# Patient Record
Sex: Male | Born: 1945
Health system: Southern US, Community
[De-identification: ages and names within clinical notes are randomized; demographics above are authoritative.]

## PROBLEM LIST (undated history)

## (undated) DIAGNOSIS — K219 Gastro-esophageal reflux disease without esophagitis: Secondary | ICD-10-CM

## (undated) DIAGNOSIS — I10 Essential (primary) hypertension: Secondary | ICD-10-CM

## (undated) DIAGNOSIS — M2540 Effusion, unspecified joint: Secondary | ICD-10-CM

## (undated) DIAGNOSIS — Z860100 Personal history of colon polyps, unspecified: Secondary | ICD-10-CM

## (undated) DIAGNOSIS — R531 Weakness: Secondary | ICD-10-CM

## (undated) DIAGNOSIS — I499 Cardiac arrhythmia, unspecified: Secondary | ICD-10-CM

## (undated) DIAGNOSIS — Z9889 Other specified postprocedural states: Secondary | ICD-10-CM

## (undated) DIAGNOSIS — E785 Hyperlipidemia, unspecified: Secondary | ICD-10-CM

## (undated) DIAGNOSIS — K5900 Constipation, unspecified: Secondary | ICD-10-CM

## (undated) DIAGNOSIS — K5790 Diverticulosis of intestine, part unspecified, without perforation or abscess without bleeding: Secondary | ICD-10-CM

## (undated) DIAGNOSIS — L853 Xerosis cutis: Secondary | ICD-10-CM

## (undated) DIAGNOSIS — Z9289 Personal history of other medical treatment: Secondary | ICD-10-CM

## (undated) DIAGNOSIS — Z8719 Personal history of other diseases of the digestive system: Secondary | ICD-10-CM

## (undated) DIAGNOSIS — F419 Anxiety disorder, unspecified: Secondary | ICD-10-CM

## (undated) DIAGNOSIS — M2550 Pain in unspecified joint: Secondary | ICD-10-CM

## (undated) DIAGNOSIS — R112 Nausea with vomiting, unspecified: Secondary | ICD-10-CM

## (undated) DIAGNOSIS — E271 Primary adrenocortical insufficiency: Secondary | ICD-10-CM

## (undated) DIAGNOSIS — M069 Rheumatoid arthritis, unspecified: Secondary | ICD-10-CM

## (undated) HISTORY — PX: COLONOSCOPY WITH ESOPHAGOGASTRODUODENOSCOPY (EGD) AND ESOPHAGEAL DILATION (ED): SHX6495

## (undated) HISTORY — PX: TOTAL HIP ARTHROPLASTY: SHX124

## (undated) HISTORY — PX: EYE SURGERY: SHX253

## (undated) HISTORY — PX: COLONOSCOPY: SHX174

---

## 1977-04-24 HISTORY — PX: VASECTOMY: SHX75

## 1982-04-24 HISTORY — PX: TOTAL KNEE ARTHROPLASTY: SHX125

## 1999-08-16 ENCOUNTER — Encounter: Admission: RE | Admit: 1999-08-16 | Discharge: 1999-08-16 | Payer: Self-pay | Admitting: Internal Medicine

## 1999-08-16 ENCOUNTER — Encounter: Payer: Self-pay | Admitting: Internal Medicine

## 1999-10-23 ENCOUNTER — Emergency Department (HOSPITAL_COMMUNITY): Admission: EM | Admit: 1999-10-23 | Discharge: 1999-10-24 | Payer: Self-pay | Admitting: Emergency Medicine

## 1999-10-23 ENCOUNTER — Encounter: Payer: Self-pay | Admitting: Emergency Medicine

## 1999-12-15 ENCOUNTER — Inpatient Hospital Stay (HOSPITAL_COMMUNITY): Admission: RE | Admit: 1999-12-15 | Discharge: 1999-12-18 | Payer: Self-pay | Admitting: Internal Medicine

## 2001-04-03 ENCOUNTER — Inpatient Hospital Stay (HOSPITAL_COMMUNITY): Admission: EM | Admit: 2001-04-03 | Discharge: 2001-04-05 | Payer: Self-pay | Admitting: Emergency Medicine

## 2001-04-03 ENCOUNTER — Encounter: Payer: Self-pay | Admitting: Emergency Medicine

## 2001-07-20 ENCOUNTER — Emergency Department (HOSPITAL_COMMUNITY): Admission: EM | Admit: 2001-07-20 | Discharge: 2001-07-20 | Payer: Self-pay | Admitting: Emergency Medicine

## 2001-07-20 ENCOUNTER — Encounter: Payer: Self-pay | Admitting: Emergency Medicine

## 2002-11-04 ENCOUNTER — Encounter: Admission: RE | Admit: 2002-11-04 | Discharge: 2002-11-04 | Payer: Self-pay | Admitting: Internal Medicine

## 2002-11-04 ENCOUNTER — Encounter: Payer: Self-pay | Admitting: Internal Medicine

## 2002-12-03 ENCOUNTER — Encounter: Admission: RE | Admit: 2002-12-03 | Discharge: 2002-12-03 | Payer: Self-pay | Admitting: Internal Medicine

## 2002-12-03 ENCOUNTER — Encounter: Payer: Self-pay | Admitting: Internal Medicine

## 2002-12-11 ENCOUNTER — Encounter: Payer: Self-pay | Admitting: Emergency Medicine

## 2002-12-12 ENCOUNTER — Inpatient Hospital Stay (HOSPITAL_COMMUNITY): Admission: EM | Admit: 2002-12-12 | Discharge: 2002-12-13 | Payer: Self-pay | Admitting: Emergency Medicine

## 2003-03-28 ENCOUNTER — Inpatient Hospital Stay (HOSPITAL_COMMUNITY): Admission: EM | Admit: 2003-03-28 | Discharge: 2003-04-01 | Payer: Self-pay | Admitting: Emergency Medicine

## 2004-05-24 ENCOUNTER — Emergency Department (HOSPITAL_COMMUNITY): Admission: EM | Admit: 2004-05-24 | Discharge: 2004-05-24 | Payer: Self-pay | Admitting: Emergency Medicine

## 2004-06-11 ENCOUNTER — Ambulatory Visit (HOSPITAL_COMMUNITY): Admission: RE | Admit: 2004-06-11 | Discharge: 2004-06-11 | Payer: Self-pay | Admitting: Internal Medicine

## 2004-06-12 ENCOUNTER — Emergency Department (HOSPITAL_COMMUNITY): Admission: EM | Admit: 2004-06-12 | Discharge: 2004-06-12 | Payer: Self-pay | Admitting: Emergency Medicine

## 2004-07-11 ENCOUNTER — Emergency Department (HOSPITAL_COMMUNITY): Admission: EM | Admit: 2004-07-11 | Discharge: 2004-07-11 | Payer: Self-pay | Admitting: Emergency Medicine

## 2004-08-03 ENCOUNTER — Ambulatory Visit (HOSPITAL_COMMUNITY): Admission: RE | Admit: 2004-08-03 | Discharge: 2004-08-03 | Payer: Self-pay | Admitting: Internal Medicine

## 2007-11-13 ENCOUNTER — Encounter: Admission: RE | Admit: 2007-11-13 | Discharge: 2007-11-13 | Payer: Self-pay | Admitting: Internal Medicine

## 2008-07-09 ENCOUNTER — Emergency Department (HOSPITAL_COMMUNITY): Admission: EM | Admit: 2008-07-09 | Discharge: 2008-07-09 | Payer: Self-pay | Admitting: Emergency Medicine

## 2010-05-15 ENCOUNTER — Encounter: Payer: Self-pay | Admitting: Internal Medicine

## 2010-09-09 NOTE — Discharge Summary (Signed)
NAME:  John, Lyons                          ACCOUNT NO.:  [id number]   MEDICAL RECORD NO.:  [id number]                   PATIENT TYPE:  INP   LOCATION:  0484                                 FACILITY:  [facility]   PHYSICIAN:  John Lyons, M.D.                DATE OF BIRTH:  06-16-1945   DATE OF ADMISSION:  12/11/2002  DATE OF DISCHARGE:  12/13/2002                                 DISCHARGE SUMMARY   DISCHARGE DIAGNOSES:  1. Nausea and vomiting, possibly due to a viral syndrome.  2. Significant hyponatremia requiring admission, corrected at time of     discharge.  3. Long-standing rheumatoid arthritis.  4. History of osteoporosis.  5. History of diverticulosis.  6. History of multiple small gallstones diagnosed by ultrasound in July     2004, with no evidence of this contributing to his difficulties at this     time.  7. Hypertension.  8. Past history of episodes of vomiting requiring Reglan treatment and     proton pump inhibitor in the past.   PROCEDURE:  None.   DISCHARGE MEDICATIONS:  John Lyons is to resume all of his previous home  medications with no new medications at the time of discharge.  This includes  Plaquenil 200 mg daily, digoxin 0.125 mg daily, Xanax 0.25 mg as needed for  anxiety or insomnia, Celebrex 200 mg daily, multivitamins daily, Caltrate  with D one tablet t.i.d., Aciphex 20 mg daily, folate 1 mg daily, Diovan 80  mg daily, Nasacort 2 sprays each nostril daily.   HISTORY OF PRESENT ILLNESS:  John Lyons is a pleasant 65 year old male who  complained of rhinitis for several days.  He developed nausea and vomiting  on December 09, 2002.  He developed persistent nausea and vomiting that did  not remit.  On December 10, 2002, he was called in Zithromax and Robitussin  for possible sinus infection; however, he continued to have nausea and  vomiting.  He denied any fever, constipation, diarrhea, or abdominal pain.  He did have a mild headache.  On presentation to  the emergency room, he was  found to be significantly hyponatremic with a sodium of 117.  He was  therefore admitted for further treatment.   HOSPITAL COURSE:  John Lyons was admitted.  He was hydrated gently with  intravenous normal saline.  He was treated with intravenous Zofran as needed  for nausea and vomiting.  He was placed on IV proton pump inhibitor therapy.  By later in the day on December 12, 2002, he had felt significantly improved.  He had no recurrent episodes of nausea and vomiting.  By December 13, 2002, he  was deemed stable for discharge home.   DISCHARGE PHYSICAL EXAMINATION:  VITAL SIGNS:  The patient's vital signs  were stable.  ABDOMEN:  Benign.  GENERAL:  He was in no acute distress.   LABORATORY DATA:  On  December 13, 2002, his white count was 2.8, hemoglobin  14.3, platelet count 258,000, with 53% segs, 32% lymphocytes, 12% monocytes.  On December 13, 2002, sodium had increased to 130, potassium 4.2, chloride  100, CO2 of 26, BUN 2, creatinine 0.5, glucose 86, calcium 8.6, total  protein 5.8, albumin 3.4.  AST 19, ALT 15, alkaline phosphatase 56, total  bilirubin 1.1.  Amylase was normal on admission at 89.  Troponin-I was  normal at 0.01 on admission.  Acute abdominal series was negative upon  admission as well.   DISCHARGE INSTRUCTIONS:  John Lyons is to call if he has any recurrent  problems, and continue his routine followup with [place] and his rheumatologist.                                                Loraine Leriche A. Waynard Edwards, M.D.    MAP/MEDQ  D:  12/16/2002  T:  12/16/2002  Job:  [id number]   cc:   Lemmie Evens, M.D.  [address]  [place]  [place] [address]  Fax: [phone]

## 2010-09-09 NOTE — Discharge Summary (Signed)
[facility]  Patient:    John Lyons, John Lyons Visit Number: [id number] MRN: [id number]          Service Type: MED Location: 3W 0345 02 Attending Physician:  Ezequiel Kayser Dictated by:   Rodrigo Ran, M.D. Admit Date:  04/03/2001 Discharge Date: 04/05/2001   CC:         Lemmie Evens, M.D.   Discharge Summary  DISCHARGE DIAGNOSES: 1. Acute sinusitis with possible associated bronchitis. 2. Persistent nausea, vomiting, and dehydration with some component of    gastroparesis. 3. Long-standing rheumatoid arthritis. 4. History of diverticulosis. 5. Osteoporosis with history of intolerance of dysphosphonates. 6. Hyponatremia, resolving at the time of discharge. 7. Headache, improving at the time of discharge.  PROCEDURES:  None.  DISCHARGE MEDICATIONS:  1. Aciphex 20 mg p.o. q.h.s.  2. Lanoxin 0.25 mg one q.d. as before.  3. Xanax 0.5 mg 1/2 to 1 tablet q.h.s. p.r.n.  4. Celebrex 200 mg q.d.  5. Plaquenil 200 mg q.d.  6. Multivitamin q.d.  7. Vitamin E q.d.  8. Calcium with vitamin D q.d.  9. Tequin 400 mg q.d. x 5 further days. 10. Reglan 10 mg before each meal t.i.d. total. 11. Tylenol 500 mg one or two q.8h. p.r.n. 12. Phenergan suppositories p.r.n.  HISTORY OF PRESENT ILLNESS:  [name] is a very pleasant 65 year old male with long-standing rheumatoid arthritis and history of an admission approximately one year ago for nausea and vomiting with gastroparesis type symptoms.  He has done well over the last year, and did not require regular use of Reglan. However, two weeks prior to admission he developed fever, cough, congestion, had some weakness with mild shortness of breath.  He was treated with 10 days of Augmentin with some significant improvement.  However, 48 hours prior to admission his symptoms returned with recurrent cough, and then 24 hours prior to admission he developed significant nausea and vomiting with multiple episodes of  vomiting.  He presented to the emergency room and was found to be clinically dehydrated and hyponatremic, and required admission.  HOSPITAL COURSE:  [name] was admitted to a regular hospital bed.  He was treated empirically with intravenous Tequin for a possible bronchitis or sinusitis as his initial chest x-ray did not show any acute disease.  He was also treated with intravenous Reglan and Phenergan p.r.n., and was placed on a clear liquid diet and was hydrated with normal saline.  [name] progressed well, and by the second day of hospitalization he was starting to take some oral intake.  His sodium improved to 129 from admission of 121, by the second day of hospitalization.  He did have some headache which was treated with Tylenol.  By 04/05/01, [name] was taken p.o., he was ambulating, his headache had decreased, and he was deemed stable for discharge home for continued outpatient management.  DISCHARGE PHYSICAL EXAMINATION:  VITAL SIGNS:  Temperature max was 100.1, pulse 86, respiratory rate 20, blood pressure 95/55, 95% saturation on room air.  GENERAL:  He was in bed semisupine in no acute distress.  HEENT:  Pupils are equal, round and reactive to light.  Extraocular movements intact.  There was no icterus.  LUNGS:  Clear to auscultation bilaterally.  HEART:  Regular with no murmurs, rubs, or gallops.  There was no edema.  ABDOMEN:  Soft and nontender.  LABORATORY DATA:  From 04/04/01, showed a sodium of 129, potassium 3.4, chloride 101, CO2 21, BUN 3, creatinine 0.5, glucose 76,  calcium 8.3, phosphorus 3.3, magnesium 1.6, and digoxin level of 0.2.  ACTIVITY:  [name] is to be up as tolerated.  DIET:  Regular.  He is to call if he has any recurrent problems.  FOLLOWUP:  He is to call our office for a post hospitalization followup in four weeks. Dictated by:   Rodrigo Ran, M.D. Attending Physician:  Ezequiel Kayser DD:  04/05/01 TD:  04/05/01 Job:  [id number]

## 2010-09-09 NOTE — H&P (Signed)
[facility]  Patient:    John Lyons, John Lyons Visit Number: [id number] MRN: [id number]          Service Type: MED Location: 1E 0105 01 Attending Physician:  Osvaldo Human Dictated by:   Rodrigo Ran, M.D. Admit Date:  04/03/2001   CC:         Lemmie Evens, M.D.   History and Physical  RHEUMATOLOGIST:  Lemmie Evens, M.D.  CHIEF COMPLAINT:  Fever, cough, nausea, vomiting, and weakness.  HISTORY OF PRESENT ILLNESS:  John Lyons is a 65 year old male with longstanding rheumatoid arthritis and history significant for an admission approximately one year ago for nausea and vomiting with some associated gastroparesis. However, he has done well over the last year and has had no problems even without the need for regular use of Reglan.  Approximately two weeks ago, he developed a respiratory illness with fever, cough, head congestion, some weakness, and mild shortness of breath.  He was treated with 10 days of Augmentin with significant improvement.  However, his symptoms returned in the last 48 hours with recurrent cough.  Over the last 24 hours, he has developed significant nausea and multiple episodes of vomiting of bilious material.  He denies any blood from above or below.  He denies any chest pains or shortness of breath currently.  He did have temperatures up to 99.9 degrees last week, but none in the last few days.  Some Phenergan was called in yesterday, however, he felt more weak and presented to the emergency room early this morning.  He is noted to have signs of dehydration with a serum sodium of 121 and will require admission for further treatment.  PAST MEDICAL HISTORY: 1. Rheumatoid arthritis since age 35 with a history of bilateral hip    replacement and left knee replacement. 2. Admission in August of 2001 for nausea, vomiting, and probable    gastroparesis. 3. History of diverticulosis. 4. Osteoporosis, but intolerance of  bisphosphonates due to GI upset.  ALLERGIES:  CODEINE, PHENOBARBITAL, ZYRTEC dries him out, and ACTONEL and FOSAMAX cause GI upset.  MEDICATIONS IN THE EMERGENCY ROOM:  IV Phenergan and 1000 cc of normal saline.  HOME MEDICATIONS: 1. Aciphex 20 mg q.h.s. 2. Lanoxin 0.25 mg q.d. 3. Xanax 0.5 mg one-half to one tablet q.h.s. 4. Celebrex 200 mg q.d. 5. Plaquenil 200 mg q.d. 6. Augmentin which was completed two days prior to admission. 7. Multivitamin daily. 8. Vitamin E and calcium with D usually one or two tablets a day.  SOCIAL HISTORY:  He has been married since 52.  He has one daughter.  He works for [employer].  Despite his medical issues, he has always worked very Writer.  He has no history of tobacco, alcohol, or drug use history.  FAMILY HISTORY:  His father died at age 53 of possible colon cancer.   His mother died at age 50 of diabetes, coronary disease, and an MI.  He has one living brother.  He has two half-sisters who are alive.  His daughter is in good health.  REVIEW OF SYSTEMS:  Nausea for one week, but worse over the last two days. Furthermore as per the history of present illness.  He did have two episodes of diarrhea on Monday, but none yesterday.  PHYSICAL EXAMINATION:  Temperature 99.5 degrees, blood pressure 122/68, pulse 78, respiratory rate 20.  GENERAL APPEARANCE:  He is lying supine in no acute distress.  He appears a little pale  and weak.  NECK:  There is no JVD.  HEENT:  The mucosa is dry.  LUNGS:  There is some wheeze and crackle in the right base on inspiration, but otherwise the lung fields aer clear.  HEART:  Regular with no murmurs, rubs, or gallops.  ABDOMEN:  Soft, nontender, and nondistended with no mass or hepatosplenomegaly.  EXTREMITIES:  There is no peripheral edema.  NEUROLOGIC:  He is alert and oriented and does move all extremities x 4.  He is grossly neurologically intact.  LABORATORY  DATA:  The urinalysis is negative.  The white count is 8.4 with 83% segs, 11% lymphocytes, 6% monocytes, hemoglobin 14.0, and platelet count 401,000.  The lipase is 21.  The sodium is 121, potassium 3.8, chloride 90, CO2 19, BUN 11, creatinine 0.5, glucose 75, and calcium 9.2. LFTs are within normal limits.  The chest x-ray is reviewed and shows no acute disease.  ASSESSMENT AND PLAN:  A 65 year old male nausea and vomiting with bronchitis versus an early community-acquired pneumonia and subsequent hyponatremia.  He may be exhibiting some signs of gastroparesis at this time.  We will admit, hydrate with normal saline, treat with IV Reglan, Protonix, and Phenergan.  He may have a clear liquid diet.  We will follow up a digoxin level. Furthermore, we will place him on Tequin empirically and follow his fever curve and vital signs. Dictated by:   Rodrigo Ran, M.D. Attending Physician:  Osvaldo Human DD:  04/03/01 TD:  04/03/01 Job: [id number]

## 2010-09-09 NOTE — H&P (Signed)
NAME:  John Lyons, John Lyons                          ACCOUNT NO.:  [id number]   MEDICAL RECORD NO.:  [id number]                   PATIENT TYPE:  INP   LOCATION:  0484                                 FACILITY:  [facility]   PHYSICIAN:  Barry Dienes. Eloise Harman, M.D.            DATE OF BIRTH:  1945-09-18   DATE OF ADMISSION:  12/11/2002  DATE OF DISCHARGE:                                HISTORY & PHYSICAL   CHIEF COMPLAINT:  Nausea and vomiting.   HISTORY OF PRESENT ILLNESS:  The patient is a 65 year old white male with a  complaint of rhinitis for several days, associated with nausea and vomiting  on December 09, 2002.  Since that time, he has had persistent nausea and  vomiting.  On December 10, 2002, Zithromax was called in with Robitussin for a  presumed sinus infection.  However, he continued to have nausea and  vomiting.  His last regular meal was on December 09, 2002.  He denies recent  fever, constipation, diarrhea, abdominal pain or dysuria.  For the past few  days, he has had a moderately severe headache described as in the right  periorbital region that is somewhat pulsating and intensity 5/10.  The  headache has lasted approximately one day and has not been associated with  vision changes.   PAST MEDICAL HISTORY:  1. Rheumatoid arthritis since age 65 years.  2. August 2001, persistent nausea and vomiting that led to Reglan treatment     with a proton pump inhibitor.  3. Diverticulosis.  4. Osteoporosis.  5. July 2004, ultrasound showing multiple small gallstones without biliary     duct dilatation.  6. Hypertension.  7. Paroxysmal atrial fibrillation.   MEDICATIONS PRIOR TO ADMISSION:  1. Plaquenil 200 mg p.o. daily.  2. Digoxin 0.125 mg p.o. daily.  3. Xanax 0.25 mg p.o. daily p.r.n. anxiety or sleep.  4. Celebrex 200 mg p.o. daily.  5. Multivitamins -- one tab p.o. daily.  6. Caltrate-D 600 -- one tab p.o. t.i.d.  7. Aciphex 20 mg p.o. daily.  8. Folate 1 mg p.o. daily.  9. Diovan  80 mg p.o. daily.  10.      Nasacort two sprays, each nostril, once daily.   Of note, he has not had prednisone in several years.   ALLERGIES:  PHENOBARBITAL has been associated with a rash, ACTONEL and  FOSAMAX have been associated with GI irritation, ALLEGRA has been associated  with prostate irritation and LISINOPRIL has been noted to cause a dry cough.   PAST SURGICAL HISTORY:  1. Bilateral total hip replacements.  2. Left total knee replacement.  3. Vasectomy.   FAMILY HISTORY:  Father died at age 50 of complications of depression.  Mother died at age 524 of complications of diabetes mellitus.  He has one  brother and two sisters who are alive and well.  There are no close family  members who have had colon  cancer.   SOCIAL HISTORY:  He is married and has one daughter.  He has no history of  tobacco or alcohol abuse.  He is currently working in [employer], site.   REVIEW OF SYSTEMS:  Most significant for a mild right frontal headache  associated with photophobia.  He also complains of a dry cough recently  without fever and arthralgias affecting his right hip and wrist.  He denies  recent vision change, shortness of breath, chest pain, abdominal pain,  constipation, or diarrhea.   INITIAL PHYSICAL EXAMINATION:  VITAL SIGNS:  Blood pressure 139/83, pulse  93, respiratory rate 20, temperature 99.1, 100% pulse oxygen saturation on  room air.  GENERAL:  In general, he is a chronically ill-appearing white man who held a  towel over his head due to his photophobia.  NECK:  Neck was supple with a full range of motion.  HEENT:  Exam showed pupils equal, round and reactive to light and  accommodation.  Extraocular movements intact with no papilledema.  CHEST:  Chest was clear to auscultation.  HEART:  Heart had a regular rate and rhythm.  S1 and S2 are present without  murmur, gallop, or rub.  ABDOMEN:  Abdomen had normal bowel sounds with no hepatosplenomegaly or   tenderness.  RECTAL:  Rectal exam had a small amount of soft stool in the rectal vault.  EXTREMITIES:  Extremities were without cyanosis, clubbing or edema.  NEUROLOGICAL:  He is alert and oriented x3.  He answers questions well and  moves his four extremities normally.   LABORATORY STUDIES:  Acute abdomen series x-rays were within normal limits  with no evidence of ileus or bowel obstruction.   Serum sodium was 117, potassium 3.4, chloride 84, CO2 25, BUN 4, creatinine  0.4, glucose 93, serum albumin 4.1, SGOT 27, SGPT 15, serum amylase 85,  alkaline phosphatase 68, total bilirubin 1.4.   IMPRESSION AND PLAN:  1. Nausea:  This is likely due to moderately severe hyponatremia.     Alternatively, the hyponatremia could be secondary to persistent nausea     and vomiting with low intravascular fluid volume.  His symptoms could be     due to early biliary tract obstruction due to known cholelithiasis.     However, his lack of right upper quadrant tenderness and lack of worsened     symptoms with food intake argue against biliary tract disease.  I plan to     continue proton pump inhibitor treatment, switching to the intravenous     route and use regular dosing of Zofran intravenously to diminish his     nausea.  We will slowly replete his sodium via the intravenous route.  2. Headache:  This is of unclear cause and without focal neurologic     deficits.  The symptoms could be due to acute     sinusitis.  I plan to initiate sinus decongestant therapy and if his     fever persists, to add in an antibiotic specific for sinusitis.  Midrin     will be tried to attempt to resolve his headache.  3. Rheumatoid arthritis:  Stable on his current medical regimen.                                               Barry Dienes Eloise Harman, M.D.    DGP/MEDQ  D:  12/12/2002  T:  12/12/2002  Job:  [id number]   cc:   Loraine Leriche A. Waynard Edwards, M.D.  [address]  [place]  [place] [address]  Fax: [phone]   Lemmie Evens, M.D.  [address]  [place]  [place] [address]  Fax: [phone]   Dr. Eber Jones

## 2010-09-09 NOTE — Discharge Summary (Signed)
[facility]  Patient:    John Lyons, John Lyons                       MRN: [id number] Adm. Date:  [id number] Disc. Date: [id number] Attending:  Ezequiel Kayser CC:         Rodrigo Ran, M.D.   Discharge Summary  PERTINENT FINDINGS:  The patient is a 65 year old white male with a long history of rheumatoid arthritis, osteoarthritis, and gastroesophageal reflux disease who was admitted with intermittent headaches associated with multiple episodes of vomiting associated with nausea.  ADMISSION PHYSICAL EXAMINATION:  VITAL SIGNS:  Blood pressure 152/72, pulse 68, respiratory rate 22, temperature 98.  GENERAL:  He is a thin white male who was in no acute distress, alert and oriented x 4.  HEENT:  Within normal limits.  There was no evidence of papilledema.  NECK:  Supple with a full range of motion.  There was no jugular venous distension or carotid bruit.  CHEST:  Clear to auscultation.  HEART: Regular rate and rhythm.  S1, S2, without murmur, gallop, or rub.  ABDOMEN:  Normal bowel sounds with no hepatosplenomegaly or tenderness.  LABORATORY DATA:  Serum sodium 134, potassium 4.9, chloride 100, CO2 27, BUN 10, creatinine 0.5, glucose 88.  Total bilirubin 1.8, alkaline phosphatase 55, AST 26.  Magnesium level 1.6.  HOSPITAL COURSE:  The patient was admitted to a medical ward and was started on IV fluids for dehydration with Reglan for presumed gastroesophageal reflux. He did well with rapid resolution of his nausea and vomiting.  However, on the evening prior to discharge, he became somewhat figity.  He notes that in the past he did not tolerate Reglan well.  His dysphoria resolved with low-dose Xanax treatment.  PROCEDURES:  None.  CONSULTATIONS: None  CONDITION ON DISCHARGE:  The patient feels at his baseline with no further episodes of nausea or vomiting.  He has been eating well in the hospital.  DISCHARGE DIAGNOSES: 1. Nausea and vomiting,  likely secondary to gastroesophageal reflux disease. 2. Rheumatoid arthritis. 3. Headaches. 4. History of tachycardia. 5. Osteoarthritis of the knees.  DISCHARGE MEDICATIONS: 1. Zantac 150 mg p.o. b.i.d. will be on hold while he receives Aciphex 20 mg    p.o. q.d. for 14 days. 2. Phenergan suppositories 25 mg p.r. q.i.d. p.r.n. nausea. 3. Digoxin 0.125 mg p.o. q.d. 4. Xanax 0.25 mg p.o. b.i.d. p.r.n. anxiety. 5. Tylenol as needed for pain.  DISPOSITION AND FOLLOWUP:  The patient will call Dr. Waynard Edwards early next week to arrange a followup evaluation at [facility] within one to two weeks following discharge. DD:  12/18/99 TD:  12/19/99 Job: [id number]

## 2010-09-09 NOTE — H&P (Signed)
NAME:  John Lyons, John Lyons                          ACCOUNT NO.:  [id number]   MEDICAL RECORD NO.:  [id number]                   PATIENT TYPE:  INP   LOCATION:  0110                                 FACILITY:  [facility]   PHYSICIAN:  Larina Earthly, M.D.                     DATE OF BIRTH:  08/09/1945   DATE OF ADMISSION:  03/28/2003  DATE OF DISCHARGE:                                HISTORY & PHYSICAL   CHIEF COMPLAINT:  Nausea and vomiting with headache.   HISTORY OF PRESENT ILLNESS:  This is a 65 year old Caucasian male who is a  patient of Dr. Rodrigo Ran who has a past medical history significant for  rheumatoid arthritis, osteoporosis, seasonal allergic rhinitis,  cholelithiasis, and hypertension, who has been admitted in August 2004 for  hyponatremia associated with nausea and vomiting.  At that time hyponatremia  and nausea along with associated headache resolved after conservative  management with intravenous fluids consisting of normal saline, intravenous  proton pump inhibitors, and antiemetics. The etiology was unclear, but  possibly secondary to viral syndrome. Please see history and physical and  discharge summary for extensive details which are similar to this same  presentation today.   The patient's wife states that he presented to see Dr. Darcus Austin on  approximately March 23, 2003, with ear pain and headache. This was  treated with external eardrops and Tylenol with some relief. On March 26, 2003, the patient began experiencing nausea without vomiting. However, he  continued to eat and then this morning the patient experienced two episodes  of vomiting of food and/or bilious material without any blood. The patient  denies any fevers, chills, change in vision, worsening seasonal allergic  rhinitis symptoms, chest pain, shortness of breath, abdominal pain, change  in bowel habits, or new musculoskeletal or neurological deficits. The  patient and wife presented to the emergency  department where his sodium was  found to be 117. Emergency room  physician gave him a 500 cc fluid bolus of  normal saline along with continuation of IV fluids at 100 cc an hour of  normal saline. The patient was also administered Dilaudid and Phenergan, and  soon thereafter the patient's nausea and headache resolved, and he felt  better. The patient is now subsequently admitted for his significant  hyponatremia for observation.   PAST MEDICAL HISTORY:  1. Rheumatoid arthritis since the age of 1.  2. History of persistent nausea and vomiting treated with Reglan and proton     pump inhibitor.  3. Diverticulosis.  4. Osteoporosis.  5. Cholelithiasis without biliary duct dilatation in July 2004 by     ultrasound.  6. Hypertension.  7. Paroxysmal atrial fibrillation.   CURRENT MEDICATIONS:  1. Lanoxin 0.125 mg p.o. daily.  2. Aciphex 20 mg p.o. daily.  3. Xanax 0.25 mg p.o. daily.  4. Celebrex 200 mg p.o. daily.  5. Plaquenil  200 mg p.o. daily.  6. Diovan 80 mg p.o. daily.  7. Folic acid 1 mg p.o. daily.  8. Calcium with vitamin D t.i.d.  9. Nasacort nasal spray, two sprays each nostril daily.   ALLERGIES:  PHENOBARBITAL, ACTONEL, AND FOSAMAX have been associated with GI  irritation. DECONGESTANTS have been associated with urinary retention. ACE  inhibitor induces a dry cough.   FAMILY HISTORY:  Significant for depression, diabetes mellitus.   SOCIAL HISTORY:  The patient is married, has one daughter. Currently works  for [employer]. Has no history of tobacco or alcohol abuse.   PHYSICAL EXAMINATION:  GENERAL: This is a Caucasian male with general  malaise, lying on a hospital gurney, alert and oriented times three,  following commands, and responsive.  VITAL SIGNS: Temperature 97.3 degrees Fahrenheit, pulse 74, respirations 16,  blood pressure 136/74.  HEENT:  Sclerae anicteric. Extraocular movements are intact. There are no  oropharyngeal lesions.  Tympanic  membranes are clear bilaterally. There is  no evidence of infection.  NECK: Supple. There is no cervical lymphadenopathy. There are no carotid  bruits. There is no thyromegaly.  LUNGS: Clear to auscultation bilaterally.  CARDIOVASCULAR: Regular rate and rhythm without murmurs, rubs, or gallops.  ABDOMEN: Soft, nontender, and nondistended abdomen. Bowel sounds are  present. There is no hepatosplenomegaly.  EXTREMITIES: No edema.  Pedal pulses are intact.  NEUROLOGIC: The patient is alert and oriented times three, and is grossly  nonfocal. There is no active synovitis on exam.   LABORATORY EVALUATION:  White blood cell count 6.3, hemoglobin 15,  hematocrit 43%, platelet count 258,000. Sodium 117, potassium 4.1, BUN 7,  creatinine 0.5, carbon dioxide 24, AST 26, ALT 20, alkaline phosphatase 59,  total bilirubin 0.8, albumin 4.1.   IMPRESSION AND PLAN:  1. Hyponatremia of unclear etiology.  Possibly related to nausea, vomiting,     and decreased oral intake secondary to the same. Will replete with     intravenous fluids and recheck. Given the amount of normal saline that     the patient has already received, will check a BMET at this time to     prevent escalation of sodium levels that may be detrimental. Will also     check a TSH to rule out hypothyroidism. Clinically, the patient is     improved, albeit that is after the administration of Dilaudid and     Phenergan. Previously, his history of cholelithiasis has not been an     issue with respect to potentially causing his nausea and vomiting. Liver     function tests are currently unremarkable.  Examination is unremarkable     for right upper quadrant tenderness or mass. We will continue proton pump     inhibitor intravenously and follow clinically.  2. Nausea and vomiting, currently asymptomatic. Will continue Phenergan as     needed and replete electrolytes as indicated. 3. Seasonal allergic rhinitis. Will continue nasal steroids and  monitor for     stability.                                               Larina Earthly, M.D.    RA/MEDQ  D:  03/28/2003  T:  03/28/2003  Job:  [id number]   cc:   Loraine Leriche A. Waynard Edwards, M.D.  [address]  [place]  [place] [address]  Fax: [phone]

## 2010-09-09 NOTE — Discharge Summary (Signed)
NAME:  John Lyons, John Lyons                          ACCOUNT NO.:  [id number]   MEDICAL RECORD NO.:  [id number]                   PATIENT TYPE:  INP   LOCATION:  0349                                 FACILITY:  [facility]   PHYSICIAN:  Mark A. Perini, M.D.                DATE OF BIRTH:  05-18-1945   DATE OF ADMISSION:  03/28/2003  DATE OF DISCHARGE:  04/01/2003                                 DISCHARGE SUMMARY   DISCHARGE DIAGNOSES:  1. Nausea and vomiting and headache with significant hyponatremia.  2. Syndrome of inappropriate antidiuretic hormone.  The cause of this is not     clear at this time, but this has probably been present for some time.  3. Rheumatoid arthritis since the age of 1.  4. Hypertension.  5. History of osteoporosis.  6. Diverticulosis.  7. History of cholelithiasis without biliary duct dilatation.  8. Remote history of paroxysmal atrial fibrillation.   PROCEDURES:  MRI of the brain on March 31, 2003, showing slight thickening  and enhancement of the pituitary infundibulum.  Although it is possible that  this is normal, given the clinical presentation abnormalities should be  considered as other possibilities.  An abnormal finding might be considered  as well, such as sarcoidosis.  Minimal nonspecific white matter-type changes  in the left frontal lobe.  This evaluation was somewhat limited by metallic  artifact of the right calvarial region.  It was felt that SIADH is a very  uncommon presentation of sarcoidosis, and this will be followed up in the  future.   DISCHARGE MEDICATIONS:  The patient is to stop Diovan.  He is to continue  Zelnorm 6 mg once daily.  He is to resume all of his other medications  including Plaquenil.  He is also to continue Lanoxin, Aciphex, Xanax, folic  acid, calcium with D, and Nasacort as previously.   HISTORY OF PRESENT ILLNESS:  John Lyons is a pleasant 65 year old male  with a longstanding history of rheumatoid arthritis,  osteoporosis, allergic  rhinitis, cholelithiasis, and hypertension.  He has had several admissions  in the last two years for hyponatremia associated with nausea and vomiting.  It was felt on previous episodes that this might have been triggered by a  viral syndrome, but it has never been clear in the past.  He was seen in our  office on March 23, 2003, with ear pain and headache.  He was given some  external ear canal eardrops and Tylenol with some relief.  However, on  March 26, 2003, he started to develop nausea without vomiting.  He  continued to eat, and then on the morning of admission he had vomiting of  bilious material without any blood.  He denied any fevers or chills or  worsening of vision.  He denied any chest pain or shortness of breath.  In  the emergency room he was given saline and Dilaudid  and Phenergan.  Subsequently he was admitted for his significant hyponatremia which was 117  on admission.   HOSPITAL COURSE:  John Lyons was admitted and placed on a normal saline drip.  Over the next day or two he did begin to feel quite a bit better.  His  sodium levels gradually improved.  He did have simultaneous serum and urine  osmolarities checked which confirmed SIADH.  MRI scan was done with fairly  nonconclusive findings.  He did also have a random cortisol level done which  was normal.  He also had a normal Cortrosyn stimulation test.  By March 02, 2003, he was feeling better, and he was deemed stable for discharge home.   DISCHARGE PHYSICAL EXAM:  VITAL SIGNS:  Temperature 97.1, maximum  temperature 99.1, pulse 69, respiratory rate 24, blood sugars ranged from 93-  103, blood pressure was 145/80, 100% saturation on room air.  GENERAL:  He was alert and oriented.  LUNGS:  Clear to auscultation bilaterally.  HEART:  Regular rate and rhythm, with no murmur.  ABDOMEN:  Soft and nontender.   NOTABLE DISCHARGE LABORATORY DATA:  On April 01, 2003, white count was   3.4, hemoglobin 13.8, hematocrit 40.7, platelet count 351,000.  There were  59% segs, 30% lymphocytes, and 9% monocytes.  On April 01, 2003, sodium  was up to 132, potassium 4, chloride 99, CO2 29, glucose 108, BUN 3,  creatinine 0.5, calcium 9.  Liver function tests on admission were normal.  On March 30, 2003, urine osmolarity was 424 mOsm/kg with a serum  osmolarity of 227 mOsm/kg.  An AM cortisol on March 30, 2003, was 6.7, and  then AM cortisol on March 31, 2003, was 4.5.  After Cortrosyn stimulation  on March 31, 2003, the cortisol level rose to 16.4.  TSH was normal at  1.39 on March 29, 2003.  Urinalysis was normal on admission as well.   DISCHARGE INSTRUCTIONS:  John Lyons is to follow up in our office in one week.  He is to follow a free-water restriction to less than 1000 mL of free water  daily.  He will have further outpatient follow-up.                                               Mark A. Waynard Edwards, M.D.    MAP/MEDQ  D:  04/10/2003  T:  04/11/2003  Job:  [id number]   cc:   Lemmie Evens, M.D.  [address]  [place]  [place] [address]  Fax: [phone]

## 2011-05-03 DIAGNOSIS — E2749 Other adrenocortical insufficiency: Secondary | ICD-10-CM | POA: Diagnosis not present

## 2011-05-03 DIAGNOSIS — M069 Rheumatoid arthritis, unspecified: Secondary | ICD-10-CM | POA: Diagnosis not present

## 2011-05-03 DIAGNOSIS — M159 Polyosteoarthritis, unspecified: Secondary | ICD-10-CM | POA: Diagnosis not present

## 2011-06-20 DIAGNOSIS — E785 Hyperlipidemia, unspecified: Secondary | ICD-10-CM | POA: Diagnosis not present

## 2011-06-20 DIAGNOSIS — Z79899 Other long term (current) drug therapy: Secondary | ICD-10-CM | POA: Diagnosis not present

## 2011-08-18 DIAGNOSIS — S90569A Insect bite (nonvenomous), unspecified ankle, initial encounter: Secondary | ICD-10-CM | POA: Diagnosis not present

## 2011-09-20 DIAGNOSIS — M069 Rheumatoid arthritis, unspecified: Secondary | ICD-10-CM | POA: Diagnosis not present

## 2011-09-20 DIAGNOSIS — I10 Essential (primary) hypertension: Secondary | ICD-10-CM | POA: Diagnosis not present

## 2011-09-20 DIAGNOSIS — M810 Age-related osteoporosis without current pathological fracture: Secondary | ICD-10-CM | POA: Diagnosis not present

## 2011-09-20 DIAGNOSIS — E785 Hyperlipidemia, unspecified: Secondary | ICD-10-CM | POA: Diagnosis not present

## 2011-10-27 DIAGNOSIS — H52209 Unspecified astigmatism, unspecified eye: Secondary | ICD-10-CM | POA: Diagnosis not present

## 2011-10-27 DIAGNOSIS — Z961 Presence of intraocular lens: Secondary | ICD-10-CM | POA: Diagnosis not present

## 2011-10-27 DIAGNOSIS — H524 Presbyopia: Secondary | ICD-10-CM | POA: Diagnosis not present

## 2011-12-08 DIAGNOSIS — E2749 Other adrenocortical insufficiency: Secondary | ICD-10-CM | POA: Diagnosis not present

## 2011-12-08 DIAGNOSIS — M159 Polyosteoarthritis, unspecified: Secondary | ICD-10-CM | POA: Diagnosis not present

## 2011-12-08 DIAGNOSIS — M79609 Pain in unspecified limb: Secondary | ICD-10-CM | POA: Diagnosis not present

## 2011-12-08 DIAGNOSIS — M069 Rheumatoid arthritis, unspecified: Secondary | ICD-10-CM | POA: Diagnosis not present

## 2011-12-18 DIAGNOSIS — R059 Cough, unspecified: Secondary | ICD-10-CM | POA: Diagnosis not present

## 2011-12-18 DIAGNOSIS — M069 Rheumatoid arthritis, unspecified: Secondary | ICD-10-CM | POA: Diagnosis not present

## 2011-12-18 DIAGNOSIS — R05 Cough: Secondary | ICD-10-CM | POA: Diagnosis not present

## 2011-12-18 DIAGNOSIS — J309 Allergic rhinitis, unspecified: Secondary | ICD-10-CM | POA: Diagnosis not present

## 2011-12-19 DIAGNOSIS — M25569 Pain in unspecified knee: Secondary | ICD-10-CM | POA: Diagnosis not present

## 2012-01-03 DIAGNOSIS — I10 Essential (primary) hypertension: Secondary | ICD-10-CM | POA: Diagnosis not present

## 2012-01-03 DIAGNOSIS — E782 Mixed hyperlipidemia: Secondary | ICD-10-CM | POA: Diagnosis not present

## 2012-01-04 DIAGNOSIS — Z8249 Family history of ischemic heart disease and other diseases of the circulatory system: Secondary | ICD-10-CM | POA: Diagnosis not present

## 2012-01-04 DIAGNOSIS — E782 Mixed hyperlipidemia: Secondary | ICD-10-CM | POA: Diagnosis not present

## 2012-01-04 DIAGNOSIS — I10 Essential (primary) hypertension: Secondary | ICD-10-CM | POA: Diagnosis not present

## 2012-01-04 DIAGNOSIS — Z01810 Encounter for preprocedural cardiovascular examination: Secondary | ICD-10-CM | POA: Diagnosis not present

## 2012-01-04 HISTORY — PX: CARDIOVASCULAR STRESS TEST: SHX262

## 2012-01-12 DIAGNOSIS — Z23 Encounter for immunization: Secondary | ICD-10-CM | POA: Diagnosis not present

## 2012-01-26 ENCOUNTER — Other Ambulatory Visit: Payer: Self-pay | Admitting: Orthopedic Surgery

## 2012-01-26 DIAGNOSIS — I10 Essential (primary) hypertension: Secondary | ICD-10-CM | POA: Diagnosis not present

## 2012-01-26 DIAGNOSIS — R21 Rash and other nonspecific skin eruption: Secondary | ICD-10-CM | POA: Diagnosis not present

## 2012-01-26 DIAGNOSIS — M069 Rheumatoid arthritis, unspecified: Secondary | ICD-10-CM | POA: Diagnosis not present

## 2012-01-30 ENCOUNTER — Encounter (HOSPITAL_COMMUNITY): Payer: Self-pay | Admitting: Pharmacy Technician

## 2012-01-31 DIAGNOSIS — M25569 Pain in unspecified knee: Secondary | ICD-10-CM | POA: Diagnosis not present

## 2012-02-02 ENCOUNTER — Encounter (HOSPITAL_COMMUNITY)
Admission: RE | Admit: 2012-02-02 | Discharge: 2012-02-02 | Disposition: A | Discharge status: Home / Self Care | Payer: Medicare Other | Source: Ambulatory Visit | Attending: Orthopedic Surgery | Admitting: Orthopedic Surgery

## 2012-02-02 ENCOUNTER — Encounter (HOSPITAL_COMMUNITY): Payer: Self-pay

## 2012-02-02 DIAGNOSIS — J449 Chronic obstructive pulmonary disease, unspecified: Secondary | ICD-10-CM | POA: Diagnosis not present

## 2012-02-02 DIAGNOSIS — K449 Diaphragmatic hernia without obstruction or gangrene: Secondary | ICD-10-CM | POA: Diagnosis not present

## 2012-02-02 DIAGNOSIS — I10 Essential (primary) hypertension: Secondary | ICD-10-CM | POA: Diagnosis not present

## 2012-02-02 HISTORY — DX: Cardiac arrhythmia, unspecified: I49.9

## 2012-02-02 HISTORY — DX: Essential (primary) hypertension: I10

## 2012-02-02 HISTORY — DX: Gastro-esophageal reflux disease without esophagitis: K21.9

## 2012-02-02 HISTORY — DX: Primary adrenocortical insufficiency: E27.1

## 2012-02-02 HISTORY — DX: Nausea with vomiting, unspecified: R11.2

## 2012-02-02 HISTORY — DX: Personal history of other diseases of the digestive system: Z87.19

## 2012-02-02 HISTORY — DX: Rheumatoid arthritis, unspecified: M06.9

## 2012-02-02 HISTORY — DX: Other specified postprocedural states: Z98.890

## 2012-02-02 LAB — CBC WITH DIFFERENTIAL/PLATELET
Basophils Relative: 0 % (ref 0–1)
Eosinophils Absolute: 0.2 10*3/uL (ref 0.0–0.7)
Hemoglobin: 15.4 g/dL (ref 13.0–17.0)
MCH: 30.2 pg (ref 26.0–34.0)
MCHC: 35.6 g/dL (ref 30.0–36.0)
Monocytes Relative: 9 % (ref 3–12)
Neutrophils Relative %: 55 % (ref 43–77)
Platelets: 287 10*3/uL (ref 150–400)

## 2012-02-02 LAB — URINALYSIS, ROUTINE W REFLEX MICROSCOPIC
Nitrite: NEGATIVE
Specific Gravity, Urine: 1.016 (ref 1.005–1.030)
Urobilinogen, UA: 0.2 mg/dL (ref 0.0–1.0)

## 2012-02-02 LAB — URINE MICROSCOPIC-ADD ON

## 2012-02-02 LAB — COMPREHENSIVE METABOLIC PANEL
Albumin: 4.2 g/dL (ref 3.5–5.2)
Alkaline Phosphatase: 67 U/L (ref 39–117)
BUN: 9 mg/dL (ref 6–23)
Calcium: 9.9 mg/dL (ref 8.4–10.5)
Creatinine, Ser: 0.54 mg/dL (ref 0.50–1.35)
Potassium: 3.7 mEq/L (ref 3.5–5.1)
Total Protein: 6.9 g/dL (ref 6.0–8.3)

## 2012-02-02 LAB — SURGICAL PCR SCREEN
MRSA, PCR: NEGATIVE
Staphylococcus aureus: NEGATIVE

## 2012-02-02 LAB — PROTIME-INR
INR: 0.98 (ref 0.00–1.49)
Prothrombin Time: 12.9 seconds (ref 11.6–15.2)

## 2012-02-02 LAB — TYPE AND SCREEN: Antibody Screen: NEGATIVE

## 2012-02-02 NOTE — Progress Notes (Signed)
Chart left for anesthesia review of CXR.

## 2012-02-02 NOTE — Pre-Procedure Instructions (Signed)
20 John Lyons  02/02/2012   Your procedure is scheduled on:  Monday October 21  Report to [facility] at 5:30 AM.  Call this number if you have problems the morning of surgery: [phone]   Remember:   Do not eat or drink:After Midnight.    Take these medicines the morning of surgery with A SIP OF WATER: Digoxin, Florinef (fludrocortisone), Cortef (hydrocortisone), Plaquenil, Protonix (pantoprazole). May use eye drops and nasal spray. May take Tylenol.    Do not wear jewelry, make-up or nail polish.  Do not wear lotions, powders, or perfumes. You may wear deodorant.  Do not shave 48 hours prior to surgery. Men may shave face and neck.  Do not bring valuables to the hospital.  Contacts, dentures or bridgework may not be worn into surgery.  Leave suitcase in the car. After surgery it may be brought to your room.  For patients admitted to the hospital, checkout time is 11:00 AM the day of discharge.   Patients discharged the day of surgery will not be allowed to drive home.  Name and phone number of your driver: NA  Special Instructions: Incentive Spirometry - Practice and bring it with you on the day of surgery. Shower using CHG 2 nights before surgery and the night before surgery.  If you shower the day of surgery use CHG.  Use special wash - you have one bottle of CHG for all showers.  You should use approximately 1/3 of the bottle for each shower.   Please read over the following fact sheets that you were given: Pain Booklet, Coughing and Deep Breathing, Blood Transfusion Information, Total Joint Packet and Surgical Site Infection Prevention

## 2012-02-03 LAB — URINE CULTURE

## 2012-02-06 NOTE — Consult Note (Addendum)
Anesthesia Chart Review:   Patient is a 66 year old male scheduled for right TKA on 02/12/12 by Dr. Sherlean Foot.  History includes non-smoker, post-operative N/V, HTN, RA, GERD, hiatal hernia, Addison's disease on replacement therapy (steroids), "rapid heart rate" in the '70's.  PCP is Dr. Rodrigo Ran.  He sent patient to Cardiologist Dr. Allyson Sabal for a pre-operative evaluation who felt he could be cleared with low risk with PRN follow-up if he had a normal stress test--which he did.  I reviewed Addison's history with anesthesiologist Dr. Noreene Larsson.  Labs noted.  EKG on 01/03/12 [facility]) showed NSR.  Nuclear stress test on 01/04/12 [facility]) showed normal myocardial perfusion, post-stress EF 67%, no significant wall motion abnormalities.  Low risk scan.  CXR findings on 02/02/12 showed: Cardiac silhouette is normal size. There is ectasia of  thoracic aorta. Mediastinal and hilar contours appear stable.  Slight increased markings are present within the lingula. These most likely are fibrotic in origin. There is flattening of  diaphragm on lateral image with generalized hyperinflation  configuration consistent with element of COPD. No pulmonary consolidation or pleural effusion is seen. There is central peribronchial thickening. Changes of degenerative disc disease and degenerative spondylosis are present.   If acute pulmonary symptoms and no significant change in his status then anticipate he can proceed as planned.  Shonna Chock, PA-C

## 2012-02-11 MED ORDER — CEFAZOLIN SODIUM-DEXTROSE 2-3 GM-% IV SOLR
2.0000 g | INTRAVENOUS | Status: AC
  Administered 2012-02-12: 2 g via INTRAVENOUS
  Filled 2012-02-11 (×2): qty 50

## 2012-02-12 ENCOUNTER — Encounter (HOSPITAL_COMMUNITY): Payer: Self-pay | Admitting: Vascular Surgery

## 2012-02-12 ENCOUNTER — Encounter (HOSPITAL_COMMUNITY): Payer: Self-pay | Admitting: Certified Registered Nurse Anesthetist

## 2012-02-12 ENCOUNTER — Inpatient Hospital Stay (HOSPITAL_COMMUNITY): Payer: Medicare Other

## 2012-02-12 ENCOUNTER — Inpatient Hospital Stay (HOSPITAL_COMMUNITY): Payer: Medicare Other | Admitting: Vascular Surgery

## 2012-02-12 ENCOUNTER — Encounter (HOSPITAL_COMMUNITY): Payer: Self-pay | Admitting: *Deleted

## 2012-02-12 ENCOUNTER — Encounter (HOSPITAL_COMMUNITY)
Admission: RE | Disposition: A | Discharge status: Home Health | Payer: Self-pay | Source: Ambulatory Visit | Attending: Orthopedic Surgery

## 2012-02-12 ENCOUNTER — Inpatient Hospital Stay (HOSPITAL_COMMUNITY)
Admission: RE | Admit: 2012-02-12 | Discharge: 2012-02-14 | DRG: 470 | Disposition: A | Discharge status: Home Health | Payer: Medicare Other | Source: Ambulatory Visit | Attending: Orthopedic Surgery | Admitting: Orthopedic Surgery

## 2012-02-12 DIAGNOSIS — K449 Diaphragmatic hernia without obstruction or gangrene: Secondary | ICD-10-CM | POA: Diagnosis present

## 2012-02-12 DIAGNOSIS — Z96649 Presence of unspecified artificial hip joint: Secondary | ICD-10-CM | POA: Diagnosis not present

## 2012-02-12 DIAGNOSIS — M1710 Unilateral primary osteoarthritis, unspecified knee: Secondary | ICD-10-CM | POA: Diagnosis not present

## 2012-02-12 DIAGNOSIS — M069 Rheumatoid arthritis, unspecified: Secondary | ICD-10-CM | POA: Diagnosis present

## 2012-02-12 DIAGNOSIS — Z01811 Encounter for preprocedural respiratory examination: Secondary | ICD-10-CM

## 2012-02-12 DIAGNOSIS — E2749 Other adrenocortical insufficiency: Secondary | ICD-10-CM | POA: Diagnosis present

## 2012-02-12 DIAGNOSIS — Z79899 Other long term (current) drug therapy: Secondary | ICD-10-CM | POA: Diagnosis not present

## 2012-02-12 DIAGNOSIS — K219 Gastro-esophageal reflux disease without esophagitis: Secondary | ICD-10-CM | POA: Diagnosis present

## 2012-02-12 DIAGNOSIS — D62 Acute posthemorrhagic anemia: Secondary | ICD-10-CM | POA: Diagnosis not present

## 2012-02-12 DIAGNOSIS — Z01818 Encounter for other preprocedural examination: Secondary | ICD-10-CM | POA: Diagnosis not present

## 2012-02-12 DIAGNOSIS — M12569 Traumatic arthropathy, unspecified knee: Secondary | ICD-10-CM | POA: Diagnosis present

## 2012-02-12 DIAGNOSIS — IMO0002 Reserved for concepts with insufficient information to code with codable children: Secondary | ICD-10-CM | POA: Diagnosis not present

## 2012-02-12 DIAGNOSIS — Z96659 Presence of unspecified artificial knee joint: Secondary | ICD-10-CM | POA: Diagnosis not present

## 2012-02-12 DIAGNOSIS — M1712 Unilateral primary osteoarthritis, left knee: Secondary | ICD-10-CM

## 2012-02-12 DIAGNOSIS — I10 Essential (primary) hypertension: Secondary | ICD-10-CM | POA: Diagnosis present

## 2012-02-12 DIAGNOSIS — Z01812 Encounter for preprocedural laboratory examination: Secondary | ICD-10-CM

## 2012-02-12 DIAGNOSIS — M25569 Pain in unspecified knee: Secondary | ICD-10-CM | POA: Diagnosis not present

## 2012-02-12 DIAGNOSIS — G8918 Other acute postprocedural pain: Secondary | ICD-10-CM | POA: Diagnosis not present

## 2012-02-12 HISTORY — PX: TOTAL KNEE ARTHROPLASTY: SHX125

## 2012-02-12 SURGERY — ARTHROPLASTY, KNEE, TOTAL
Anesthesia: Regional | Site: Knee | Laterality: Right | Wound class: Clean

## 2012-02-12 MED ORDER — OXYCODONE HCL 5 MG PO TABS
5.0000 mg | ORAL_TABLET | ORAL | Status: DC | PRN
  Administered 2012-02-12 – 2012-02-14 (×3): 10 mg via ORAL
  Filled 2012-02-12 (×2): qty 2

## 2012-02-12 MED ORDER — OXYCODONE HCL 5 MG PO TABS: 5.0000 mg | ORAL_TABLET | Freq: Once | ORAL | Status: DC | PRN

## 2012-02-12 MED ORDER — BUPIVACAINE 0.25 % ON-Q PUMP SINGLE CATH 300ML
INJECTION | Status: DC | PRN
  Administered 2012-02-12: 300 mL

## 2012-02-12 MED ORDER — FLUDROCORTISONE ACETATE 0.1 MG PO TABS
0.1000 mg | ORAL_TABLET | Freq: Every day | ORAL | Status: DC
  Administered 2012-02-13 – 2012-02-14 (×2): 0.1 mg via ORAL
  Filled 2012-02-12 (×2): qty 1

## 2012-02-12 MED ORDER — IBANDRONATE SODIUM 150 MG PO TABS: 150.0000 mg | ORAL_TABLET | ORAL | Status: DC

## 2012-02-12 MED ORDER — CHLORHEXIDINE GLUCONATE 4 % EX LIQD: 60.0000 mL | Freq: Once | CUTANEOUS | Status: DC

## 2012-02-12 MED ORDER — HYDROMORPHONE HCL PF 1 MG/ML IJ SOLN: 0.5000 mg | INTRAMUSCULAR | Status: DC | PRN

## 2012-02-12 MED ORDER — ACETAMINOPHEN 650 MG RE SUPP: 650.0000 mg | Freq: Four times a day (QID) | RECTAL | Status: DC | PRN

## 2012-02-12 MED ORDER — METHOCARBAMOL 100 MG/ML IJ SOLN
500.0000 mg | Freq: Four times a day (QID) | INTRAVENOUS | Status: DC | PRN
  Filled 2012-02-12: qty 5

## 2012-02-12 MED ORDER — NAPROXEN 250 MG PO TABS
250.0000 mg | ORAL_TABLET | Freq: Two times a day (BID) | ORAL | Status: DC
  Administered 2012-02-13 – 2012-02-14 (×3): 250 mg via ORAL
  Filled 2012-02-12 (×6): qty 1

## 2012-02-12 MED ORDER — ALUM & MAG HYDROXIDE-SIMETH 200-200-20 MG/5ML PO SUSP: 30.0000 mL | ORAL | Status: DC | PRN

## 2012-02-12 MED ORDER — PHENYLEPHRINE HCL 10 MG/ML IJ SOLN
INTRAMUSCULAR | Status: DC | PRN
  Administered 2012-02-12: 80 ug via INTRAVENOUS

## 2012-02-12 MED ORDER — FLEET ENEMA 7-19 GM/118ML RE ENEM: 1.0000 | ENEMA | Freq: Once | RECTAL | Status: AC | PRN

## 2012-02-12 MED ORDER — DOCUSATE SODIUM 100 MG PO CAPS
100.0000 mg | ORAL_CAPSULE | Freq: Two times a day (BID) | ORAL | Status: DC
  Administered 2012-02-12 – 2012-02-14 (×4): 100 mg via ORAL
  Filled 2012-02-12 (×5): qty 1

## 2012-02-12 MED ORDER — BUPIVACAINE 0.25 % ON-Q PUMP SINGLE CATH 300ML
300.0000 mL | INJECTION | Status: DC
  Filled 2012-02-12: qty 300

## 2012-02-12 MED ORDER — BUPIVACAINE-EPINEPHRINE PF 0.25-1:200000 % IJ SOLN
INTRAMUSCULAR | Status: AC
  Filled 2012-02-12: qty 30

## 2012-02-12 MED ORDER — OXYCODONE HCL ER 10 MG PO T12A
10.0000 mg | EXTENDED_RELEASE_TABLET | Freq: Two times a day (BID) | ORAL | Status: DC
  Administered 2012-02-12 – 2012-02-14 (×4): 10 mg via ORAL
  Filled 2012-02-12 (×5): qty 1

## 2012-02-12 MED ORDER — ONDANSETRON HCL 4 MG/2ML IJ SOLN: 4.0000 mg | Freq: Four times a day (QID) | INTRAMUSCULAR | Status: DC | PRN

## 2012-02-12 MED ORDER — OXYCODONE HCL 5 MG/5ML PO SOLN: 5.0000 mg | Freq: Once | ORAL | Status: DC | PRN

## 2012-02-12 MED ORDER — CEFAZOLIN SODIUM 1-5 GM-% IV SOLN
1.0000 g | Freq: Four times a day (QID) | INTRAVENOUS | Status: AC
  Administered 2012-02-12 (×2): 1 g via INTRAVENOUS
  Filled 2012-02-12 (×2): qty 50

## 2012-02-12 MED ORDER — BISACODYL 5 MG PO TBEC: 5.0000 mg | DELAYED_RELEASE_TABLET | Freq: Every day | ORAL | Status: DC | PRN

## 2012-02-12 MED ORDER — ONDANSETRON HCL 4 MG/2ML IJ SOLN
4.0000 mg | Freq: Four times a day (QID) | INTRAMUSCULAR | Status: DC | PRN
  Administered 2012-02-12 (×2): 4 mg via INTRAVENOUS
  Filled 2012-02-12 (×2): qty 2

## 2012-02-12 MED ORDER — PHENOL 1.4 % MT LIQD: 1.0000 | OROMUCOSAL | Status: DC | PRN

## 2012-02-12 MED ORDER — METOCLOPRAMIDE HCL 10 MG PO TABS: 5.0000 mg | ORAL_TABLET | Freq: Three times a day (TID) | ORAL | Status: DC | PRN

## 2012-02-12 MED ORDER — HYDROXYCHLOROQUINE SULFATE 200 MG PO TABS
200.0000 mg | ORAL_TABLET | Freq: Every day | ORAL | Status: DC
  Administered 2012-02-13 – 2012-02-14 (×2): 200 mg via ORAL
  Filled 2012-02-12 (×2): qty 1

## 2012-02-12 MED ORDER — METOCLOPRAMIDE HCL 5 MG/ML IJ SOLN: 5.0000 mg | Freq: Three times a day (TID) | INTRAMUSCULAR | Status: DC | PRN

## 2012-02-12 MED ORDER — ACETAMINOPHEN 10 MG/ML IV SOLN
1000.0000 mg | Freq: Four times a day (QID) | INTRAVENOUS | Status: DC
  Administered 2012-02-12: 1000 mg via INTRAVENOUS

## 2012-02-12 MED ORDER — HYDROCORTISONE SOD SUCCINATE 100 MG IJ SOLR
INTRAMUSCULAR | Status: DC | PRN
  Administered 2012-02-12: 100 mg via INTRAVENOUS

## 2012-02-12 MED ORDER — ZOLPIDEM TARTRATE 5 MG PO TABS: 5.0000 mg | ORAL_TABLET | Freq: Every evening | ORAL | Status: DC | PRN

## 2012-02-12 MED ORDER — SIMVASTATIN 10 MG PO TABS
10.0000 mg | ORAL_TABLET | Freq: Every day | ORAL | Status: DC
  Administered 2012-02-13: 10 mg via ORAL
  Filled 2012-02-12 (×3): qty 1

## 2012-02-12 MED ORDER — IRBESARTAN 300 MG PO TABS
300.0000 mg | ORAL_TABLET | Freq: Every day | ORAL | Status: DC
  Administered 2012-02-13 – 2012-02-14 (×2): 300 mg via ORAL
  Filled 2012-02-12 (×3): qty 1

## 2012-02-12 MED ORDER — METHOCARBAMOL 100 MG/ML IJ SOLN
500.0000 mg | INTRAVENOUS | Status: AC
  Administered 2012-02-12: 500 mg via INTRAVENOUS
  Filled 2012-02-12: qty 5

## 2012-02-12 MED ORDER — METHOCARBAMOL 500 MG PO TABS
500.0000 mg | ORAL_TABLET | Freq: Four times a day (QID) | ORAL | Status: DC | PRN
  Administered 2012-02-13 – 2012-02-14 (×2): 500 mg via ORAL
  Filled 2012-02-12 (×2): qty 1

## 2012-02-12 MED ORDER — HYDROMORPHONE HCL PF 1 MG/ML IJ SOLN: 0.2500 mg | INTRAMUSCULAR | Status: DC | PRN

## 2012-02-12 MED ORDER — MIDAZOLAM HCL 5 MG/5ML IJ SOLN
INTRAMUSCULAR | Status: DC | PRN
  Administered 2012-02-12 (×2): 1 mg via INTRAVENOUS

## 2012-02-12 MED ORDER — BUPIVACAINE-EPINEPHRINE PF 0.5-1:200000 % IJ SOLN
INTRAMUSCULAR | Status: DC | PRN
  Administered 2012-02-12: 125 mg

## 2012-02-12 MED ORDER — FENTANYL CITRATE 0.05 MG/ML IJ SOLN
INTRAMUSCULAR | Status: DC | PRN
  Administered 2012-02-12: 25 ug via INTRAVENOUS
  Administered 2012-02-12 (×2): 50 ug via INTRAVENOUS

## 2012-02-12 MED ORDER — BUPIVACAINE-EPINEPHRINE 0.25% -1:200000 IJ SOLN
INTRAMUSCULAR | Status: DC | PRN
  Administered 2012-02-12: 20 mL

## 2012-02-12 MED ORDER — DIGOXIN 125 MCG PO TABS
0.1250 mg | ORAL_TABLET | Freq: Every day | ORAL | Status: DC
  Administered 2012-02-12 – 2012-02-14 (×3): 0.125 mg via ORAL
  Filled 2012-02-12 (×3): qty 1

## 2012-02-12 MED ORDER — ACETAMINOPHEN 10 MG/ML IV SOLN
INTRAVENOUS | Status: AC
  Filled 2012-02-12: qty 100

## 2012-02-12 MED ORDER — MENTHOL 3 MG MT LOZG: 1.0000 | LOZENGE | OROMUCOSAL | Status: DC | PRN

## 2012-02-12 MED ORDER — ONDANSETRON HCL 4 MG PO TABS: 4.0000 mg | ORAL_TABLET | Freq: Four times a day (QID) | ORAL | Status: DC | PRN

## 2012-02-12 MED ORDER — ENOXAPARIN SODIUM 30 MG/0.3ML ~~LOC~~ SOLN
30.0000 mg | Freq: Two times a day (BID) | SUBCUTANEOUS | Status: DC
  Administered 2012-02-13 – 2012-02-14 (×3): 30 mg via SUBCUTANEOUS
  Filled 2012-02-12 (×5): qty 0.3

## 2012-02-12 MED ORDER — PROMETHAZINE HCL 25 MG/ML IJ SOLN: 6.2500 mg | INTRAMUSCULAR | Status: DC | PRN

## 2012-02-12 MED ORDER — DIPHENHYDRAMINE HCL 12.5 MG/5ML PO ELIX: 12.5000 mg | ORAL_SOLUTION | ORAL | Status: DC | PRN

## 2012-02-12 MED ORDER — ACETAMINOPHEN 10 MG/ML IV SOLN
1000.0000 mg | Freq: Four times a day (QID) | INTRAVENOUS | Status: AC
  Administered 2012-02-12 – 2012-02-13 (×4): 1000 mg via INTRAVENOUS
  Filled 2012-02-12 (×4): qty 100

## 2012-02-12 MED ORDER — EPHEDRINE SULFATE 50 MG/ML IJ SOLN
INTRAMUSCULAR | Status: DC | PRN
  Administered 2012-02-12: 10 mg via INTRAVENOUS

## 2012-02-12 MED ORDER — OXYCODONE HCL 5 MG PO TABS
ORAL_TABLET | ORAL | Status: AC
  Filled 2012-02-12: qty 2

## 2012-02-12 MED ORDER — HYDROCORTISONE 20 MG PO TABS
30.0000 mg | ORAL_TABLET | Freq: Every day | ORAL | Status: DC
  Administered 2012-02-13 – 2012-02-14 (×2): 30 mg via ORAL
  Filled 2012-02-12 (×2): qty 1

## 2012-02-12 MED ORDER — SODIUM CHLORIDE 0.9 % IV SOLN: INTRAVENOUS | Status: DC

## 2012-02-12 MED ORDER — ACETAMINOPHEN 325 MG PO TABS: 650.0000 mg | ORAL_TABLET | Freq: Four times a day (QID) | ORAL | Status: DC | PRN

## 2012-02-12 MED ORDER — PROPOFOL 10 MG/ML IV BOLUS
INTRAVENOUS | Status: DC | PRN
  Administered 2012-02-12: 160 mg via INTRAVENOUS

## 2012-02-12 MED ORDER — HYDROMORPHONE HCL PF 1 MG/ML IJ SOLN
0.2500 mg | INTRAMUSCULAR | Status: DC | PRN
  Administered 2012-02-12: 0.5 mg via INTRAVENOUS

## 2012-02-12 MED ORDER — ONDANSETRON HCL 4 MG/2ML IJ SOLN
INTRAMUSCULAR | Status: DC | PRN
  Administered 2012-02-12: 4 mg via INTRAVENOUS

## 2012-02-12 MED ORDER — LACTATED RINGERS IV SOLN
INTRAVENOUS | Status: DC | PRN
  Administered 2012-02-12 (×2): via INTRAVENOUS

## 2012-02-12 MED ORDER — ALPRAZOLAM 0.25 MG PO TABS
0.2500 mg | ORAL_TABLET | Freq: Every day | ORAL | Status: DC
  Administered 2012-02-12 – 2012-02-13 (×2): 0.25 mg via ORAL
  Filled 2012-02-12 (×2): qty 1

## 2012-02-12 MED ORDER — SODIUM CHLORIDE 0.9 % IR SOLN
Status: DC | PRN
  Administered 2012-02-12: 3000 mL

## 2012-02-12 MED ORDER — SENNOSIDES-DOCUSATE SODIUM 8.6-50 MG PO TABS: 1.0000 | ORAL_TABLET | Freq: Every evening | ORAL | Status: DC | PRN

## 2012-02-12 MED ORDER — PANTOPRAZOLE SODIUM 40 MG PO TBEC
40.0000 mg | DELAYED_RELEASE_TABLET | Freq: Every day | ORAL | Status: DC
  Administered 2012-02-12 – 2012-02-14 (×3): 40 mg via ORAL
  Filled 2012-02-12 (×3): qty 1

## 2012-02-12 MED ORDER — HYDROMORPHONE HCL PF 1 MG/ML IJ SOLN
INTRAMUSCULAR | Status: AC
  Filled 2012-02-12: qty 1

## 2012-02-12 MED ORDER — NAPROXEN SODIUM 220 MG PO TABS: 220.0000 mg | ORAL_TABLET | Freq: Two times a day (BID) | ORAL | Status: DC

## 2012-02-12 MED ORDER — FLUTICASONE PROPIONATE 50 MCG/ACT NA SUSP
1.0000 | Freq: Every day | NASAL | Status: DC
  Administered 2012-02-13 – 2012-02-14 (×2): 1 via NASAL
  Filled 2012-02-12 (×2): qty 16

## 2012-02-12 MED ORDER — BUPIVACAINE ON-Q PAIN PUMP (FOR ORDER SET NO CHG)
INJECTION | Status: DC
  Filled 2012-02-12: qty 1

## 2012-02-12 MED ORDER — LIDOCAINE HCL (CARDIAC) 20 MG/ML IV SOLN
INTRAVENOUS | Status: DC | PRN
  Administered 2012-02-12: 80 mg via INTRAVENOUS

## 2012-02-12 SURGICAL SUPPLY — 62 items
BANDAGE ESMARK 6X9 LF (GAUZE/BANDAGES/DRESSINGS) ×1 IMPLANT
BLADE SAGITTAL 13X1.27X60 (BLADE) ×2 IMPLANT
BLADE SAW SGTL 83.5X18.5 (BLADE) ×2 IMPLANT
BLADE SURG 10 STRL SS (BLADE) ×1 IMPLANT
BNDG CMPR 9X6 STRL LF SNTH (GAUZE/BANDAGES/DRESSINGS) ×1
BNDG ESMARK 6X9 LF (GAUZE/BANDAGES/DRESSINGS) ×2
BOWL SMART MIX CTS (DISPOSABLE) ×2 IMPLANT
CATH KIT ON Q 5IN SLV (PAIN MANAGEMENT) ×2 IMPLANT
CEMENT BONE SIMPLEX SPEEDSET (Cement) ×4 IMPLANT
CLOTH BEACON ORANGE TIMEOUT ST (SAFETY) ×2 IMPLANT
COVER BACK TABLE 24X17X13 BIG (DRAPES) IMPLANT
COVER SURGICAL LIGHT HANDLE (MISCELLANEOUS) ×2 IMPLANT
CUFF TOURNIQUET SINGLE 24IN (TOURNIQUET CUFF) ×1 IMPLANT
CUFF TOURNIQUET SINGLE 34IN LL (TOURNIQUET CUFF) ×2 IMPLANT
DRAPE C-ARM 42X72 X-RAY (DRAPES) ×1 IMPLANT
DRAPE EXTREMITY T 121X128X90 (DRAPE) ×2 IMPLANT
DRAPE INCISE IOBAN 66X45 STRL (DRAPES) ×4 IMPLANT
DRAPE PROXIMA HALF (DRAPES) ×2 IMPLANT
DRAPE U-SHAPE 47X51 STRL (DRAPES) ×2 IMPLANT
DRSG ADAPTIC 3X8 NADH LF (GAUZE/BANDAGES/DRESSINGS) ×2 IMPLANT
DRSG PAD ABDOMINAL 8X10 ST (GAUZE/BANDAGES/DRESSINGS) ×3 IMPLANT
DURAPREP 26ML APPLICATOR (WOUND CARE) ×4 IMPLANT
ELECT REM PT RETURN 9FT ADLT (ELECTROSURGICAL) ×2
ELECTRODE REM PT RTRN 9FT ADLT (ELECTROSURGICAL) ×1 IMPLANT
EVACUATOR 1/8 PVC DRAIN (DRAIN) ×2 IMPLANT
GLOVE BIO SURGEON STRL SZ 6 (GLOVE) ×2 IMPLANT
GLOVE BIOGEL M 7.0 STRL (GLOVE) IMPLANT
GLOVE BIOGEL PI IND STRL 7.5 (GLOVE) IMPLANT
GLOVE BIOGEL PI IND STRL 8.5 (GLOVE) ×1 IMPLANT
GLOVE BIOGEL PI INDICATOR 7.5 (GLOVE)
GLOVE BIOGEL PI INDICATOR 8.5 (GLOVE) ×1
GLOVE BIOGEL PI ORTHO PRO SZ8 (GLOVE) ×1
GLOVE PI ORTHO PRO STRL SZ8 (GLOVE) ×1 IMPLANT
GLOVE SURG ORTHO 8.0 STRL STRW (GLOVE) ×4 IMPLANT
GOWN PREVENTION PLUS XLARGE (GOWN DISPOSABLE) ×4 IMPLANT
GOWN STRL NON-REIN LRG LVL3 (GOWN DISPOSABLE) ×4 IMPLANT
HANDPIECE INTERPULSE COAX TIP (DISPOSABLE) ×2
HOOD PEEL AWAY FACE SHEILD DIS (HOOD) ×8 IMPLANT
KIT BASIN OR (CUSTOM PROCEDURE TRAY) ×2 IMPLANT
KIT ROOM TURNOVER OR (KITS) ×2 IMPLANT
MANIFOLD NEPTUNE II (INSTRUMENTS) ×2 IMPLANT
NEEDLE 22X1 1/2 (OR ONLY) (NEEDLE) IMPLANT
NS IRRIG 1000ML POUR BTL (IV SOLUTION) ×2 IMPLANT
PACK TOTAL JOINT (CUSTOM PROCEDURE TRAY) ×2 IMPLANT
PAD ARMBOARD 7.5X6 YLW CONV (MISCELLANEOUS) ×4 IMPLANT
PADDING CAST COTTON 6X4 STRL (CAST SUPPLIES) ×2 IMPLANT
POSITIONER HEAD PRONE TRACH (MISCELLANEOUS) ×2 IMPLANT
SET HNDPC FAN SPRY TIP SCT (DISPOSABLE) ×1 IMPLANT
SPONGE GAUZE 4X4 12PLY (GAUZE/BANDAGES/DRESSINGS) ×2 IMPLANT
STAPLER VISISTAT 35W (STAPLE) ×2 IMPLANT
SUCTION FRAZIER TIP 10 FR DISP (SUCTIONS) ×2 IMPLANT
SUT BONE WAX W31G (SUTURE) ×2 IMPLANT
SUT VIC AB 0 CTB1 27 (SUTURE) ×4 IMPLANT
SUT VIC AB 1 CT1 27 (SUTURE) ×6
SUT VIC AB 1 CT1 27XBRD ANBCTR (SUTURE) ×2 IMPLANT
SUT VIC AB 2-0 CT1 27 (SUTURE) ×8
SUT VIC AB 2-0 CT1 TAPERPNT 27 (SUTURE) ×2 IMPLANT
SYR CONTROL 10ML LL (SYRINGE) IMPLANT
TOWEL OR 17X24 6PK STRL BLUE (TOWEL DISPOSABLE) ×2 IMPLANT
TOWEL OR 17X26 10 PK STRL BLUE (TOWEL DISPOSABLE) ×2 IMPLANT
TRAY FOLEY CATH 14FR (SET/KITS/TRAYS/PACK) ×2 IMPLANT
WATER STERILE IRR 1000ML POUR (IV SOLUTION) ×6 IMPLANT

## 2012-02-12 NOTE — Plan of Care (Signed)
Problem: Consults Goal: Diagnosis- Total Joint Replacement Primary Total Knee Left     

## 2012-02-12 NOTE — Preoperative (Signed)
Beta Blockers   Reason not to administer Beta Blockers:Not Applicable 

## 2012-02-12 NOTE — Evaluation (Signed)
Physical Therapy Evaluation Patient Details Name: John Lyons MRN: [id number] DOB: Jun 02, 1945 Today's Date: 02/12/2012 Time: 5621-3086 PT Time Calculation (min): 29 min  PT Assessment / Plan / Recommendation Clinical Impression  Pt is a pleasent 66 y.o. male s/p R TKA.  Pt post op day zero.  Pt was able to sit at edge of bed with minimal dizziness reported which resolved withing a few minutes.  Attempted to stand with patient for transfer to chair, however, pt unable to perform transfer at this time secondary to ROM restrictions in both LEs.  During activity patient began to feel nauseated and diaphoretic.  Patient return to seated position at EOB for several minutes and monitored for changes.  Symptoms resolved and patient was returned to bed supine with HOB elevated.  Nsg notified and request for new CPM machine placed with nsg.  Feel pt will benefit from continued skilled PT to address deficits in functional mobility.    PT Assessment  Patient needs continued PT services    Follow Up Recommendations  Home health PT    Does the patient have the potential to tolerate intense rehabilitation      Barriers to Discharge        Equipment Recommendations  None recommended by PT    Recommendations for Other Services OT consult   Frequency 7X/week    Precautions / Restrictions Precautions Precautions: Knee Restrictions Weight Bearing Restrictions: Yes RLE Weight Bearing: Weight bearing as tolerated   Pertinent Vitals/Pain 4/10      Mobility  Bed Mobility Bed Mobility: Supine to Sit;Sitting - Scoot to Edge of Bed Supine to Sit: 4: Min assist;With rails Sitting - Scoot to [place] of Bed: 4: Min assist;With rail Details for Bed Mobility Assistance: Assist for both LE Transfers Transfers: Sit to Stand;Stand to Sit Sit to Stand: 2: Max assist;Without upper extremity assist;Other (comment) (Could not perform due to restricted LLE and current RLE) Stand to Sit: 2: Max assist;Without  upper extremity assist;Other (comment) Details for Transfer Assistance: Pt began to feel dizzy while attempting to stand Ambulation/Gait Ambulation/Gait Assistance: Not tested (comment)    Shoulder Instructions     Exercises Total Joint Exercises Ankle Circles/Pumps: AROM;20 reps;Both   PT Diagnosis: Difficulty walking;Acute pain;Generalized weakness;Abnormality of gait  PT Problem List: Decreased strength;Decreased range of motion;Decreased activity tolerance;Decreased balance;Decreased mobility;Decreased knowledge of use of DME;Pain PT Treatment Interventions: DME instruction;Gait training;Stair training;Functional mobility training;Therapeutic exercise;Therapeutic activities;Patient/family education   PT Goals Acute Rehab PT Goals PT Goal Formulation: With patient Time For Goal Achievement: 02/17/12 Potential to Achieve Goals: Good Pt will go Supine/Side to Sit: with modified independence PT Goal: Supine/Side to Sit - Progress: Goal set today Pt will go Sit to Supine/Side: with modified independence PT Goal: Sit to Supine/Side - Progress: Goal set today Pt will Stand: with modified independence PT Goal: Stand - Progress: Goal set today Pt will Ambulate: >150 feet;with modified independence;with rolling walker PT Goal: Ambulate - Progress: Goal set today Pt will Go Up / Down Stairs: 1-2 stairs;with min assist PT Goal: Up/Down Stairs - Progress: Goal set today Pt will Perform Home Exercise Program: with supervision, verbal cues required/provided PT Goal: Perform Home Exercise Program - Progress: Goal set today  Visit Information  Last PT Received On: 02/12/12    Subjective Data  Subjective: pt is agreeable to PT Patient Stated Goal: to go home   Prior Functioning  Home Living Lives With: Spouse Available Help at Discharge: Family;Friend(s);Neighbor Type of Home: House Home Access: Stairs to  enter Entrance Stairs-Number of Steps: 1 Entrance Stairs-Rails: None Home  Layout: One level Bathroom Shower/Tub: Health visitor: Handicapped height Bathroom Accessibility: No Home Adaptive Equipment: Walker - rolling;Bedside commode/3-in-1;Straight cane Prior Function Level of Independence: Independent Able to Take Stairs?: Yes Driving: Yes 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 Communication Communication: No difficulties Dominant Hand: Right    Cognition  Overall Cognitive Status: Appears within functional limits for tasks assessed/performed Arousal/Alertness: Awake/alert Orientation Level: Appears intact for tasks assessed;Oriented X4 / Intact Behavior During Session: [facility] for tasks performed    Extremity/Trunk Assessment Right Upper Extremity Assessment RUE ROM/Strength/Tone: Deficits (arthritis) Left Upper Extremity Assessment LUE ROM/Strength/Tone: Deficits Right Lower Extremity Assessment RLE ROM/Strength/Tone: Deficits;Unable to fully assess;Due to pain;Due to precautions Left Lower Extremity Assessment LLE ROM/Strength/Tone: Deficits (Pt has no flexion ROM in LLE from complications of prior TKA) Trunk Assessment Trunk Assessment: Normal   Balance    End of Session PT - End of Session Equipment Utilized During Treatment: Gait belt Activity Tolerance: Patient limited by fatigue;Patient limited by pain;Other (comment) (Pt became dizzy and nauseated upon standing) Patient left: in bed;with call bell/phone within reach;with family/visitor present Nurse Communication: Mobility status (Need CPM fixed, need Pulse Ox fixed, need O2) CPM Right Knee CPM Right Knee: Other (Comment) Additional Comments: CPM not working properly, NSG aware  GP     Fabio Asa 02/12/2012, 3:47 PM  Charlotte Crumb, PT DPT  [phone]

## 2012-02-12 NOTE — Progress Notes (Signed)
PHARMACIST - PHYSICIAN COMMUNICATION  CONCERNING: P&T Medication Policy Regarding Oral Bisphosphonates  RECOMMENDATION: Your order for alendronate (Fosamax), ibandronate (Boniva), or risedronate (Actonel) has been discontinued at this time.  If the patient's post-hospital medical condition warrants safe use of this class of drugs, please resume the pre-hospital regimen upon discharge.  DESCRIPTION:  Alendronate (Fosamax), ibandronate (Boniva), and risedronate (Actonel) can cause severe esophageal erosions in patients who are unable to remain upright at least 30 minutes after taking this medication.   Since brief interruptions in therapy are thought to have minimal impact on bone mineral density, the Pharmacy & Therapeutics Committee has established that bisphosphonate orders should be routinely discontinued during hospitalization.   To override this safety policy and permit administration of Boniva, Fosamax, or Actonel in the hospital, prescribers must write "DO NOT HOLD" in the comments section when placing the order for this class of medications.    Shaguana Love, PharmD Clinical Pharmacist [facility]    

## 2012-02-12 NOTE — Progress Notes (Signed)
Orthopedic Tech Progress Note Patient Details:  John Lyons 02/18/1946 [id number]  CPM Right Knee CPM Right Knee: On Right Knee Flexion (Degrees): 90  Right Knee Extension (Degrees): 0  Additional Comments: trapeze bar   [name], [name] 02/12/2012, 10:56 AM

## 2012-02-12 NOTE — Anesthesia Preprocedure Evaluation (Addendum)
Anesthesia Evaluation  Patient identified by MRN, date of birth, ID band Patient awake    Reviewed: Allergy & Precautions, H&P , NPO status , Patient's Chart, lab work & pertinent test results  History of Anesthesia Complications (+) PONV  Airway Mallampati: III TM Distance: >3 FB Neck ROM: Full  Mouth opening: Limited Mouth Opening  Dental  (+) Teeth Intact and Dental Advisory Given   Pulmonary neg pulmonary ROS,    Pulmonary exam normal       Cardiovascular hypertension, Pt. on medications + dysrhythmias     Neuro/Psych negative neurological ROS  negative psych ROS   GI/Hepatic Neg liver ROS, hiatal hernia, GERD-  Medicated and Controlled,Patient received Oral Contrast Agents,  Endo/Other  Addison's Disease  Renal/GU      Musculoskeletal  (+) Arthritis -, Rheumatoid disorders,    Abdominal   Peds  Hematology   Anesthesia Other Findings Addisons disease  Reproductive/Obstetrics                          Anesthesia Physical Anesthesia Plan  ASA: III  Anesthesia Plan: General   Post-op Pain Management:    Induction: Intravenous  Airway Management Planned: LMA  Additional Equipment:   Intra-op Plan:   Post-operative Plan: Extubation in OR  Informed Consent: I have reviewed the patients History and Physical, chart, labs and discussed the procedure including the risks, benefits and alternatives for the proposed anesthesia with the patient or authorized representative who has indicated his/her understanding and acceptance.   Dental advisory given  Plan Discussed with: CRNA, Anesthesiologist and Surgeon  Anesthesia Plan Comments:         Anesthesia Quick Evaluation

## 2012-02-12 NOTE — Transfer of Care (Signed)
Immediate Anesthesia Transfer of Care Note  Patient: John Lyons  Procedure(s) Performed: Procedure(s) (LRB) with comments: TOTAL KNEE ARTHROPLASTY (Right)  Patient Location: PACU  Anesthesia Type: General  Level of Consciousness: awake, alert , oriented and patient cooperative  Airway & Oxygen Therapy: Patient Spontanous Breathing and Patient connected to nasal cannula oxygen  Post-op Assessment: Report given to PACU RN, Post -op Vital signs reviewed and stable and Patient moving all extremities X 4  Post vital signs: Reviewed and stable  Complications: No apparent anesthesia complications

## 2012-02-12 NOTE — Anesthesia Procedure Notes (Addendum)
Anesthesia Regional Block:  Femoral nerve block  Pre-Anesthetic Checklist: ,, timeout performed, Correct Patient, Correct Site, Correct Laterality, Correct Procedure,, site marked, risks and benefits discussed, Surgical consent,  Pre-op evaluation,  At surgeon's request and post-op pain management   Prep: chloraprep       Needles:  Injection technique: Single-shot  Needle Type: Echogenic Stimulator Needle     Needle Length: 5cm 5 cm Needle Gauge: 22 and 22 G    Additional Needles:  Procedures: Doppler guided, ultrasound guided and nerve stimulator Femoral nerve block  Nerve Stimulator or Paresthesia:  Response: quadraceps contraction, 0.45 mA,   Additional Responses:   Narrative:  Start time: 02/12/2012 7:00 AM End time: 02/12/2012 7:10 AM Injection made incrementally with aspirations every 5 mL.  Performed by: Personally  Anesthesiologist: Halford Decamp, MD  Additional Notes: Functioning IV was confirmed and monitors were applied.  A 50mm 22ga Arrow echogenic stimulator needle was used. Sterile prep and drape,hand hygiene and sterile gloves were used. Ultrasound guidance: relevant anatomy identified, needle position confirmed, local anesthetic spread visualized around nerve(s)., vascular puncture avoided.  Image printed for medical record. Negative aspiration and negative test dose prior to incremental administration of local anesthetic. The patient tolerated the procedure well.    Femoral nerve block Procedure Name: LMA Insertion Date/Time: 02/12/2012 7:39 AM Performed by: Rogelia Boga Pre-anesthesia Checklist: Patient identified, Emergency Drugs available, Suction available, Patient being monitored and Timeout performed Patient Re-evaluated:Patient Re-evaluated prior to inductionOxygen Delivery Method: Circle system utilized Preoxygenation: Pre-oxygenation with 100% oxygen Intubation Type: IV induction LMA: LMA inserted LMA Size: 4.0 Tube type:  Oral Number of attempts: 2 Placement Confirmation: ETT inserted through vocal cords under direct vision,  positive ETCO2 and breath sounds checked- equal and bilateral Tube secured with: Tape Dental Injury: Teeth and Oropharynx as per pre-operative assessment

## 2012-02-12 NOTE — Progress Notes (Signed)
UR COMPLETED  

## 2012-02-12 NOTE — Progress Notes (Signed)
Orthopedic Tech Progress Note Patient Details:  John Lyons 07-Oct-1945 [id number]  Patient ID: [name], male   DOB: Jan 01, 1946, 66 y.o.   MRN: [id number] Adjusted cpm for pt;pt on cpm @70  degrees;rn notified@ 1725  Nikki Dom 02/12/2012, 6:04 PM

## 2012-02-12 NOTE — H&P (Signed)
John Lyons MRN:  [id number] DOB/SEX:  02/09/1946/male  CHIEF COMPLAINT:  Painful right Knee  HISTORY: Patient is a 66 y.o. male presented with a history of pain in the right knee. Onset of symptoms was gradual starting several years ago with gradually worsening course since that time. The patient noted no past surgery on the right knee. Prior procedures on the knee include meniscectomy. Patient has been treated conservatively with over-the-counter NSAIDs and activity modification. Patient currently rates pain in the knee at 8 out of 10 with activity. There is pain at night.  PAST MEDICAL HISTORY: There are no active problems to display for this patient.  Past Medical History  Diagnosis Date  . Addison's disease   . PONV (postoperative nausea and vomiting)   . Rheumatoid arthritis   . Hypertension   . Dysrhythmia     hx rapid heart rate in 70s, on digoxin  . H/O hiatal hernia   . GERD (gastroesophageal reflux disease)    Past Surgical History  Procedure Date  . Total knee arthroplasty 1984    Left   . Total hip arthroplasty 1979    bilat  . Eye surgery     cataracts  . Cardiovascular stress test 01/04/12    Dr. Allyson Sabal - [facility]     MEDICATIONS:   Prescriptions prior to admission  Medication Sig Dispense Refill  . acetaminophen (TYLENOL) 500 MG tablet Take 500 mg by mouth every 6 (six) hours as needed. For pain      . ALPRAZolam (XANAX) 0.25 MG tablet Take 0.25 mg by mouth at bedtime.      . Calcium Carbonate-Vitamin D (CALCIUM-VITAMIN D) 500-200 MG-UNIT per tablet Take 2 tablets by mouth daily.      . Cholecalciferol (VITAMIN D3) 1000 UNITS CAPS Take 2,000 Units by mouth daily.      . digoxin (LANOXIN) 0.125 MG tablet Take 0.125 mg by mouth daily.      . Flaxseed, Linseed, (EQL FLAX SEED OIL) 1000 MG CAPS Take 1,000 mg by mouth daily.      . fludrocortisone (FLORINEF) 0.1 MG tablet Take 0.1 mg by mouth daily.      . folic acid (FOLVITE) 400 MCG tablet Take  400 mcg by mouth daily.      . hydrocortisone (CORTEF) 20 MG tablet Take 30 mg by mouth daily.      . hydroxychloroquine (PLAQUENIL) 200 MG tablet Take 200 mg by mouth daily.      Marland Kitchen ibandronate (BONIVA) 150 MG tablet Take 150 mg by mouth every 30 (thirty) days. 1st weekend of the month;  Take in the morning with a full glass of water, on an empty stomach, and do not take anything else by mouth or lie down for the next 30 min.      . metroNIDAZOLE (METROGEL) 1 % gel Apply topically daily as needed.      . Multiple Vitamin (MULTIVITAMIN WITH MINERALS) TABS Take 1 tablet by mouth daily.      . mupirocin cream (BACTROBAN) 2 % Apply topically daily as needed.      . naproxen sodium (ANAPROX) 220 MG tablet Take 220 mg by mouth 2 (two) times daily with a meal.      . olopatadine (PATANOL) 0.1 % ophthalmic solution Place 1 drop into both eyes 2 (two) times daily as needed. For allergies      . Omega-3 Fatty Acids ([employer] FISH OIL) 1200 MG CPDR Take 2,400 mg by mouth daily.      Marland Kitchen  pantoprazole (PROTONIX) 40 MG tablet Take 40 mg by mouth daily.      . pravastatin (PRAVACHOL) 20 MG tablet Take 20 mg by mouth daily.      Marland Kitchen telmisartan (MICARDIS) 80 MG tablet Take 40 mg by mouth daily.      Marland Kitchen triamcinolone (NASACORT) 55 MCG/ACT nasal inhaler Place 2 sprays into the nose daily as needed. For allergies      . fluocinonide cream (LIDEX) 0.05 % Apply topically daily as needed.        ALLERGIES:   Allergies  Allergen Reactions  . Adhesive (Tape)     Rash    . Phenobarbital Rash    REVIEW OF SYSTEMS:  Pertinent items are noted in HPI.   FAMILY HISTORY:  History reviewed. No pertinent family history.  SOCIAL HISTORY:   History  Substance Use Topics  . Smoking status: Never Smoker   . Smokeless tobacco: Not on file  . Alcohol Use: No     EXAMINATION:  Vital signs in last 24 hours: Temp:  [98.1 F (36.7 C)] 98.1 F (36.7 C) (10/21 0602) Pulse Rate:  [70] 70  (10/21 0602) Resp:  [20] 20  (10/21  0602) BP: (120)/(74) 120/74 mmHg (10/21 0602) SpO2:  [96 %] 96 % (10/21 0602)  General appearance: alert, cooperative and no distress Lungs: clear to auscultation bilaterally Heart: regular rate and rhythm, S1, S2 normal, no murmur, click, rub or gallop Abdomen: soft, non-tender; bowel sounds normal; no masses,  no organomegaly Extremities: extremities normal, atraumatic, no cyanosis or edema and Homans sign is negative, no sign of DVT Pulses: 2+ and symmetric Skin: Skin color, texture, turgor normal. No rashes or lesions Neurologic: Alert and oriented X 3, normal strength and tone. Normal symmetric reflexes. Normal coordination and gait  Musculoskeletal:  ROM 0-50, Ligaments intact,  Imaging Review Plain radiographs demonstrate severe degenerative joint disease of the right knee. The overall alignment is significant varus. The bone quality appears to be fair for age and reported activity level.  Assessment/Plan: End stage arthritis, right knee   The patient history, physical examination and imaging studies are consistent with advanced degenerative joint disease of the right knee. The patient has failed conservative treatment.  The clearance notes were reviewed.  After discussion with the patient it was felt that Total Knee Replacement was indicated. The procedure,  risks, and benefits of total knee arthroplasty were presented and reviewed. The risks including but not limited to aseptic loosening, infection, blood clots, vascular injury, stiffness, patella tracking problems complications among others were discussed. The patient acknowledged the explanation, agreed to proceed with the plan.  John Lyons 02/12/2012, 7:16 AM

## 2012-02-12 NOTE — Anesthesia Postprocedure Evaluation (Signed)
Anesthesia Post Note  Patient: John Lyons  Procedure(s) Performed: Procedure(s) (LRB): TOTAL KNEE ARTHROPLASTY (Right)  Anesthesia type: General  Patient location: PACU  Post pain: Pain level controlled and Adequate analgesia  Post assessment: Post-op Vital signs reviewed, Patient's Cardiovascular Status Stable, Respiratory Function Stable, Patent Airway and Pain level controlled  Last Vitals:  Filed Vitals:   02/12/12 1023  BP:   Pulse: 80  Temp:   Resp: 14    Post vital signs: Reviewed and stable  Level of consciousness: awake, alert  and oriented  Complications: No apparent anesthesia complications

## 2012-02-13 ENCOUNTER — Encounter (HOSPITAL_COMMUNITY): Payer: Self-pay | Admitting: General Practice

## 2012-02-13 LAB — BASIC METABOLIC PANEL
Calcium: 7.8 mg/dL — ABNORMAL LOW (ref 8.4–10.5)
Creatinine, Ser: 0.55 mg/dL (ref 0.50–1.35)
GFR calc non Af Amer: >90 mL/min (ref >90)
Glucose, Bld: 100 mg/dL — ABNORMAL HIGH (ref 70–99)
Sodium: 138 mEq/L (ref 135–145)

## 2012-02-13 LAB — CBC
MCH: 30.2 pg (ref 26.0–34.0)
MCHC: 35.7 g/dL (ref 30.0–36.0)
MCV: 84.6 fL (ref 78.0–100.0)
Platelets: ADEQUATE 10*3/uL (ref 150–400)

## 2012-02-13 MED ORDER — OXYCODONE HCL 5 MG PO TABS: 5.0000 mg | ORAL_TABLET | ORAL | Status: DC | PRN

## 2012-02-13 MED ORDER — ENOXAPARIN SODIUM 40 MG/0.4ML ~~LOC~~ SOLN: 40.0000 mg | Freq: Every day | SUBCUTANEOUS | Status: DC

## 2012-02-13 MED ORDER — METHOCARBAMOL 500 MG PO TABS: 500.0000 mg | ORAL_TABLET | Freq: Four times a day (QID) | ORAL | Status: DC | PRN

## 2012-02-13 MED ORDER — OXYCODONE HCL ER 10 MG PO T12A: 10.0000 mg | EXTENDED_RELEASE_TABLET | Freq: Two times a day (BID) | ORAL | Status: DC

## 2012-02-13 NOTE — Progress Notes (Signed)
SPORTS MEDICINE AND JOINT REPLACEMENT  Georgena Spurling, MD   Altamese Cabal, PA-C [address] [place], [place], [place]  [address]                             [phone]   PROGRESS NOTE  Subjective:  negative for Chest Pain  negative for Shortness of Breath  positive for Nausea/Vomiting   negative for Calf Pain  negative for Bowel Movement   Tolerating Diet: yes         Patient reports pain as 7 on 0-10 scale.    Objective: Vital signs in last 24 hours:   Patient Vitals for the past 24 hrs:  BP Temp Temp src Pulse Resp SpO2  02/13/12 0549 110/52 mmHg 99.4 F (37.4 C) - 86  18  94 %  02/13/12 0243 136/68 mmHg 100.1 F (37.8 C) - 89  18  98 %  02/13/12 0000 - - - - 18  93 %  02/12/12 2344 132/55 mmHg 99.7 F (37.6 C) - 82  18  99 %  02/12/12 2030 - - - - 18  95 %  02/12/12 1600 - - - - 16  95 %  02/12/12 1400 125/57 mmHg 97.7 F (36.5 C) - 77  17  100 %  02/12/12 1200 139/65 mmHg 97.5 F (36.4 C) Oral 81  16  96 %  02/12/12 1053 - 98 F (36.7 C) - - - -  02/12/12 1052 - - - 77  12  100 %  02/12/12 1051 - - - 77  15  100 %  02/12/12 1050 - - - 78  12  100 %  02/12/12 1049 - - - 77  12  98 %  02/12/12 1048 - - - 78  17  98 %  02/12/12 1047 - - - 74  11  98 %  02/12/12 1046 - - - 76  14  100 %  02/12/12 1045 - - - 80  14  100 %  02/12/12 1044 - - - 79  14  97 %  02/12/12 1043 - - - 73  16  98 %  02/12/12 1042 121/56 mmHg - - 74  13  96 %  02/12/12 1041 - - - 73  12  96 %  02/12/12 1040 - - - 74  12  98 %  02/12/12 1039 - - - 75  14  100 %  02/12/12 1038 - - - 76  18  100 %  02/12/12 1037 - - - 76  18  100 %  02/12/12 1036 - - - 75  15  100 %  02/12/12 1035 - - - 79  17  100 %  02/12/12 1034 - - - 77  15  100 %  02/12/12 1033 - - - 79  12  100 %  02/12/12 1032 - - - 83  18  100 %  02/12/12 1031 - - - 78  14  100 %  02/12/12 1030 - - - 76  15  100 %  02/12/12 1029 - - - 79  16  100 %  02/12/12 1028 - - - 78  14  100 %  02/12/12 1027 142/61 mmHg - - 76  15  100  %  02/12/12 1026 - - - 81  14  100 %  02/12/12 1025 - - - 78  13  100 %  02/12/12 1024 - - -  79  14  100 %  02/12/12 1023 - - - 80  14  100 %  02/12/12 1022 - - - 80  17  100 %  02/12/12 1021 - - - 80  16  100 %  02/12/12 1020 - - - 80  14  100 %  02/12/12 1019 - - - 83  16  100 %  02/12/12 1018 - - - 83  16  100 %  02/12/12 1017 - - - 82  16  100 %  02/12/12 1016 - - - 86  20  100 %  02/12/12 1015 - - - 88  20  98 %  02/12/12 1014 - - - 90  17  99 %  02/12/12 1013 130/62 mmHg - - 94  29  96 %  02/12/12 1008 130/62 mmHg 98 F (36.7 C) - - - 100 %    @flow {1959:LAST@   Intake/Output from previous day:   10/21 0701 - 10/22 0700 In: 1800 [P.O.:100; I.V.:1700] Out: 2025 [Urine:1400; Drains:550]   Intake/Output this shift:       Intake/Output      10/21 0701 - 10/22 0700 10/22 0701 - 10/23 0700   P.O. 100    I.V. 1700    Total Intake 1800    Urine 1400    Drains 550    Blood 75    Total Output 2025    Net -225            LABORATORY DATA:  Basename 02/13/12 0500  WBC 8.0  HGB 10.4*  HCT 29.1*  PLT PLATELET CLUMPS NOTED ON SMEAR, COUNT APPEARS ADEQUATE    Basename 02/13/12 0500  NA 138  K 3.2*  CL 104  CO2 24  BUN 7  CREATININE 0.55  GLUCOSE 100*  CALCIUM 7.8*   Lab Results  Component Value Date   INR 0.98 02/02/2012    Examination:  General appearance: alert, cooperative and no distress Extremities: Homans sign is negative, no sign of DVT  Wound Exam: clean, dry, intact   Drainage:  Scant/small amount Serosanguinous exudate  Motor Exam: EHL and FHL Intact  Sensory Exam: Deep Peroneal normal  Vascular Exam:    Assessment:    1 Day Post-Op  Procedure(s) (LRB): TOTAL KNEE ARTHROPLASTY (Right)  ADDITIONAL DIAGNOSIS:  Active Problems:  * No active hospital problems. *   Acute Blood Loss Anemia   Plan: Physical Therapy as ordered Weight Bearing as Tolerated (WBAT)  DVT Prophylaxis:  Lovenox  DISCHARGE PLAN: Home  DISCHARGE NEEDS:  HHPT, CPM, Walker and 3-in-1 comode seat         Shequita Peplinski 02/13/2012, 9:21 AM

## 2012-02-13 NOTE — Progress Notes (Signed)
[facility]  Patient Status: New  [facility] is providing the following services: PT  Referral from MD office - thank you!  If patient discharges after hours, please call [phone].   John Lyons 02/13/2012, 12:29 PM

## 2012-02-13 NOTE — Progress Notes (Signed)
Physical Therapy Treatment Patient Details Name: John Lyons MRN: [id number] DOB: 1945/10/13 Today's Date: 02/13/2012 Time: 7829-5621 PT Time Calculation (min): 25 min  PT Assessment / Plan / Recommendation Comments on Treatment Session  Pt tolerated treatment well but was limited by pain and fatigue.  Pt did have mild diaphoresis with initial ambulation but symptoms resolved.  Pt has difficulty with sit to stand transfer and may need assist upon discharge.  Will contine to progress activity as tolerated.     Follow Up Recommendations  Home health PT     Does the patient have the potential to tolerate intense rehabilitation     Barriers to Discharge        Equipment Recommendations  None recommended by PT    Recommendations for Other Services    Frequency 7X/week   Plan Discharge plan remains appropriate    Precautions / Restrictions Precautions Precautions: Knee Restrictions Weight Bearing Restrictions: Yes RLE Weight Bearing: Weight bearing as tolerated   Pertinent Vitals/Pain 5/10    Mobility  Bed Mobility Bed Mobility: Supine to Sit;Sitting - Scoot to Edge of Bed Supine to Sit: 4: Min assist;With rails Sitting - Scoot to [place] of Bed: 4: Min assist;With rail Details for Bed Mobility Assistance: Assist for both LE Transfers Transfers: Sit to Stand;Stand to [employer] Ambulation/Gait Ambulation/Gait Assistance: 4: Min guard Ambulation Distance (Feet): 24 Feet Assistive device: Rolling walker Ambulation/Gait Assistance Details: VC's for sequencing and upright posture Gait Pattern: Step-to pattern;Decreased stride length;Antalgic;Left circumduction (left circumduction due to LLE unable to flex) Gait velocity: decrease General Gait Details: mild unsteadiness with gait Stairs: No    Exercises     PT Diagnosis:    PT Problem List:   PT Treatment Interventions:     PT Goals Acute Rehab PT Goals PT Goal: Supine/Side to Sit - Progress: Progressing toward goal PT  Goal: Stand - Progress: Progressing toward goal PT Goal: Ambulate - Progress: Progressing toward goal  Visit Information  Last PT Received On: 02/13/12 Assistance Needed: +2 PT/OT Co-Evaluation/Treatment: Yes    Subjective Data  Subjective: pt is feeling better today then yesterday Patient Stated Goal: to go home   Cognition  Overall Cognitive Status: Appears within functional limits for tasks assessed/performed Arousal/Alertness: Awake/alert Orientation Level: Appears intact for tasks assessed;Oriented X4 / Intact Behavior During Session: [facility] for tasks performed    Balance  Balance Balance Assessed: Yes High Level Balance High Level Balance Activites: Side stepping;Backward walking;Direction changes;Turns High Level Balance Comments: VC's for sequencing and rw use; guarding for stability  End of Session PT - End of Session Equipment Utilized During Treatment: Gait belt Activity Tolerance: Patient limited by fatigue;Other (comment);Patient limited by pain Patient left: in chair;with call bell/phone within reach;with family/visitor present Nurse Communication: Mobility status   GP     Fabio Asa 02/13/2012, 11:21 AM Charlotte Crumb, PT DPT  [phone]

## 2012-02-13 NOTE — Progress Notes (Signed)
Physical Therapy Treatment Patient Details Name: John Lyons MRN: [id number] DOB: 01-May-1945 Today's Date: 02/13/2012 Time: 4098-1191 PT Time Calculation (min): 32 min  PT Assessment / Plan / Recommendation Comments on Treatment Session  Pt continues to make progress towards PT goals.  Pt is tolerating activity better compared to prior session.  Pt was able to increase ambulation distance.  Main concern is pts inability to perform sit to stand transfer safely due to current deficits in RLE and prior fixed deficits in LLE. Will perform family training tomorrow to see if pt is safe with family assist for transfers.  Pt states that he does have a moterized recliner that stands him up to assist at home as well.  Will also perform stair negotiation tomorrow to assess pts need for assistance to navigate stairs.     Follow Up Recommendations  Home health PT     Does the patient have the potential to tolerate intense rehabilitation     Barriers to Discharge       Equipment Recommendations  None recommended by PT    Recommendations for Other Services    Frequency 7X/week   Plan Discharge plan remains appropriate    Precautions / Restrictions Precautions Precautions: Knee Restrictions Weight Bearing Restrictions: Yes RLE Weight Bearing: Weight bearing as tolerated   Pertinent Vitals/Pain 6/10    Mobility  Bed Mobility Bed Mobility: Sit to Supine;Scooting to HOB Sit to Supine: 4: Min assist;HOB flat Scooting to HOB: 4: Min assist;With trapeze Details for Bed Mobility Assistance: Assist for both LE Transfers Transfers: Sit to Stand;Stand to Sit Sit to Stand: 4: Min assist;With armrests;From chair/3-in-1;Other (comment) (Manual blocking of Right foot to initiate stand) Stand to Sit: 4: Min guard;To bed;To chair/3-in-1;With armrests Details for Transfer Assistance: Pt requires assist to stand secondary to LLE fixed deformity Ambulation/Gait Ambulation/Gait Assistance: 4: Min  guard;5: Supervision Ambulation Distance (Feet): 60 Feet Assistive device: Rolling walker Ambulation/Gait Assistance Details: Initally guard but able to progress to supervision with minimal VC's Gait Pattern: Step-to pattern;Decreased stride length;Antalgic;Left circumduction Gait velocity: decreased General Gait Details: pt appears more steady with ambulation this pm despite pain Stairs: No     PT Goals Acute Rehab PT Goals PT Goal: Sit to Supine/Side - Progress: Progressing toward goal PT Goal: Stand - Progress: Progressing toward goal PT Goal: Ambulate - Progress: Progressing toward goal  Visit Information  Last PT Received On: 02/13/12 Assistance Needed: +2    Subjective Data  Subjective: I just finished eating real food for the first time Patient Stated Goal: to go home   Cognition  Overall Cognitive Status: Appears within functional limits for tasks assessed/performed Arousal/Alertness: Awake/alert Orientation Level: Appears intact for tasks assessed;Oriented X4 / Intact Behavior During Session: [name] for tasks performed       End of Session PT - End of Session Equipment Utilized During Treatment: Gait belt Activity Tolerance: Patient limited by pain;Patient limited by fatigue Patient left: in bed;in CPM;with family/visitor present Nurse Communication: Mobility status   GP     John Lyons 02/13/2012, 4:47 PM John Lyons, PT DPT  [phone]

## 2012-02-13 NOTE — Evaluation (Signed)
I agree with the following treatment note after reviewing documentation.   Johnston, Garek Schuneman Brynn   OTR/L Pager: [phone] Office: [phone] .   

## 2012-02-13 NOTE — Evaluation (Signed)
Occupational Therapy Evaluation Patient Details Name: John Lyons MRN: [id number] DOB: May 03, 1945 Today's Date: 02/13/2012 Time: 6213-0865 OT Time Calculation (min): 24 min  OT Assessment / Plan / Recommendation Clinical Impression  Pt. 66 yo male s/p right TKA. Pt has had prior TKA surgery in LLE with no ROM due to complications. Pt would benefit from OT acutely to address mobility and indepndence with ADLs.     OT Assessment  Patient needs continued OT Services    Follow Up Recommendations  Home health OT    Barriers to Discharge      Equipment Recommendations  None recommended by OT    Recommendations for Other Services    Frequency  Min 2X/week    Precautions / Restrictions Precautions Precautions: Knee Restrictions Weight Bearing Restrictions: Yes RLE Weight Bearing: Weight bearing as tolerated   Pertinent Vitals/Pain 5/10    ADL  Grooming: Performed;Teeth care;Wash/dry face;Min guard Where Assessed - Grooming: Supported standing Upper Body Bathing: Simulated;Supervision/safety Where Assessed - Upper Body Bathing: Unsupported sitting Upper Body Dressing: Performed;Supervision/safety Where Assessed - Upper Body Dressing: Unsupported sitting Toilet Transfer: Simulated;Maximal assistance Toilet Transfer Method: Sit to stand Toilet Transfer Equipment: Raised toilet seat without arms Equipment Used: Gait belt;Rolling walker Transfers/Ambulation Related to ADLs: increased time due to LLE in extension, once standing pt. is min (A) during ambulation ADL Comments: Pt. completed grooming tasks at sink level with min guard for safety, pt. stating his legs felt "wobbly". Able to dress UB with supervision sitting at EOB. Will address LB bathing/dressing and tub transfer in next session.      OT Diagnosis: Generalized weakness;Acute pain  OT Problem List: Decreased activity tolerance;Decreased strength;Decreased safety awareness;Decreased knowledge of precautions;Decreased  knowledge of use of DME or AE;Pain OT Treatment Interventions: Self-care/ADL training;Therapeutic exercise;DME and/or AE instruction;Therapeutic activities;Patient/family education   OT Goals Acute Rehab OT Goals OT Goal Formulation: With patient Time For Goal Achievement: 02/27/12 Potential to Achieve Goals: Good ADL Goals Pt Will Perform Lower Body Bathing: Sit to stand from chair;with 2+ total assist;with adaptive equipment;with supervision ADL Goal: Lower Body Bathing - Progress: Goal set today Pt Will Perform Lower Body Dressing: with supervision;Sit to stand from chair;with adaptive equipment ADL Goal: Lower Body Dressing - Progress: Goal set today Pt Will Transfer to Toilet: with supervision;Regular height toilet;Ambulation ADL Goal: Toilet Transfer - Progress: Goal set today Pt Will Perform Toileting - Clothing Manipulation: with supervision;Standing ADL Goal: Toileting - Clothing Manipulation - Progress: Goal set today Pt Will Perform Toileting - Hygiene: with supervision;Sit to stand from 3-in-1/toilet ADL Goal: Toileting - Hygiene - Progress: Goal set today Pt Will Perform Tub/Shower Transfer: Shower transfer;with supervision;Ambulation ADL Goal: Tub/Shower Transfer - Progress: Goal set today  Visit Information  Last OT Received On: 02/13/12 Assistance Needed: +2    Subjective Data  Subjective: I am feeling much better than yesterday.  Patient Stated Goal: To get moving again and go home.    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 Communication Communication: No difficulties Dominant Hand: Right         Vision/Perception  Cognition  Overall Cognitive Status: Appears within functional limits for tasks assessed/performed Arousal/Alertness: Awake/alert Orientation Level: Appears intact for tasks assessed;Oriented X4 / Intact Behavior During Session: [facility] for tasks performed    Extremity/Trunk Assessment Right Upper Extremity Assessment RUE ROM/Strength/Tone: Deficits RUE ROM/Strength/Tone Deficits: Arthritis Left Lower Extremity Assessment LLE ROM/Strength/Tone: Deficits (no ROM in LLE from prior TKA complications) Trunk Assessment Trunk Assessment: Normal     Mobility Bed Mobility Bed Mobility: Supine to Sit;Sitting - Scoot to Edge of Bed Supine to Sit: 4: Min assist;With rails Sitting - Scoot to [employer] of Bed: 4: Min assist;With rail Details for Bed Mobility Assistance: Assist for both LE Transfers Transfers: Sit to Stand;Stand to Sit Sit to Stand: 2: Max assist;With upper extremity assist;From bed Stand to Sit: 2: Max assist;With upper extremity assist;To bed               Balance Balance Balance Assessed: Yes High Level Balance High Level Balance Activites: Side stepping;Backward walking;Direction changes;Turns High Level Balance Comments: VC's for sequencing and rw use; guarding for stability   End of Session OT - End of Session Equipment Utilized During Treatment: Gait belt Activity Tolerance: Patient tolerated treatment well Patient left: in chair;with call bell/phone within reach;with family/visitor present Nurse Communication: Mobility status  GO     Cleora Fleet 02/13/2012, 12:05 PM

## 2012-02-13 NOTE — Care Management Note (Signed)
    Page 1 of 1   02/13/2012     1:02:37 PM   CARE MANAGEMENT NOTE 02/13/2012  Patient:  John Lyons, John Lyons   Account Number:  [id number]  Date Initiated:  02/12/2012  Documentation initiated by:  Anette Guarneri  Subjective/Objective Assessment:   Right TKA  will need HH serivces and DME     Action/Plan:   [facility] Services with [facility]  DME-[employer]   Anticipated DC Date:  02/15/2012   Anticipated DC Plan:  HOME W HOME HEALTH SERVICES      DC Planning Services  CM consult      Choice offered to / List presented to:             Status of service:  Completed, signed off Medicare Important Message given?   (If response is "NO", the following Medicare IM given date fields will be blank) Date Medicare IM given:   Date Additional Medicare IM given:    Discharge Disposition:    Per UR Regulation:  Reviewed for med. necessity/level of care/duration of stay  If discussed at Long Length of Stay Meetings, dates discussed:    Comments:  02/13/12  13:01  Anette Guarneri RN/CM [facility] services pre-arranged by MD office with [facility] Per Patient he has all DME at home (CPM, RW, 3n1)

## 2012-02-14 LAB — CBC
MCH: 30.2 pg (ref 26.0–34.0)
Platelets: 234 10*3/uL (ref 150–400)
RBC: 3.24 MIL/uL — ABNORMAL LOW (ref 4.22–5.81)
RDW: 13.1 % (ref 11.5–15.5)
WBC: 8.4 10*3/uL (ref 4.0–10.5)

## 2012-02-14 LAB — BASIC METABOLIC PANEL
CO2: 25 mEq/L (ref 19–32)
Calcium: 7.9 mg/dL — ABNORMAL LOW (ref 8.4–10.5)
GFR calc Af Amer: >90 mL/min (ref >90)
Sodium: 141 mEq/L (ref 135–145)

## 2012-02-14 NOTE — Discharge Summary (Signed)
Georgena Spurling, MD   Altamese Cabal, PA-C [address] [place], [place], [place]  [address]                             [phone]  PATIENT ID: John Lyons        MRN:  [id number]          DOB/AGE: 1946-03-09 / 66 y.o.    DISCHARGE SUMMARY  ADMISSION DATE:    02/12/2012 DISCHARGE DATE:   02/14/2012   ADMISSION DIAGNOSIS: osteoarthritis right knee    DISCHARGE DIAGNOSIS:  osteoarthritis right knee    ADDITIONAL DIAGNOSIS: Active Problems:  * No active hospital problems. *   Past Medical History  Diagnosis Date  . Addison's disease   . PONV (postoperative nausea and vomiting)   . Rheumatoid arthritis   . Hypertension   . H/O hiatal hernia   . GERD (gastroesophageal reflux disease)   . Dysrhythmia     hx rapid heart rate in 70s, on digoxin    PROCEDURE: Procedure(s): TOTAL KNEE ARTHROPLASTY on 02/12/2012  CONSULTS:     HISTORY:  See H&P in chart  HOSPITAL COURSE:  Estiven Phillipe is a 66 y.o. admitted on 02/12/2012 and found to have a diagnosis of osteoarthritis right knee.  After appropriate laboratory studies were obtained  they were taken to the operating room on 02/12/2012 and underwent Procedure(s): TOTAL KNEE ARTHROPLASTY.   They were given perioperative antibiotics:  Anti-infectives     Start     Dose/Rate Route Frequency Ordered Stop   02-28-12 1000   hydroxychloroquine (PLAQUENIL) tablet 200 mg        200 mg Oral Daily 02/12/12 1144     02/12/12 1330   ceFAZolin (ANCEF) IVPB 1 g/50 mL premix        1 g 100 mL/hr over 30 Minutes Intravenous Every 6 hours 02/12/12 1144 02/12/12 2100   02/11/12 1359   ceFAZolin (ANCEF) IVPB 2 g/50 mL premix        2 g 100 mL/hr over 30 Minutes Intravenous 60 min pre-op 02/11/12 1359 02/12/12 0742        .  Tolerated the procedure well.  Placed with a foley intraoperatively.  Given Ofirmev at induction and for 48 hours.    POD #1, allowed out of bed to a chair.  PT for ambulation and exercise program.  Foley D/C'd  in morning.  IV saline locked.  O2 discontionued.  POD #2, continued PT and ambulation.   Hemovac pulled. .  The remainder of the hospital course was dedicated to ambulation and strengthening.   The patient was discharged on 2 Days Post-Op in  Good condition.  Blood products given:none  DIAGNOSTIC STUDIES: Recent vital signs: Patient Vitals for the past 24 hrs:  BP Temp Temp src Pulse Resp SpO2  02/14/12 0800 - - - - 16  95 %  02/14/12 0506 140/46 mmHg 100.3 F (37.9 C) Oral 96  16  95 %  2012/02/28 2143 124/48 mmHg 100.1 F (37.8 C) Oral 94  18  94 %  02/28/2012 1951 - - - - 18  94 %  02-28-12 1600 - - - - 18  96 %  02/28/2012 1400 126/49 mmHg 99.6 F (37.6 C) - 88  18  96 %       Recent laboratory studies:  Basename 02/14/12 0610 February 28, 2012 0500  WBC 8.4 8.0  HGB 9.8* 10.4*  HCT 27.5* 29.1*  PLT 234 PLATELET CLUMPS NOTED ON SMEAR, COUNT APPEARS ADEQUATE    Basename 02/14/12 0610 02/13/12 0500  NA 141 138  K 2.9* 3.2*  CL 105 104  CO2 25 24  BUN 7 7  CREATININE 0.47* 0.55  GLUCOSE 105* 100*  CALCIUM 7.9* 7.8*   Lab Results  Component Value Date   INR 0.98 02/02/2012     Recent Radiographic Studies :  Dg Chest 2 View  02/02/2012  *RADIOLOGY REPORT*  Clinical Data: History of hypertension, hiatal hernia, and reflux.  CHEST - 2 VIEW  Comparison: 05/24/2004.  Findings: Cardiac silhouette is normal size.  There is ectasia of thoracic aorta.  Mediastinal and hilar contours appear stable. Slight increased markings are present within the lingula. These most likely are fibrotic in origin.  There is flattening of diaphragm on lateral image with generalized hyperinflation configuration consistent with element of COPD.  No pulmonary consolidation or pleural effusion is seen.  There is central peribronchial thickening.  Changes of degenerative disc disease and degenerative spondylosis are present.  IMPRESSION: Slight increased markings and lingula most likely fibrotic. Hyperinflation  configuration cyst with COPD.  No pulmonary consolidation or pleural effusion.  Central peribronchial thickening.  This may be associated with bronchitis, asthma, and reactive airway disease.   Original Report Authenticated By: Crawford Givens, M.D.    Dg C-arm 1-60 Min-no Report  02/12/2012  CLINICAL DATA: right toal knee   C-ARM 1-60 MINUTES  Fluoroscopy was utilized by the requesting physician.  No radiographic  interpretation.      DISCHARGE INSTRUCTIONS: Discharge Orders    Future Orders Please Complete By Expires   Diet - low sodium heart healthy      Call MD / Call 911      Comments:   If you experience chest pain or shortness of breath, CALL 911 and be transported to the hospital emergency room.  If you develope a fever above 101 F, pus (white drainage) or increased drainage or redness at the wound, or calf pain, call your surgeon's office.   Constipation Prevention      Comments:   Drink plenty of fluids.  Prune juice may be helpful.  You may use a stool softener, such as Colace (over the counter) 100 mg twice a day.  Use MiraLax (over the counter) for constipation as needed.   Increase activity slowly as tolerated      Driving restrictions      Comments:   No driving for 6 weeks   Lifting restrictions      Comments:   No lifting for 6 weeks   CPM      Comments:   Continuous passive motion machine (CPM):      Use the CPM from 0 to 90 for 6-8 hours per day.      You may increase by 10 per day.  You may break it up into 2 or 3 sessions per day.      Use CPM for 2 weeks or until you are told to stop.   TED hose      Comments:   Use stockings (TED hose) for 3 weeks on both leg(s).  You may remove them at night for sleeping.   Change dressing      Comments:   Change dressing on thursday, then change the dressing daily with sterile 4 x 4 inch gauze dressing and apply TED hose.  You may clean the incision with alcohol prior to redressing.   Do not put  a pillow under the knee. Place  it under the heel.         DISCHARGE MEDICATIONS:     Medication List     As of 02/14/2012  1:05 PM    STOP taking these medications         [employer] FISH OIL 1200 MG Cpdr      TAKE these medications         acetaminophen 500 MG tablet   Commonly known as: TYLENOL   Take 500 mg by mouth every 6 (six) hours as needed. For pain      ALPRAZolam 0.25 MG tablet   Commonly known as: XANAX   Take 0.25 mg by mouth at bedtime.      calcium-vitamin D 500-200 MG-UNIT per tablet   Take 2 tablets by mouth daily.      digoxin 0.125 MG tablet   Commonly known as: LANOXIN   Take 0.125 mg by mouth daily.      enoxaparin 40 MG/0.4ML injection   Commonly known as: LOVENOX   Inject 0.4 mLs (40 mg total) into the skin daily.      EQL FLAX SEED OIL 1000 MG Caps   Take 1,000 mg by mouth daily.      fludrocortisone 0.1 MG tablet   Commonly known as: FLORINEF   Take 0.1 mg by mouth daily.      fluocinonide cream 0.05 %   Commonly known as: LIDEX   Apply topically daily as needed.      folic acid 400 MCG tablet   Commonly known as: FOLVITE   Take 400 mcg by mouth daily.      hydrocortisone 20 MG tablet   Commonly known as: CORTEF   Take 30 mg by mouth daily.      hydroxychloroquine 200 MG tablet   Commonly known as: PLAQUENIL   Take 200 mg by mouth daily.      ibandronate 150 MG tablet   Commonly known as: BONIVA   Take 150 mg by mouth every 30 (thirty) days. 1st weekend of the month;   Take in the morning with a full glass of water, on an empty stomach, and do not take anything else by mouth or lie down for the next 30 min.      methocarbamol 500 MG tablet   Commonly known as: ROBAXIN   Take 1 tablet (500 mg total) by mouth every 6 (six) hours as needed.      metroNIDAZOLE 1 % gel   Commonly known as: METROGEL   Apply topically daily as needed.      multivitamin with minerals Tabs   Take 1 tablet by mouth daily.      mupirocin cream 2 %   Commonly known as: BACTROBAN    Apply topically daily as needed.      naproxen sodium 220 MG tablet   Commonly known as: ANAPROX   Take 220 mg by mouth 2 (two) times daily with a meal.      olopatadine 0.1 % ophthalmic solution   Commonly known as: PATANOL   Place 1 drop into both eyes 2 (two) times daily as needed. For allergies      oxyCODONE 5 MG immediate release tablet   Commonly known as: Oxy IR/ROXICODONE   Take 1-2 tablets (5-10 mg total) by mouth every 4 (four) hours as needed.      OxyCODONE 10 mg Tb12   Commonly known as: OXYCONTIN   Take 1 tablet (10  mg total) by mouth every 12 (twelve) hours.      pantoprazole 40 MG tablet   Commonly known as: PROTONIX   Take 40 mg by mouth daily.      pravastatin 20 MG tablet   Commonly known as: PRAVACHOL   Take 20 mg by mouth daily.      telmisartan 80 MG tablet   Commonly known as: MICARDIS   Take 40 mg by mouth daily.      triamcinolone 55 MCG/ACT nasal inhaler   Commonly known as: NASACORT   Place 2 sprays into the nose daily as needed. For allergies      Vitamin D3 1000 UNITS Caps   Take 2,000 Units by mouth daily.        FOLLOW UP VISIT:       Follow-up Information    Follow up with Raymon Mutton, MD. Call on 02/27/2012.   Contact information:   [address] [place] [address] [phone]          DISPOSITION:  home    CONDITION:  {Good  Ryson Bacha 02/14/2012, 1:05 PM

## 2012-02-14 NOTE — Progress Notes (Signed)
SPORTS MEDICINE AND JOINT REPLACEMENT  Georgena Spurling, MD   Altamese Cabal, PA-C [address] [place], [place], [place]  [address]                             [phone]   PROGRESS NOTE  Subjective:  negative for Chest Pain  negative for Shortness of Breath  negative for Nausea/Vomiting   negative for Calf Pain  negative for Bowel Movement   Tolerating Diet: yes         Patient reports pain as 5 on 0-10 scale.    Objective: Vital signs in last 24 hours:   Patient Vitals for the past 24 hrs:  BP Temp Temp src Pulse Resp SpO2  02/14/12 0800 - - - - 16  95 %  02/14/12 0506 140/46 mmHg 100.3 F (37.9 C) Oral 96  16  95 %  02/13/12 2143 124/48 mmHg 100.1 F (37.8 C) Oral 94  18  94 %  02/13/12 1951 - - - - 18  94 %  02/13/12 1600 - - - - 18  96 %  02/13/12 1400 126/49 mmHg 99.6 F (37.6 C) - 88  18  96 %    @flow {1959:LAST@   Intake/Output from previous day:   10/22 0701 - 10/23 0700 In: 720 [P.O.:720] Out: 400 [Urine:400]   Intake/Output this shift:       Intake/Output      10/22 0701 - 10/23 0700 10/23 0701 - 10/24 0700   P.O. 720    I.V.     Total Intake 720    Urine 400    Drains     Blood     Total Output 400    Net +320            LABORATORY DATA:  Basename 02/14/12 0610 02/13/12 0500  WBC 8.4 8.0  HGB 9.8* 10.4*  HCT 27.5* 29.1*  PLT 234 PLATELET CLUMPS NOTED ON SMEAR, COUNT APPEARS ADEQUATE    Basename 02/14/12 0610 02/13/12 0500  NA 141 138  K 2.9* 3.2*  CL 105 104  CO2 25 24  BUN 7 7  CREATININE 0.47* 0.55  GLUCOSE 105* 100*  CALCIUM 7.9* 7.8*   Lab Results  Component Value Date   INR 0.98 02/02/2012    Examination:  General appearance: alert, cooperative and no distress Extremities: Homans sign is negative, no sign of DVT  Wound Exam: clean, dry, intact   Drainage:  Scant/small amount Serosanguinous exudate  Motor Exam: EHL and FHL Intact  Sensory Exam: Deep Peroneal normal  Vascular Exam:    Assessment:    2 Days  Post-Op  Procedure(s) (LRB): TOTAL KNEE ARTHROPLASTY (Right)  ADDITIONAL DIAGNOSIS:  Active Problems:  * No active hospital problems. *   Acute Blood Loss Anemia   Plan: Physical Therapy as ordered Weight Bearing as Tolerated (WBAT)  DVT Prophylaxis:  Lovenox  DISCHARGE PLAN: Home  DISCHARGE NEEDS: HHPT, CPM, Walker and 3-in-1 comode seat         Harmonee Tozer 02/14/2012, 12:30 PM

## 2012-02-14 NOTE — Progress Notes (Signed)
Physical Therapy Treatment Patient Details Name: John Lyons MRN: [id number] DOB: March 31, 1946 Today's Date: 02/14/2012 Time: 5784-6962 PT Time Calculation (min): 25 min  PT Assessment / Plan / Recommendation Comments on Treatment Session  Pt still requires assist with transfers but was able to perform with more stability today.  Pt was also able to improve ambulation distance and velocity. Pt had difficulty with ther ex as range is still significantly limited. Discussed with pt and spouse importance on performing activities without relying solely on assist. Will continue to progress activity as tolerated.  Perform step negotiation this afternoon and family training for transfers.    Follow Up Recommendations  Home health PT     Does the patient have the potential to tolerate intense rehabilitation     Barriers to Discharge        Equipment Recommendations  None recommended by PT    Recommendations for Other Services    Frequency 7X/week   Plan Discharge plan remains appropriate    Precautions / Restrictions Precautions Precautions: Knee Restrictions Weight Bearing Restrictions: Yes RLE Weight Bearing: Weight bearing as tolerated   Pertinent Vitals/Pain 6/10    Mobility  Bed Mobility Bed Mobility: Not assessed Transfers Transfers: Sit to Stand;Stand to Sit Sit to Stand: 4: Min assist;With armrests;From chair/3-in-1;Other (comment) (Manual blocking of Right foot to initiate stand) Stand to Sit: 4: Min guard;To chair/3-in-1;With armrests Details for Transfer Assistance: Pt requires assist to stand secondary to LLE fixed deformity Ambulation/Gait Ambulation/Gait Assistance: 5: Supervision Ambulation Distance (Feet): 110 Feet Assistive device: Rolling walker Ambulation/Gait Assistance Details: Pt is very rigid with amb. and requires VC's for upright posture Gait Pattern: Step-to pattern;Decreased stride length;Antalgic;Left circumduction Gait velocity: decreased General  Gait Details: Pt req VC's for knee flexion RLE during ambulation; pt usees split finger hand placement for comfort on rw Stairs: No    Exercises Total Joint Exercises Ankle Circles/Pumps: AROM;20 reps;Both Quad Sets: AROM;Right;10 reps Short Arc Quad: AROM;20 reps;Right Straight Leg Raises: AAROM;Right;10 reps Knee Flexion: PROM;Right;10 reps    PT Goals Acute Rehab PT Goals Pt will Stand: with modified independence PT Goal: Stand - Progress: Progressing toward goal Pt will Ambulate: >150 feet;with modified independence;with rolling walker PT Goal: Ambulate - Progress: Progressing toward goal Pt will Perform Home Exercise Program: with supervision, verbal cues required/provided PT Goal: Perform Home Exercise Program - Progress: Progressing toward goal  Visit Information  Last PT Received On: 02/14/12 Assistance Needed: +1    Subjective Data  Subjective: I don't feel as good as I did yesterday Patient Stated Goal: to go home   Cognition  Overall Cognitive Status: Appears within functional limits for tasks assessed/performed Arousal/Alertness: Awake/alert Orientation Level: Appears intact for tasks assessed;Oriented X4 / Intact Behavior During Session: [facility] for tasks performed    Balance  High Level Balance High Level Balance Activites: Side stepping;Backward walking;Direction changes;Turns High Level Balance Comments: steady with activities today  End of Session PT - End of Session Equipment Utilized During Treatment: Gait belt Activity Tolerance: Patient limited by pain;Patient limited by fatigue Patient left: with family/visitor present;in chair Nurse Communication: Mobility status CPM Right Knee CPM Right Knee: Off   GP     Fabio Asa 02/14/2012, 11:46 AM Charlotte Crumb, PT DPT  [phone]

## 2012-02-14 NOTE — Op Note (Signed)
NAMESHAD, LEDVINA              ACCOUNT NO.:  [id number]  MEDICAL RECORD NO.:  [id number]  LOCATION:  5N12C                        FACILITY:  [facility]  PHYSICIAN:  Mila Homer. Sherlean Foot, M.D. DATE OF BIRTH:  07/05/1945  DATE OF PROCEDURE:  02/12/2012 DATE OF DISCHARGE:                              OPERATIVE REPORT   SURGEON:  Mila Homer. Sherlean Foot, M.D.  FIRST ASSISTANT:  Altamese Cabal, PA-C  ANESTHESIA:  General.  PREOPERATIVE DIAGNOSIS:  Right knee osteoarthritis and posttraumatic arthritis.  POSTOPERATIVE DIAGNOSIS:  Right knee osteoarthritis and posttraumatic arthritis.  PROCEDURE:  Right total knee arthroplasty.  INDICATION FOR PROCEDURE:  The patient is 66 years old with distal 3rd shaft femur fracture, which is old with internal rotation, varus alignment, significant contractures with preoperative range of motion 0- 40 degrees.  PROCEDURE:  The patient was laid supine and administered general anesthesia.  Right leg prepped and draped usual in sterile fashion. Preoperative range of motion 10 to 40 degrees.  After sterile prep and drape, the extremity was exsanguinated.  Tourniquet inflated to 300 mmHg and set for an hour.  Midline incision was made with a #10 blade.  New blade was used to make a median parapatellar arthrotomy.  The quadriceps was also tight.  There was adhesions between the retropatellar fat pad and the intercondylar notch.  Once it was released, I was able to get to 75 degrees.  Then I removed small osteophytes and continued quad extension until I got to the muscle, but not any further.  I then completed my dissection of the deep MCL off the tibia round to and through the semimembranosus tendon.  At this point, again to 90 degrees. I then once again everted the patella, recreated the thickness of the patella and placed in a 29-mm patella.  I then went back into flexion using the extramedullary alignment system to make a perpendicular cut to the anatomic axis  of the tibia.  The bone was very soft.  I then turned my attention to the femur, again the distal femur was internally rotated.  I did mark out the epicondylar axis and measured 5 degrees.  I sized to a C, pinned to the 5 degree external rotation holes and cut the anterior, posterior, and chamfer cuts through the 4:1 cutting block. Laterally I removed the popliteus pie crusted IT band and posterolateral corner.  I did have to re-cut my tibia plica to get flexion-extension gap balance.  Once I obtained this with a 10-mm spacer block, I finished the femur for C, the tibia for a 3 tray and trial with a C femur, 10 poly insert with a 3 tibial tray, and 29 patella.  I had good flexion/extension gap balance and good patellar tracking.  Of note, is that I did have to use the extramedullary alignment system, and a C-arm to check my distal femoral cut prior to accepting that to create anatomic alignment of the mechanical axis.  I then lavaged and allowed the cement to harden.  I closed the arthrotomy with figure-of-eight #1 sutures.  I left a Hemovac coming out deep arthrotomy and superficially, pain catheter coming out superficial on the superior medial aspect of the wound.  Closed the deep soft tissue with buried 0-Vicryl sutures, subcuticular 2-0 Vicryl sutures, and skin staples.  Dressed with Xeroform, dressing sponges, sterile Webril, and stocking.  COMPLICATIONS:  None.  DRAINS:  One Hemovac, 1 pain catheter.  EBL:  300 mL.  TOURNIQUET TIME:  Around 20 minutes.          ______________________________ Mila Homer Sherlean Foot, M.D.     SDL/MEDQ  D:  02/13/2012  T:  02/14/2012  Job:  [id number]

## 2012-02-14 NOTE — Plan of Care (Signed)
Problem: Phase II Progression Outcomes Goal: Ambulates Outcome: Completed/Met Date Met:  02/14/12 Pt able to ambulate 120 ft at this time with close supervision

## 2012-02-14 NOTE — Progress Notes (Signed)
Pt discharged to home accompanied by wife and family. Discharge instructions and rx given and explained.  Pt stated understanding. Pt instructed to follow up with MD Lucey on 02/27/12. Pt left unit in stable condition via wheelchair.

## 2012-02-14 NOTE — Progress Notes (Signed)
Physical Therapy Treatment Patient Details Name: John Lyons MRN: [id number] DOB: 05-31-1945 Today's Date: 02/14/2012 Time: 6295-2841 PT Time Calculation (min): 25 min  PT Assessment / Plan / Recommendation Comments on Treatment Session  Pt was able to perform family education and transfers with brother. Pt able to perform step negotaition safely.  Pt educated on car transfer and all concerns for discharge addressed.    Follow Up Recommendations  Home health PT     Does the patient have the potential to tolerate intense rehabilitation     Barriers to Discharge        Equipment Recommendations  None recommended by PT    Recommendations for Other Services    Frequency 7X/week   Plan Discharge plan remains appropriate    Precautions / Restrictions Precautions Precautions: Knee Restrictions Weight Bearing Restrictions: Yes RLE Weight Bearing: Weight bearing as tolerated   Pertinent Vitals/Pain 4/10    Mobility  Bed Mobility Bed Mobility: Sit to Supine;Scooting to HOB Sit to Supine: 5: Supervision (self assist from LLE) Scooting to HOB: 4: Min assist;With trapeze Transfers Transfers: Sit to Stand;Stand to Sit Sit to Stand: 4: Min assist;With armrests;From chair/3-in-1;Other (comment) Stand to Sit: 4: Min guard;To chair/3-in-1;With armrests Details for Transfer Assistance: Pt brother able to perform safe assist for transfer with proper technique Ambulation/Gait Ambulation/Gait Assistance: 5: Supervision Ambulation Distance (Feet): 40 Feet Assistive device: Rolling walker Ambulation/Gait Assistance Details: Pt is very rigid with amb. and requires VC's for upright posture Gait Pattern: Step-to pattern;Decreased stride length;Antalgic;Left circumduction Gait velocity: decreased General Gait Details: VC's for upright posture Stairs: Yes Stairs Assistance: 4: Min guard;5: Supervision Stairs Assistance Details (indicate cue type and reason): Instructed on technique Stair  Management Technique: No rails;With walker Number of Stairs: 2     Exercises Total Joint Exercises Ankle Circles/Pumps: AROM;20 reps;Both Quad Sets: AROM;Right;10 reps Short Arc Quad: AROM;20 reps;Right Straight Leg Raises: AAROM;Right;10 reps Knee Flexion: PROM;Right;10 reps    PT Goals Acute Rehab PT Goals PT Goal Formulation: With patient Time For Goal Achievement: 02/17/12 Potential to Achieve Goals: Good Pt will go Supine/Side to Sit: with modified independence PT Goal: Supine/Side to Sit - Progress: Progressing toward goal Pt will go Sit to Supine/Side: with modified independence PT Goal: Sit to Supine/Side - Progress: Progressing toward goal Pt will Stand: with modified independence PT Goal: Stand - Progress: Progressing toward goal Pt will Ambulate: >150 feet;with modified independence;with rolling walker PT Goal: Ambulate - Progress: Progressing toward goal Pt will Go Up / Down Stairs: 1-2 stairs;with min assist PT Goal: Up/Down Stairs - Progress: Met Pt will Perform Home Exercise Program: with supervision, verbal cues required/provided PT Goal: Perform Home Exercise Program - Progress: Progressing toward goal  Visit Information  Last PT Received On: 02/14/12 Assistance Needed: +1    Subjective Data  Subjective: Dr said i could go home if I do well Patient Stated Goal: to go home   Cognition  Overall Cognitive Status: Appears within functional limits for tasks assessed/performed Arousal/Alertness: Awake/alert Orientation Level: Appears intact for tasks assessed;Oriented X4 / Intact Behavior During Session: [facility] for tasks performed    Balance  High Level Balance High Level Balance Activites: Side stepping;Backward walking;Direction changes;Turns High Level Balance Comments: steady with activities today  End of Session PT - End of Session Equipment Utilized During Treatment: Gait belt Activity Tolerance: Patient tolerated treatment well Patient left: with  family/visitor present;in bed;in CPM Nurse Communication: Mobility status   GP     Fabio Asa 02/14/2012,  2:41 PM Charlotte Crumb, PT DPT  [phone]

## 2012-02-15 DIAGNOSIS — G8918 Other acute postprocedural pain: Secondary | ICD-10-CM | POA: Diagnosis not present

## 2012-02-15 DIAGNOSIS — R269 Unspecified abnormalities of gait and mobility: Secondary | ICD-10-CM | POA: Diagnosis not present

## 2012-02-15 DIAGNOSIS — M6281 Muscle weakness (generalized): Secondary | ICD-10-CM | POA: Diagnosis not present

## 2012-02-15 DIAGNOSIS — M069 Rheumatoid arthritis, unspecified: Secondary | ICD-10-CM | POA: Diagnosis not present

## 2012-02-15 DIAGNOSIS — Z471 Aftercare following joint replacement surgery: Secondary | ICD-10-CM | POA: Diagnosis not present

## 2012-02-15 DIAGNOSIS — I10 Essential (primary) hypertension: Secondary | ICD-10-CM | POA: Diagnosis not present

## 2012-02-16 DIAGNOSIS — M6281 Muscle weakness (generalized): Secondary | ICD-10-CM | POA: Diagnosis not present

## 2012-02-16 DIAGNOSIS — R269 Unspecified abnormalities of gait and mobility: Secondary | ICD-10-CM | POA: Diagnosis not present

## 2012-02-16 DIAGNOSIS — G8918 Other acute postprocedural pain: Secondary | ICD-10-CM | POA: Diagnosis not present

## 2012-02-16 DIAGNOSIS — M069 Rheumatoid arthritis, unspecified: Secondary | ICD-10-CM | POA: Diagnosis not present

## 2012-02-16 DIAGNOSIS — I10 Essential (primary) hypertension: Secondary | ICD-10-CM | POA: Diagnosis not present

## 2012-02-16 DIAGNOSIS — Z471 Aftercare following joint replacement surgery: Secondary | ICD-10-CM | POA: Diagnosis not present

## 2012-02-19 DIAGNOSIS — M6281 Muscle weakness (generalized): Secondary | ICD-10-CM | POA: Diagnosis not present

## 2012-02-19 DIAGNOSIS — Z471 Aftercare following joint replacement surgery: Secondary | ICD-10-CM | POA: Diagnosis not present

## 2012-02-19 DIAGNOSIS — I10 Essential (primary) hypertension: Secondary | ICD-10-CM | POA: Diagnosis not present

## 2012-02-19 DIAGNOSIS — R269 Unspecified abnormalities of gait and mobility: Secondary | ICD-10-CM | POA: Diagnosis not present

## 2012-02-19 DIAGNOSIS — G8918 Other acute postprocedural pain: Secondary | ICD-10-CM | POA: Diagnosis not present

## 2012-02-19 DIAGNOSIS — M069 Rheumatoid arthritis, unspecified: Secondary | ICD-10-CM | POA: Diagnosis not present

## 2012-02-20 DIAGNOSIS — M6281 Muscle weakness (generalized): Secondary | ICD-10-CM | POA: Diagnosis not present

## 2012-02-20 DIAGNOSIS — R269 Unspecified abnormalities of gait and mobility: Secondary | ICD-10-CM | POA: Diagnosis not present

## 2012-02-20 DIAGNOSIS — Z471 Aftercare following joint replacement surgery: Secondary | ICD-10-CM | POA: Diagnosis not present

## 2012-02-20 DIAGNOSIS — G8918 Other acute postprocedural pain: Secondary | ICD-10-CM | POA: Diagnosis not present

## 2012-02-20 DIAGNOSIS — M069 Rheumatoid arthritis, unspecified: Secondary | ICD-10-CM | POA: Diagnosis not present

## 2012-02-20 DIAGNOSIS — I10 Essential (primary) hypertension: Secondary | ICD-10-CM | POA: Diagnosis not present

## 2012-02-21 DIAGNOSIS — Z471 Aftercare following joint replacement surgery: Secondary | ICD-10-CM | POA: Diagnosis not present

## 2012-02-21 DIAGNOSIS — I10 Essential (primary) hypertension: Secondary | ICD-10-CM | POA: Diagnosis not present

## 2012-02-21 DIAGNOSIS — R269 Unspecified abnormalities of gait and mobility: Secondary | ICD-10-CM | POA: Diagnosis not present

## 2012-02-21 DIAGNOSIS — G8918 Other acute postprocedural pain: Secondary | ICD-10-CM | POA: Diagnosis not present

## 2012-02-21 DIAGNOSIS — M069 Rheumatoid arthritis, unspecified: Secondary | ICD-10-CM | POA: Diagnosis not present

## 2012-02-21 DIAGNOSIS — M6281 Muscle weakness (generalized): Secondary | ICD-10-CM | POA: Diagnosis not present

## 2012-02-22 DIAGNOSIS — M069 Rheumatoid arthritis, unspecified: Secondary | ICD-10-CM | POA: Diagnosis not present

## 2012-02-22 DIAGNOSIS — M6281 Muscle weakness (generalized): Secondary | ICD-10-CM | POA: Diagnosis not present

## 2012-02-22 DIAGNOSIS — G8918 Other acute postprocedural pain: Secondary | ICD-10-CM | POA: Diagnosis not present

## 2012-02-22 DIAGNOSIS — I10 Essential (primary) hypertension: Secondary | ICD-10-CM | POA: Diagnosis not present

## 2012-02-22 DIAGNOSIS — R269 Unspecified abnormalities of gait and mobility: Secondary | ICD-10-CM | POA: Diagnosis not present

## 2012-02-22 DIAGNOSIS — Z471 Aftercare following joint replacement surgery: Secondary | ICD-10-CM | POA: Diagnosis not present

## 2012-02-23 DIAGNOSIS — M069 Rheumatoid arthritis, unspecified: Secondary | ICD-10-CM | POA: Diagnosis not present

## 2012-02-23 DIAGNOSIS — R269 Unspecified abnormalities of gait and mobility: Secondary | ICD-10-CM | POA: Diagnosis not present

## 2012-02-23 DIAGNOSIS — I10 Essential (primary) hypertension: Secondary | ICD-10-CM | POA: Diagnosis not present

## 2012-02-23 DIAGNOSIS — Z471 Aftercare following joint replacement surgery: Secondary | ICD-10-CM | POA: Diagnosis not present

## 2012-02-23 DIAGNOSIS — M6281 Muscle weakness (generalized): Secondary | ICD-10-CM | POA: Diagnosis not present

## 2012-02-23 DIAGNOSIS — G8918 Other acute postprocedural pain: Secondary | ICD-10-CM | POA: Diagnosis not present

## 2012-02-26 DIAGNOSIS — D509 Iron deficiency anemia, unspecified: Secondary | ICD-10-CM | POA: Diagnosis not present

## 2012-02-26 DIAGNOSIS — Z471 Aftercare following joint replacement surgery: Secondary | ICD-10-CM | POA: Diagnosis not present

## 2012-02-26 DIAGNOSIS — M069 Rheumatoid arthritis, unspecified: Secondary | ICD-10-CM | POA: Diagnosis not present

## 2012-02-26 DIAGNOSIS — R269 Unspecified abnormalities of gait and mobility: Secondary | ICD-10-CM | POA: Diagnosis not present

## 2012-02-26 DIAGNOSIS — I10 Essential (primary) hypertension: Secondary | ICD-10-CM | POA: Diagnosis not present

## 2012-02-26 DIAGNOSIS — M6281 Muscle weakness (generalized): Secondary | ICD-10-CM | POA: Diagnosis not present

## 2012-02-26 DIAGNOSIS — G8918 Other acute postprocedural pain: Secondary | ICD-10-CM | POA: Diagnosis not present

## 2012-02-27 DIAGNOSIS — R269 Unspecified abnormalities of gait and mobility: Secondary | ICD-10-CM | POA: Diagnosis not present

## 2012-02-27 DIAGNOSIS — I10 Essential (primary) hypertension: Secondary | ICD-10-CM | POA: Diagnosis not present

## 2012-02-27 DIAGNOSIS — M6281 Muscle weakness (generalized): Secondary | ICD-10-CM | POA: Diagnosis not present

## 2012-02-27 DIAGNOSIS — G8918 Other acute postprocedural pain: Secondary | ICD-10-CM | POA: Diagnosis not present

## 2012-02-27 DIAGNOSIS — Z471 Aftercare following joint replacement surgery: Secondary | ICD-10-CM | POA: Diagnosis not present

## 2012-02-27 DIAGNOSIS — M069 Rheumatoid arthritis, unspecified: Secondary | ICD-10-CM | POA: Diagnosis not present

## 2012-02-27 DIAGNOSIS — M25569 Pain in unspecified knee: Secondary | ICD-10-CM | POA: Diagnosis not present

## 2012-02-28 DIAGNOSIS — R269 Unspecified abnormalities of gait and mobility: Secondary | ICD-10-CM | POA: Diagnosis not present

## 2012-02-28 DIAGNOSIS — G8918 Other acute postprocedural pain: Secondary | ICD-10-CM | POA: Diagnosis not present

## 2012-02-28 DIAGNOSIS — M069 Rheumatoid arthritis, unspecified: Secondary | ICD-10-CM | POA: Diagnosis not present

## 2012-02-28 DIAGNOSIS — M6281 Muscle weakness (generalized): Secondary | ICD-10-CM | POA: Diagnosis not present

## 2012-02-28 DIAGNOSIS — Z471 Aftercare following joint replacement surgery: Secondary | ICD-10-CM | POA: Diagnosis not present

## 2012-02-28 DIAGNOSIS — I10 Essential (primary) hypertension: Secondary | ICD-10-CM | POA: Diagnosis not present

## 2012-03-01 DIAGNOSIS — M175 Other unilateral secondary osteoarthritis of knee: Secondary | ICD-10-CM | POA: Diagnosis not present

## 2012-03-04 DIAGNOSIS — M175 Other unilateral secondary osteoarthritis of knee: Secondary | ICD-10-CM | POA: Diagnosis not present

## 2012-03-06 DIAGNOSIS — M175 Other unilateral secondary osteoarthritis of knee: Secondary | ICD-10-CM | POA: Diagnosis not present

## 2012-03-08 DIAGNOSIS — M175 Other unilateral secondary osteoarthritis of knee: Secondary | ICD-10-CM | POA: Diagnosis not present

## 2012-03-11 DIAGNOSIS — M175 Other unilateral secondary osteoarthritis of knee: Secondary | ICD-10-CM | POA: Diagnosis not present

## 2012-03-12 DIAGNOSIS — D509 Iron deficiency anemia, unspecified: Secondary | ICD-10-CM | POA: Diagnosis not present

## 2012-03-12 DIAGNOSIS — D473 Essential (hemorrhagic) thrombocythemia: Secondary | ICD-10-CM | POA: Diagnosis not present

## 2012-03-12 DIAGNOSIS — I10 Essential (primary) hypertension: Secondary | ICD-10-CM | POA: Diagnosis not present

## 2012-03-12 DIAGNOSIS — Z1331 Encounter for screening for depression: Secondary | ICD-10-CM | POA: Diagnosis not present

## 2012-03-12 DIAGNOSIS — M069 Rheumatoid arthritis, unspecified: Secondary | ICD-10-CM | POA: Diagnosis not present

## 2012-03-13 DIAGNOSIS — M175 Other unilateral secondary osteoarthritis of knee: Secondary | ICD-10-CM | POA: Diagnosis not present

## 2012-03-15 DIAGNOSIS — M175 Other unilateral secondary osteoarthritis of knee: Secondary | ICD-10-CM | POA: Diagnosis not present

## 2012-03-18 DIAGNOSIS — M175 Other unilateral secondary osteoarthritis of knee: Secondary | ICD-10-CM | POA: Diagnosis not present

## 2012-03-20 DIAGNOSIS — M175 Other unilateral secondary osteoarthritis of knee: Secondary | ICD-10-CM | POA: Diagnosis not present

## 2012-03-22 DIAGNOSIS — M175 Other unilateral secondary osteoarthritis of knee: Secondary | ICD-10-CM | POA: Diagnosis not present

## 2012-03-25 DIAGNOSIS — M175 Other unilateral secondary osteoarthritis of knee: Secondary | ICD-10-CM | POA: Diagnosis not present

## 2012-03-27 DIAGNOSIS — M175 Other unilateral secondary osteoarthritis of knee: Secondary | ICD-10-CM | POA: Diagnosis not present

## 2012-03-29 DIAGNOSIS — M175 Other unilateral secondary osteoarthritis of knee: Secondary | ICD-10-CM | POA: Diagnosis not present

## 2012-04-01 DIAGNOSIS — M175 Other unilateral secondary osteoarthritis of knee: Secondary | ICD-10-CM | POA: Diagnosis not present

## 2012-04-03 DIAGNOSIS — M175 Other unilateral secondary osteoarthritis of knee: Secondary | ICD-10-CM | POA: Diagnosis not present

## 2012-04-05 DIAGNOSIS — M175 Other unilateral secondary osteoarthritis of knee: Secondary | ICD-10-CM | POA: Diagnosis not present

## 2012-04-08 DIAGNOSIS — M175 Other unilateral secondary osteoarthritis of knee: Secondary | ICD-10-CM | POA: Diagnosis not present

## 2012-04-10 DIAGNOSIS — M175 Other unilateral secondary osteoarthritis of knee: Secondary | ICD-10-CM | POA: Diagnosis not present

## 2012-04-12 DIAGNOSIS — M175 Other unilateral secondary osteoarthritis of knee: Secondary | ICD-10-CM | POA: Diagnosis not present

## 2012-04-15 DIAGNOSIS — M175 Other unilateral secondary osteoarthritis of knee: Secondary | ICD-10-CM | POA: Diagnosis not present

## 2012-04-18 DIAGNOSIS — M175 Other unilateral secondary osteoarthritis of knee: Secondary | ICD-10-CM | POA: Diagnosis not present

## 2012-04-22 DIAGNOSIS — M175 Other unilateral secondary osteoarthritis of knee: Secondary | ICD-10-CM | POA: Diagnosis not present

## 2012-04-26 DIAGNOSIS — M175 Other unilateral secondary osteoarthritis of knee: Secondary | ICD-10-CM | POA: Diagnosis not present

## 2012-05-03 DIAGNOSIS — M175 Other unilateral secondary osteoarthritis of knee: Secondary | ICD-10-CM | POA: Diagnosis not present

## 2012-05-06 DIAGNOSIS — M175 Other unilateral secondary osteoarthritis of knee: Secondary | ICD-10-CM | POA: Diagnosis not present

## 2012-05-08 DIAGNOSIS — M175 Other unilateral secondary osteoarthritis of knee: Secondary | ICD-10-CM | POA: Diagnosis not present

## 2012-05-13 DIAGNOSIS — M175 Other unilateral secondary osteoarthritis of knee: Secondary | ICD-10-CM | POA: Diagnosis not present

## 2012-05-29 DIAGNOSIS — M175 Other unilateral secondary osteoarthritis of knee: Secondary | ICD-10-CM | POA: Diagnosis not present

## 2012-06-03 DIAGNOSIS — E2749 Other adrenocortical insufficiency: Secondary | ICD-10-CM | POA: Diagnosis not present

## 2012-06-03 DIAGNOSIS — M069 Rheumatoid arthritis, unspecified: Secondary | ICD-10-CM | POA: Diagnosis not present

## 2012-06-03 DIAGNOSIS — M159 Polyosteoarthritis, unspecified: Secondary | ICD-10-CM | POA: Diagnosis not present

## 2012-06-04 DIAGNOSIS — E785 Hyperlipidemia, unspecified: Secondary | ICD-10-CM | POA: Diagnosis not present

## 2012-06-04 DIAGNOSIS — Z125 Encounter for screening for malignant neoplasm of prostate: Secondary | ICD-10-CM | POA: Diagnosis not present

## 2012-06-04 DIAGNOSIS — I10 Essential (primary) hypertension: Secondary | ICD-10-CM | POA: Diagnosis not present

## 2012-06-04 DIAGNOSIS — M810 Age-related osteoporosis without current pathological fracture: Secondary | ICD-10-CM | POA: Diagnosis not present

## 2012-06-11 DIAGNOSIS — Z79899 Other long term (current) drug therapy: Secondary | ICD-10-CM | POA: Diagnosis not present

## 2012-06-11 DIAGNOSIS — D473 Essential (hemorrhagic) thrombocythemia: Secondary | ICD-10-CM | POA: Diagnosis not present

## 2012-06-11 DIAGNOSIS — Z23 Encounter for immunization: Secondary | ICD-10-CM | POA: Diagnosis not present

## 2012-06-11 DIAGNOSIS — Z0000 Encounter for general adult medical examination without abnormal findings: Secondary | ICD-10-CM | POA: Diagnosis not present

## 2012-06-11 DIAGNOSIS — Z125 Encounter for screening for malignant neoplasm of prostate: Secondary | ICD-10-CM | POA: Diagnosis not present

## 2012-06-14 DIAGNOSIS — Z1212 Encounter for screening for malignant neoplasm of rectum: Secondary | ICD-10-CM | POA: Diagnosis not present

## 2012-07-17 DIAGNOSIS — Z87898 Personal history of other specified conditions: Secondary | ICD-10-CM | POA: Diagnosis not present

## 2012-07-17 DIAGNOSIS — M549 Dorsalgia, unspecified: Secondary | ICD-10-CM | POA: Diagnosis not present

## 2012-07-17 DIAGNOSIS — K5732 Diverticulitis of large intestine without perforation or abscess without bleeding: Secondary | ICD-10-CM | POA: Diagnosis not present

## 2012-07-29 DIAGNOSIS — Z96659 Presence of unspecified artificial knee joint: Secondary | ICD-10-CM | POA: Diagnosis not present

## 2012-09-03 DIAGNOSIS — R05 Cough: Secondary | ICD-10-CM | POA: Diagnosis not present

## 2012-09-03 DIAGNOSIS — I10 Essential (primary) hypertension: Secondary | ICD-10-CM | POA: Diagnosis not present

## 2012-09-03 DIAGNOSIS — J309 Allergic rhinitis, unspecified: Secondary | ICD-10-CM | POA: Diagnosis not present

## 2012-09-03 DIAGNOSIS — K5732 Diverticulitis of large intestine without perforation or abscess without bleeding: Secondary | ICD-10-CM | POA: Diagnosis not present

## 2012-09-03 DIAGNOSIS — M069 Rheumatoid arthritis, unspecified: Secondary | ICD-10-CM | POA: Diagnosis not present

## 2012-09-03 DIAGNOSIS — R059 Cough, unspecified: Secondary | ICD-10-CM | POA: Diagnosis not present

## 2012-09-03 DIAGNOSIS — J0100 Acute maxillary sinusitis, unspecified: Secondary | ICD-10-CM | POA: Diagnosis not present

## 2012-09-17 DIAGNOSIS — D126 Benign neoplasm of colon, unspecified: Secondary | ICD-10-CM | POA: Diagnosis not present

## 2012-09-17 DIAGNOSIS — Z86010 Personal history of colonic polyps: Secondary | ICD-10-CM | POA: Diagnosis not present

## 2012-09-17 DIAGNOSIS — Z09 Encounter for follow-up examination after completed treatment for conditions other than malignant neoplasm: Secondary | ICD-10-CM | POA: Diagnosis not present

## 2012-09-17 DIAGNOSIS — Z800 Family history of malignant neoplasm of digestive organs: Secondary | ICD-10-CM | POA: Diagnosis not present

## 2012-10-30 DIAGNOSIS — Z961 Presence of intraocular lens: Secondary | ICD-10-CM | POA: Diagnosis not present

## 2012-10-30 DIAGNOSIS — H52209 Unspecified astigmatism, unspecified eye: Secondary | ICD-10-CM | POA: Diagnosis not present

## 2012-11-21 DIAGNOSIS — K5732 Diverticulitis of large intestine without perforation or abscess without bleeding: Secondary | ICD-10-CM | POA: Diagnosis not present

## 2012-11-21 DIAGNOSIS — R509 Fever, unspecified: Secondary | ICD-10-CM | POA: Diagnosis not present

## 2012-11-21 DIAGNOSIS — M069 Rheumatoid arthritis, unspecified: Secondary | ICD-10-CM | POA: Diagnosis not present

## 2012-11-21 DIAGNOSIS — I10 Essential (primary) hypertension: Secondary | ICD-10-CM | POA: Diagnosis not present

## 2012-12-03 DIAGNOSIS — E2749 Other adrenocortical insufficiency: Secondary | ICD-10-CM | POA: Diagnosis not present

## 2012-12-03 DIAGNOSIS — M069 Rheumatoid arthritis, unspecified: Secondary | ICD-10-CM | POA: Diagnosis not present

## 2012-12-03 DIAGNOSIS — M159 Polyosteoarthritis, unspecified: Secondary | ICD-10-CM | POA: Diagnosis not present

## 2012-12-17 DIAGNOSIS — Z23 Encounter for immunization: Secondary | ICD-10-CM | POA: Diagnosis not present

## 2012-12-17 DIAGNOSIS — K219 Gastro-esophageal reflux disease without esophagitis: Secondary | ICD-10-CM | POA: Diagnosis not present

## 2012-12-17 DIAGNOSIS — K5732 Diverticulitis of large intestine without perforation or abscess without bleeding: Secondary | ICD-10-CM | POA: Diagnosis not present

## 2012-12-17 DIAGNOSIS — M069 Rheumatoid arthritis, unspecified: Secondary | ICD-10-CM | POA: Diagnosis not present

## 2012-12-17 DIAGNOSIS — IMO0002 Reserved for concepts with insufficient information to code with codable children: Secondary | ICD-10-CM | POA: Diagnosis not present

## 2012-12-17 DIAGNOSIS — I10 Essential (primary) hypertension: Secondary | ICD-10-CM | POA: Diagnosis not present

## 2012-12-17 DIAGNOSIS — Z87898 Personal history of other specified conditions: Secondary | ICD-10-CM | POA: Diagnosis not present

## 2013-01-28 DIAGNOSIS — Z96659 Presence of unspecified artificial knee joint: Secondary | ICD-10-CM | POA: Diagnosis not present

## 2013-01-28 DIAGNOSIS — Z471 Aftercare following joint replacement surgery: Secondary | ICD-10-CM | POA: Diagnosis not present

## 2013-05-13 DIAGNOSIS — Z79899 Other long term (current) drug therapy: Secondary | ICD-10-CM | POA: Diagnosis not present

## 2013-06-05 DIAGNOSIS — E2749 Other adrenocortical insufficiency: Secondary | ICD-10-CM | POA: Diagnosis not present

## 2013-06-05 DIAGNOSIS — M069 Rheumatoid arthritis, unspecified: Secondary | ICD-10-CM | POA: Diagnosis not present

## 2013-06-05 DIAGNOSIS — M159 Polyosteoarthritis, unspecified: Secondary | ICD-10-CM | POA: Diagnosis not present

## 2013-06-05 DIAGNOSIS — Z13820 Encounter for screening for osteoporosis: Secondary | ICD-10-CM | POA: Diagnosis not present

## 2013-07-09 DIAGNOSIS — Z79899 Other long term (current) drug therapy: Secondary | ICD-10-CM | POA: Diagnosis not present

## 2013-07-09 DIAGNOSIS — H2640 Unspecified secondary cataract: Secondary | ICD-10-CM | POA: Diagnosis not present

## 2013-07-17 DIAGNOSIS — H2640 Unspecified secondary cataract: Secondary | ICD-10-CM | POA: Diagnosis not present

## 2013-07-17 DIAGNOSIS — H26499 Other secondary cataract, unspecified eye: Secondary | ICD-10-CM | POA: Diagnosis not present

## 2013-07-23 DIAGNOSIS — M25579 Pain in unspecified ankle and joints of unspecified foot: Secondary | ICD-10-CM | POA: Diagnosis not present

## 2013-08-15 DIAGNOSIS — I10 Essential (primary) hypertension: Secondary | ICD-10-CM | POA: Diagnosis not present

## 2013-08-15 DIAGNOSIS — M810 Age-related osteoporosis without current pathological fracture: Secondary | ICD-10-CM | POA: Diagnosis not present

## 2013-08-15 DIAGNOSIS — E785 Hyperlipidemia, unspecified: Secondary | ICD-10-CM | POA: Diagnosis not present

## 2013-08-15 DIAGNOSIS — Z125 Encounter for screening for malignant neoplasm of prostate: Secondary | ICD-10-CM | POA: Diagnosis not present

## 2013-08-15 DIAGNOSIS — I4891 Unspecified atrial fibrillation: Secondary | ICD-10-CM | POA: Diagnosis not present

## 2013-08-20 DIAGNOSIS — Z79899 Other long term (current) drug therapy: Secondary | ICD-10-CM | POA: Diagnosis not present

## 2013-08-22 DIAGNOSIS — E785 Hyperlipidemia, unspecified: Secondary | ICD-10-CM | POA: Diagnosis not present

## 2013-08-22 DIAGNOSIS — Z0000 Encounter for general adult medical examination without abnormal findings: Secondary | ICD-10-CM | POA: Diagnosis not present

## 2013-08-22 DIAGNOSIS — K5732 Diverticulitis of large intestine without perforation or abscess without bleeding: Secondary | ICD-10-CM | POA: Diagnosis not present

## 2013-08-22 DIAGNOSIS — F329 Major depressive disorder, single episode, unspecified: Secondary | ICD-10-CM | POA: Diagnosis not present

## 2013-08-22 DIAGNOSIS — I4891 Unspecified atrial fibrillation: Secondary | ICD-10-CM | POA: Diagnosis not present

## 2013-08-22 DIAGNOSIS — Z125 Encounter for screening for malignant neoplasm of prostate: Secondary | ICD-10-CM | POA: Diagnosis not present

## 2013-08-22 DIAGNOSIS — F3289 Other specified depressive episodes: Secondary | ICD-10-CM | POA: Diagnosis not present

## 2013-08-22 DIAGNOSIS — J309 Allergic rhinitis, unspecified: Secondary | ICD-10-CM | POA: Diagnosis not present

## 2013-08-22 DIAGNOSIS — Z1331 Encounter for screening for depression: Secondary | ICD-10-CM | POA: Diagnosis not present

## 2013-08-22 DIAGNOSIS — I10 Essential (primary) hypertension: Secondary | ICD-10-CM | POA: Diagnosis not present

## 2013-08-22 DIAGNOSIS — M069 Rheumatoid arthritis, unspecified: Secondary | ICD-10-CM | POA: Diagnosis not present

## 2013-09-02 DIAGNOSIS — Z1212 Encounter for screening for malignant neoplasm of rectum: Secondary | ICD-10-CM | POA: Diagnosis not present

## 2013-09-11 DIAGNOSIS — Z13820 Encounter for screening for osteoporosis: Secondary | ICD-10-CM | POA: Diagnosis not present

## 2013-09-11 DIAGNOSIS — M25569 Pain in unspecified knee: Secondary | ICD-10-CM | POA: Diagnosis not present

## 2013-09-11 DIAGNOSIS — Z96659 Presence of unspecified artificial knee joint: Secondary | ICD-10-CM | POA: Diagnosis not present

## 2013-09-11 DIAGNOSIS — Z96649 Presence of unspecified artificial hip joint: Secondary | ICD-10-CM | POA: Diagnosis not present

## 2013-09-11 DIAGNOSIS — M159 Polyosteoarthritis, unspecified: Secondary | ICD-10-CM | POA: Diagnosis not present

## 2013-09-11 DIAGNOSIS — Z471 Aftercare following joint replacement surgery: Secondary | ICD-10-CM | POA: Diagnosis not present

## 2013-09-11 DIAGNOSIS — M069 Rheumatoid arthritis, unspecified: Secondary | ICD-10-CM | POA: Diagnosis not present

## 2013-09-11 DIAGNOSIS — E2749 Other adrenocortical insufficiency: Secondary | ICD-10-CM | POA: Diagnosis not present

## 2013-09-16 DIAGNOSIS — M810 Age-related osteoporosis without current pathological fracture: Secondary | ICD-10-CM | POA: Diagnosis not present

## 2013-10-03 DIAGNOSIS — M2420 Disorder of ligament, unspecified site: Secondary | ICD-10-CM | POA: Diagnosis not present

## 2013-10-03 DIAGNOSIS — M629 Disorder of muscle, unspecified: Secondary | ICD-10-CM | POA: Diagnosis not present

## 2013-10-07 DIAGNOSIS — Z79899 Other long term (current) drug therapy: Secondary | ICD-10-CM | POA: Diagnosis not present

## 2013-10-07 DIAGNOSIS — M810 Age-related osteoporosis without current pathological fracture: Secondary | ICD-10-CM | POA: Diagnosis not present

## 2013-11-04 ENCOUNTER — Other Ambulatory Visit (HOSPITAL_COMMUNITY): Payer: Self-pay | Admitting: Internal Medicine

## 2013-11-04 ENCOUNTER — Encounter (HOSPITAL_COMMUNITY): Payer: Self-pay

## 2013-11-04 ENCOUNTER — Ambulatory Visit (HOSPITAL_COMMUNITY)
Admission: RE | Admit: 2013-11-04 | Discharge: 2013-11-04 | Disposition: A | Discharge status: Home / Self Care | Payer: Medicare Other | Source: Ambulatory Visit | Attending: Internal Medicine | Admitting: Internal Medicine

## 2013-11-04 DIAGNOSIS — M810 Age-related osteoporosis without current pathological fracture: Secondary | ICD-10-CM | POA: Insufficient documentation

## 2013-11-04 MED ORDER — SODIUM CHLORIDE 0.9 % IV SOLN
INTRAVENOUS | Status: DC
  Administered 2013-11-04: 250 mL via INTRAVENOUS

## 2013-11-04 MED ORDER — ZOLEDRONIC ACID 5 MG/100ML IV SOLN
5.0000 mg | Freq: Once | INTRAVENOUS | Status: AC
  Administered 2013-11-04: 5 mg via INTRAVENOUS
  Filled 2013-11-04: qty 100

## 2013-11-04 NOTE — Discharge Instructions (Signed)
RECLAST °Zoledronic Acid injection (Paget's Disease, Osteoporosis) °What is this medicine? °ZOLEDRONIC ACID (ZOE le dron ik AS id) lowers the amount of calcium loss from bone. It is used to treat Paget's disease and osteoporosis in women. °This medicine may be used for other purposes; ask your health care provider or pharmacist if you have questions. °COMMON BRAND NAME(S): Reclast, Zometa °What should I tell my health care provider before I take this medicine? °They need to know if you have any of these conditions: °-aspirin-sensitive asthma °-cancer, especially if you are receiving medicines used to treat cancer °-dental disease or wear dentures °-infection °-kidney disease °-low levels of calcium in the blood °-past surgery on the parathyroid gland or intestines °-receiving corticosteroids like dexamethasone or prednisone °-an unusual or allergic reaction to zoledronic acid, other medicines, foods, dyes, or preservatives °-pregnant or trying to get pregnant °-breast-feeding °How should I use this medicine? °This medicine is for infusion into a vein. It is given by a health care professional in a hospital or clinic setting. °Talk to your pediatrician regarding the use of this medicine in children. This medicine is not approved for use in children. °Overdosage: If you think you have taken too much of this medicine contact a poison control center or emergency room at once. °NOTE: This medicine is only for you. Do not share this medicine with others. °What if I miss a dose? °It is important not to miss your dose. Call your doctor or health care professional if you are unable to keep an appointment. °What may interact with this medicine? °-certain antibiotics given by injection °-NSAIDs, medicines for pain and inflammation, like ibuprofen or naproxen °-some diuretics like bumetanide, furosemide °-teriparatide °This list may not describe all possible interactions. Give your health care provider a list of all the  medicines, herbs, non-prescription drugs, or dietary supplements you use. Also tell them if you smoke, drink alcohol, or use illegal drugs. Some items may interact with your medicine. °What should I watch for while using this medicine? °Visit your doctor or health care professional for regular checkups. It may be some time before you see the benefit from this medicine. Do not stop taking your medicine unless your doctor tells you to. Your doctor may order blood tests or other tests to see how you are doing. °Women should inform their doctor if they wish to become pregnant or think they might be pregnant. There is a potential for serious side effects to an unborn child. Talk to your health care professional or pharmacist for more information. °You should make sure that you get enough calcium and vitamin D while you are taking this medicine. Discuss the foods you eat and the vitamins you take with your health care professional. °Some people who take this medicine have severe bone, joint, and/or muscle pain. This medicine may also increase your risk for jaw problems or a broken thigh bone. Tell your doctor right away if you have severe pain in your jaw, bones, joints, or muscles. Tell your doctor if you have any pain that does not go away or that gets worse. °Tell your dentist and dental surgeon that you are taking this medicine. You should not have major dental surgery while on this medicine. See your dentist to have a dental exam and fix any dental problems before starting this medicine. Take good care of your teeth while on this medicine. Make sure you see your dentist for regular follow-up appointments. °What side effects may I notice from receiving this   medicine? °Side effects that you should report to your doctor or health care professional as soon as possible: °-allergic reactions like skin rash, itching or hives, swelling of the face, lips, or tongue °-anxiety, confusion, or depression °-breathing  problems °-changes in vision °-eye pain °-feeling faint or lightheaded, falls °-jaw pain, especially after dental work °-mouth sores °-muscle cramps, stiffness, or weakness °-trouble passing urine or change in the amount of urine °Side effects that usually do not require medical attention (report to your doctor or health care professional if they continue or are bothersome): °-bone, joint, or muscle pain °-constipation °-diarrhea °-fever °-hair loss °-irritation at site where injected °-loss of appetite °-nausea, vomiting °-stomach upset °-trouble sleeping °-trouble swallowing °-weak or tired °This list may not describe all possible side effects. Call your doctor for medical advice about side effects. You may report side effects to FDA at 1-800-FDA-1088. °Where should I keep my medicine? °This drug is given in a hospital or clinic and will not be stored at home. °NOTE: This sheet is a summary. It may not cover all possible information. If you have questions about this medicine, talk to your doctor, pharmacist, or health care provider. °© 2015, [employer]. (2012-09-23 10:03:48) °Osteoporosis °Throughout your life, your body breaks down old bone and replaces it with new bone. As you get older, your body does not replace bone as quickly as it breaks it down. By the age of 30 years, most people begin to gradually lose bone because of the imbalance between bone loss and replacement. Some people lose more bone than others. Bone loss beyond a specified normal degree is considered osteoporosis.  °Osteoporosis affects the strength and durability of your bones. The inside of the ends of your bones and your flat bones, like the bones of your pelvis, look like honeycomb, filled with tiny open spaces. As bone loss occurs, your bones become less dense. This means that the open spaces inside your bones become bigger and the walls between these spaces become thinner. This makes your bones weaker. Bones of a person with  osteoporosis can become so weak that they can break (fracture) during minor accidents, such as a simple fall. °CAUSES  °The following factors have been associated with the development of osteoporosis: °· Smoking. °· Drinking more than 2 alcoholic drinks several days per week. °· Long-term use of certain medicines: °¨ Corticosteroids. °¨ Chemotherapy medicines. °¨ Thyroid medicines. °¨ Antiepileptic medicines. °¨ Gonadal hormone suppression medicine. °¨ Immunosuppression medicine. °· Being underweight. °· Lack of physical activity. °· Lack of exposure to the sun. This can lead to vitamin D deficiency. °· Certain medical conditions: °¨ Certain inflammatory bowel diseases, such as Crohn disease and ulcerative colitis. °¨ Diabetes. °¨ Hyperthyroidism. °¨ Hyperparathyroidism. °RISK FACTORS °Anyone can develop osteoporosis. However, the following factors can increase your risk of developing osteoporosis: °· Gender--Women are at higher risk than men. °· Age--Being older than 50 years increases your risk. °· Ethnicity--White and Asian people have an increased risk. °· Weight --Being extremely underweight can increase your risk of osteoporosis. °· Family history of osteoporosis--Having a family member who has developed osteoporosis can increase your risk. °SYMPTOMS  °Usually, people with osteoporosis have no symptoms.  °DIAGNOSIS  °Signs during a physical exam that may prompt your caregiver to suspect osteoporosis include: °· Decreased height. This is usually caused by the compression of the bones that form your spine (vertebrae) because they have weakened and become fractured. °· A curving or rounding of the upper back (kyphosis). °  To confirm signs of osteoporosis, your caregiver may request a procedure that uses 2 low-dose X-ray beams with different levels of energy to measure your bone mineral density (dual-energy X-ray absorptiometry [DXA]). Also, your caregiver may check your level of vitamin D. °TREATMENT  °The goal of  osteoporosis treatment is to strengthen bones in order to decrease the risk of bone fractures. There are different types of medicines available to help achieve this goal. Some of these medicines work by slowing the processes of bone loss. Some medicines work by increasing bone density. Treatment also involves making sure that your levels of calcium and vitamin D are adequate. °PREVENTION  °There are things you can do to help prevent osteoporosis. Adequate intake of calcium and vitamin D can help you achieve optimal bone mineral density. Regular exercise can also help, especially resistance and weight-bearing activities. If you smoke, quitting smoking is an important part of osteoporosis prevention. °MAKE SURE YOU: °· Understand these instructions. °· Will watch your condition. °· Will get help right away if you are not doing well or get worse. °FOR MORE INFORMATION °www.osteo.org and www.nof.org °Document Released: 01/18/2005 Document Revised: 08/05/2012 Document Reviewed: 03/25/2011 °ExitCare® Patient Information ©2015 [employer]. This information is not intended to replace advice given to you by your health care provider. Make sure you discuss any questions you have with your health care provider. ° ° °

## 2013-12-04 DIAGNOSIS — E2749 Other adrenocortical insufficiency: Secondary | ICD-10-CM | POA: Diagnosis not present

## 2013-12-04 DIAGNOSIS — Z13820 Encounter for screening for osteoporosis: Secondary | ICD-10-CM | POA: Diagnosis not present

## 2013-12-04 DIAGNOSIS — M069 Rheumatoid arthritis, unspecified: Secondary | ICD-10-CM | POA: Diagnosis not present

## 2013-12-04 DIAGNOSIS — M159 Polyosteoarthritis, unspecified: Secondary | ICD-10-CM | POA: Diagnosis not present

## 2014-01-14 DIAGNOSIS — M629 Disorder of muscle, unspecified: Secondary | ICD-10-CM | POA: Diagnosis not present

## 2014-01-14 DIAGNOSIS — M2420 Disorder of ligament, unspecified site: Secondary | ICD-10-CM | POA: Diagnosis not present

## 2014-01-29 DIAGNOSIS — Z23 Encounter for immunization: Secondary | ICD-10-CM | POA: Diagnosis not present

## 2014-02-16 DIAGNOSIS — Z961 Presence of intraocular lens: Secondary | ICD-10-CM | POA: Diagnosis not present

## 2014-02-16 DIAGNOSIS — Z79899 Other long term (current) drug therapy: Secondary | ICD-10-CM | POA: Diagnosis not present

## 2014-02-18 DIAGNOSIS — Z6821 Body mass index (BMI) 21.0-21.9, adult: Secondary | ICD-10-CM | POA: Diagnosis not present

## 2014-02-18 DIAGNOSIS — Z8719 Personal history of other diseases of the digestive system: Secondary | ICD-10-CM | POA: Diagnosis not present

## 2014-02-18 DIAGNOSIS — M069 Rheumatoid arthritis, unspecified: Secondary | ICD-10-CM | POA: Diagnosis not present

## 2014-02-18 DIAGNOSIS — I10 Essential (primary) hypertension: Secondary | ICD-10-CM | POA: Diagnosis not present

## 2014-02-18 DIAGNOSIS — M810 Age-related osteoporosis without current pathological fracture: Secondary | ICD-10-CM | POA: Diagnosis not present

## 2014-02-27 DIAGNOSIS — L718 Other rosacea: Secondary | ICD-10-CM | POA: Diagnosis not present

## 2014-02-27 DIAGNOSIS — L821 Other seborrheic keratosis: Secondary | ICD-10-CM | POA: Diagnosis not present

## 2014-02-27 DIAGNOSIS — L820 Inflamed seborrheic keratosis: Secondary | ICD-10-CM | POA: Diagnosis not present

## 2014-02-27 DIAGNOSIS — D485 Neoplasm of uncertain behavior of skin: Secondary | ICD-10-CM | POA: Diagnosis not present

## 2014-04-09 DIAGNOSIS — Z79899 Other long term (current) drug therapy: Secondary | ICD-10-CM | POA: Diagnosis not present

## 2014-04-09 DIAGNOSIS — Z6841 Body Mass Index (BMI) 40.0 and over, adult: Secondary | ICD-10-CM | POA: Diagnosis not present

## 2014-04-09 DIAGNOSIS — M810 Age-related osteoporosis without current pathological fracture: Secondary | ICD-10-CM | POA: Diagnosis not present

## 2014-04-27 ENCOUNTER — Ambulatory Visit (HOSPITAL_COMMUNITY): Payer: Medicare Other

## 2014-06-04 DIAGNOSIS — M150 Primary generalized (osteo)arthritis: Secondary | ICD-10-CM | POA: Diagnosis not present

## 2014-06-04 DIAGNOSIS — Z7952 Long term (current) use of systemic steroids: Secondary | ICD-10-CM | POA: Diagnosis not present

## 2014-06-04 DIAGNOSIS — M0589 Other rheumatoid arthritis with rheumatoid factor of multiple sites: Secondary | ICD-10-CM | POA: Diagnosis not present

## 2014-06-04 DIAGNOSIS — E2749 Other adrenocortical insufficiency: Secondary | ICD-10-CM | POA: Diagnosis not present

## 2014-07-22 DIAGNOSIS — M7632 Iliotibial band syndrome, left leg: Secondary | ICD-10-CM | POA: Diagnosis not present

## 2014-08-08 DIAGNOSIS — R262 Difficulty in walking, not elsewhere classified: Secondary | ICD-10-CM | POA: Diagnosis not present

## 2014-08-08 DIAGNOSIS — M25662 Stiffness of left knee, not elsewhere classified: Secondary | ICD-10-CM | POA: Diagnosis not present

## 2014-08-11 DIAGNOSIS — M25562 Pain in left knee: Secondary | ICD-10-CM | POA: Diagnosis not present

## 2014-08-11 DIAGNOSIS — M25662 Stiffness of left knee, not elsewhere classified: Secondary | ICD-10-CM | POA: Diagnosis not present

## 2014-08-11 DIAGNOSIS — R262 Difficulty in walking, not elsewhere classified: Secondary | ICD-10-CM | POA: Diagnosis not present

## 2014-08-19 DIAGNOSIS — M25562 Pain in left knee: Secondary | ICD-10-CM | POA: Diagnosis not present

## 2014-08-19 DIAGNOSIS — M25662 Stiffness of left knee, not elsewhere classified: Secondary | ICD-10-CM | POA: Diagnosis not present

## 2014-08-19 DIAGNOSIS — R262 Difficulty in walking, not elsewhere classified: Secondary | ICD-10-CM | POA: Diagnosis not present

## 2014-08-28 DIAGNOSIS — I10 Essential (primary) hypertension: Secondary | ICD-10-CM | POA: Diagnosis not present

## 2014-08-28 DIAGNOSIS — M810 Age-related osteoporosis without current pathological fracture: Secondary | ICD-10-CM | POA: Diagnosis not present

## 2014-08-28 DIAGNOSIS — E785 Hyperlipidemia, unspecified: Secondary | ICD-10-CM | POA: Diagnosis not present

## 2014-08-28 DIAGNOSIS — Z125 Encounter for screening for malignant neoplasm of prostate: Secondary | ICD-10-CM | POA: Diagnosis not present

## 2014-09-04 DIAGNOSIS — K219 Gastro-esophageal reflux disease without esophagitis: Secondary | ICD-10-CM | POA: Diagnosis not present

## 2014-09-04 DIAGNOSIS — F329 Major depressive disorder, single episode, unspecified: Secondary | ICD-10-CM | POA: Diagnosis not present

## 2014-09-04 DIAGNOSIS — Z6820 Body mass index (BMI) 20.0-20.9, adult: Secondary | ICD-10-CM | POA: Diagnosis not present

## 2014-09-04 DIAGNOSIS — D473 Essential (hemorrhagic) thrombocythemia: Secondary | ICD-10-CM | POA: Diagnosis not present

## 2014-09-04 DIAGNOSIS — Z0000 Encounter for general adult medical examination without abnormal findings: Secondary | ICD-10-CM | POA: Diagnosis not present

## 2014-09-04 DIAGNOSIS — E785 Hyperlipidemia, unspecified: Secondary | ICD-10-CM | POA: Diagnosis not present

## 2014-09-04 DIAGNOSIS — I10 Essential (primary) hypertension: Secondary | ICD-10-CM | POA: Diagnosis not present

## 2014-09-04 DIAGNOSIS — Z1389 Encounter for screening for other disorder: Secondary | ICD-10-CM | POA: Diagnosis not present

## 2014-09-04 DIAGNOSIS — D509 Iron deficiency anemia, unspecified: Secondary | ICD-10-CM | POA: Diagnosis not present

## 2014-09-04 DIAGNOSIS — M069 Rheumatoid arthritis, unspecified: Secondary | ICD-10-CM | POA: Diagnosis not present

## 2014-09-04 DIAGNOSIS — D126 Benign neoplasm of colon, unspecified: Secondary | ICD-10-CM | POA: Diagnosis not present

## 2014-09-04 DIAGNOSIS — L84 Corns and callosities: Secondary | ICD-10-CM | POA: Diagnosis not present

## 2014-09-14 DIAGNOSIS — Z1212 Encounter for screening for malignant neoplasm of rectum: Secondary | ICD-10-CM | POA: Diagnosis not present

## 2014-10-15 ENCOUNTER — Ambulatory Visit: Payer: Medicare Other | Admitting: Podiatry

## 2014-10-17 DIAGNOSIS — Z23 Encounter for immunization: Secondary | ICD-10-CM | POA: Diagnosis not present

## 2014-10-21 ENCOUNTER — Ambulatory Visit: Payer: Medicare Other | Admitting: Podiatry

## 2014-10-22 ENCOUNTER — Ambulatory Visit (INDEPENDENT_AMBULATORY_CARE_PROVIDER_SITE_OTHER): Payer: Medicare Other | Admitting: Podiatry

## 2014-10-22 ENCOUNTER — Ambulatory Visit: Payer: Medicare Other

## 2014-10-22 ENCOUNTER — Encounter: Payer: Self-pay | Admitting: Podiatry

## 2014-10-22 VITALS — BP 132/74 | HR 74 | Resp 15

## 2014-10-22 DIAGNOSIS — Q667 Congenital pes cavus: Secondary | ICD-10-CM | POA: Diagnosis not present

## 2014-10-22 DIAGNOSIS — M779 Enthesopathy, unspecified: Secondary | ICD-10-CM

## 2014-10-22 DIAGNOSIS — L84 Corns and callosities: Secondary | ICD-10-CM

## 2014-10-22 DIAGNOSIS — M216X9 Other acquired deformities of unspecified foot: Secondary | ICD-10-CM

## 2014-10-22 MED ORDER — TRIAMCINOLONE ACETONIDE 10 MG/ML IJ SUSP
10.0000 mg | Freq: Once | INTRAMUSCULAR | Status: AC
Start: 2014-10-22 — End: 2014-10-22
  Administered 2014-10-22: 10 mg

## 2014-10-22 NOTE — Progress Notes (Signed)
Subjective:     Patient ID: John Lyons, male   DOB: [birth year], 69 y.o.   MRN: [id number]  HPI patient presents with pain in the fifth metatarsal head of both feet and pain in the dorsal lateral aspect of the right forefoot with inflammation noted. Does not remember specific injury but does have rheumatoid arthritis   Review of Systems  All other systems reviewed and are negative.      Objective:   Physical Exam  Constitutional: He is oriented to person, place, and time.  Cardiovascular: Intact distal pulses.   Musculoskeletal: Normal range of motion.  Neurological: He is oriented to person, place, and time.  Skin: Skin is warm.  Nursing note and vitals reviewed.  neurovascular status intact muscle strength adequate range of motion within normal limits. Patient's noted to have discomfort in the fifth metatarsal head bilateral right over left with keratotic tissue formation and inflammation and fluid on the dorsal lateral aspect of the right foot that's tender when pressed. Does not remember specific injury pattern and is found to have good digital perfusion is well oriented 3     Assessment:     Tendinitis dorsal lateral aspect right foot along with keratotic lesion secondary to rheumatoid arthritis with lesion formation    Plan:     H&P and x-rays reviewed with patient and today injected the dorsal lateral foot 3 mg Kenalog 5 mg Xylocaine and debrided the lesions bilateral with no iatrogenic bleeding noted. Reappoint when symptomatic

## 2014-10-22 NOTE — Progress Notes (Signed)
   Subjective:    Patient ID: John Lyons, male    DOB: 11/21/1945, 69 y.o.   MRN: [id number]  HPI  Pt presents with painful knot on the lateral side of his right foot. He also c/o a painful callus on plantar aspect of his right foot.  Review of Systems  All other systems reviewed and are negative.      Objective:   Physical Exam        Assessment & Plan:

## 2014-12-02 DIAGNOSIS — M810 Age-related osteoporosis without current pathological fracture: Secondary | ICD-10-CM | POA: Diagnosis not present

## 2014-12-02 DIAGNOSIS — E785 Hyperlipidemia, unspecified: Secondary | ICD-10-CM | POA: Diagnosis not present

## 2014-12-02 DIAGNOSIS — I10 Essential (primary) hypertension: Secondary | ICD-10-CM | POA: Diagnosis not present

## 2014-12-07 DIAGNOSIS — Z79899 Other long term (current) drug therapy: Secondary | ICD-10-CM | POA: Diagnosis not present

## 2014-12-07 DIAGNOSIS — M810 Age-related osteoporosis without current pathological fracture: Secondary | ICD-10-CM | POA: Diagnosis not present

## 2014-12-07 DIAGNOSIS — M0589 Other rheumatoid arthritis with rheumatoid factor of multiple sites: Secondary | ICD-10-CM | POA: Diagnosis not present

## 2014-12-07 DIAGNOSIS — M150 Primary generalized (osteo)arthritis: Secondary | ICD-10-CM | POA: Diagnosis not present

## 2014-12-07 DIAGNOSIS — M2550 Pain in unspecified joint: Secondary | ICD-10-CM | POA: Diagnosis not present

## 2014-12-29 ENCOUNTER — Encounter (HOSPITAL_COMMUNITY): Payer: Self-pay

## 2014-12-29 ENCOUNTER — Ambulatory Visit (HOSPITAL_COMMUNITY)
Admission: RE | Admit: 2014-12-29 | Discharge: 2014-12-29 | Disposition: A | Discharge status: Home / Self Care | Payer: Medicare Other | Source: Ambulatory Visit | Attending: Internal Medicine | Admitting: Internal Medicine

## 2014-12-29 DIAGNOSIS — M810 Age-related osteoporosis without current pathological fracture: Secondary | ICD-10-CM | POA: Insufficient documentation

## 2014-12-29 MED ORDER — ZOLEDRONIC ACID 5 MG/100ML IV SOLN
5.0000 mg | Freq: Once | INTRAVENOUS | Status: AC
  Administered 2014-12-29: 5 mg via INTRAVENOUS
  Filled 2014-12-29: qty 100

## 2014-12-29 MED ORDER — SODIUM CHLORIDE 0.9 % IV SOLN
250.0000 mL | INTRAVENOUS | Status: AC
  Administered 2014-12-29: 250 mL via INTRAVENOUS

## 2014-12-29 NOTE — Discharge Instructions (Signed)
Zoledronic Acid injection (Paget's Disease, Osteoporosis) °What is this medicine? °ZOLEDRONIC ACID (ZOE le dron ik AS id) lowers the amount of calcium loss from bone. It is used to treat Paget's disease and osteoporosis in women. °This medicine may be used for other purposes; ask your health care provider or pharmacist if you have questions. °COMMON BRAND NAME(S): Reclast, Zometa °What should I tell my health care provider before I take this medicine? °They need to know if you have any of these conditions: °-aspirin-sensitive asthma °-cancer, especially if you are receiving medicines used to treat cancer °-dental disease or wear dentures °-infection °-kidney disease °-low levels of calcium in the blood °-past surgery on the parathyroid gland or intestines °-receiving corticosteroids like dexamethasone or prednisone °-an unusual or allergic reaction to zoledronic acid, other medicines, foods, dyes, or preservatives °-pregnant or trying to get pregnant °-breast-feeding °How should I use this medicine? °This medicine is for infusion into a vein. It is given by a health care professional in a hospital or clinic setting. °Talk to your pediatrician regarding the use of this medicine in children. This medicine is not approved for use in children. °Overdosage: If you think you have taken too much of this medicine contact a poison control center or emergency room at once. °NOTE: This medicine is only for you. Do not share this medicine with others. °What if I miss a dose? °It is important not to miss your dose. Call your doctor or health care professional if you are unable to keep an appointment. °What may interact with this medicine? °-certain antibiotics given by injection °-NSAIDs, medicines for pain and inflammation, like ibuprofen or naproxen °-some diuretics like bumetanide, furosemide °-teriparatide °This list may not describe all possible interactions. Give your health care provider a list of all the medicines,  herbs, non-prescription drugs, or dietary supplements you use. Also tell them if you smoke, drink alcohol, or use illegal drugs. Some items may interact with your medicine. °What should I watch for while using this medicine? °Visit your doctor or health care professional for regular checkups. It may be some time before you see the benefit from this medicine. Do not stop taking your medicine unless your doctor tells you to. Your doctor may order blood tests or other tests to see how you are doing. °Women should inform their doctor if they wish to become pregnant or think they might be pregnant. There is a potential for serious side effects to an unborn child. Talk to your health care professional or pharmacist for more information. °You should make sure that you get enough calcium and vitamin D while you are taking this medicine. Discuss the foods you eat and the vitamins you take with your health care professional. °Some people who take this medicine have severe bone, joint, and/or muscle pain. This medicine may also increase your risk for jaw problems or a broken thigh bone. Tell your doctor right away if you have severe pain in your jaw, bones, joints, or muscles. Tell your doctor if you have any pain that does not go away or that gets worse. °Tell your dentist and dental surgeon that you are taking this medicine. You should not have major dental surgery while on this medicine. See your dentist to have a dental exam and fix any dental problems before starting this medicine. Take good care of your teeth while on this medicine. Make sure you see your dentist for regular follow-up appointments. °What side effects may I notice from receiving this medicine? °  Side effects that you should report to your doctor or health care professional as soon as possible: -allergic reactions like skin rash, itching or hives, swelling of the face, lips, or tongue -anxiety, confusion, or depression -breathing problems -changes in  vision -eye pain -feeling faint or lightheaded, falls -jaw pain, especially after dental work -mouth sores -muscle cramps, stiffness, or weakness -trouble passing urine or change in the amount of urine Side effects that usually do not require medical attention (report to your doctor or health care professional if they continue or are bothersome): -bone, joint, or muscle pain -constipation -diarrhea -fever -hair loss -irritation at site where injected -loss of appetite -nausea, vomiting -stomach upset -trouble sleeping -trouble swallowing -weak or tired This list may not describe all possible side effects. Call your doctor for medical advice about side effects. You may report side effects to FDA at 1-800-FDA-1088. Where should I keep my medicine? This drug is given in a hospital or clinic and will not be stored at home. NOTE: This sheet is a summary. It may not cover all possible information. If you have questions about this medicine, talk to your doctor, pharmacist, or health care provider.  2015, Elsevier/Gold Standard. (2012-09-23 10:03:48)  Drink fluids/water as tolerated over next 72hrs Tylenol or Ibuprofen OTC as directed Continue calcium and Vit D as directed by your MD

## 2014-12-31 ENCOUNTER — Ambulatory Visit (HOSPITAL_COMMUNITY): Payer: Medicare Other

## 2015-01-27 DIAGNOSIS — Z23 Encounter for immunization: Secondary | ICD-10-CM | POA: Diagnosis not present

## 2015-02-19 DIAGNOSIS — Z961 Presence of intraocular lens: Secondary | ICD-10-CM | POA: Diagnosis not present

## 2015-02-19 DIAGNOSIS — Z79899 Other long term (current) drug therapy: Secondary | ICD-10-CM | POA: Diagnosis not present

## 2015-02-19 DIAGNOSIS — H524 Presbyopia: Secondary | ICD-10-CM | POA: Diagnosis not present

## 2015-02-19 DIAGNOSIS — H5213 Myopia, bilateral: Secondary | ICD-10-CM | POA: Diagnosis not present

## 2015-03-09 DIAGNOSIS — M810 Age-related osteoporosis without current pathological fracture: Secondary | ICD-10-CM | POA: Diagnosis not present

## 2015-03-09 DIAGNOSIS — M150 Primary generalized (osteo)arthritis: Secondary | ICD-10-CM | POA: Diagnosis not present

## 2015-03-09 DIAGNOSIS — E271 Primary adrenocortical insufficiency: Secondary | ICD-10-CM | POA: Diagnosis not present

## 2015-03-09 DIAGNOSIS — M0589 Other rheumatoid arthritis with rheumatoid factor of multiple sites: Secondary | ICD-10-CM | POA: Diagnosis not present

## 2015-03-11 DIAGNOSIS — M069 Rheumatoid arthritis, unspecified: Secondary | ICD-10-CM | POA: Diagnosis not present

## 2015-03-11 DIAGNOSIS — Z6821 Body mass index (BMI) 21.0-21.9, adult: Secondary | ICD-10-CM | POA: Diagnosis not present

## 2015-03-11 DIAGNOSIS — I10 Essential (primary) hypertension: Secondary | ICD-10-CM | POA: Diagnosis not present

## 2015-03-11 DIAGNOSIS — M810 Age-related osteoporosis without current pathological fracture: Secondary | ICD-10-CM | POA: Diagnosis not present

## 2015-04-15 DIAGNOSIS — M47816 Spondylosis without myelopathy or radiculopathy, lumbar region: Secondary | ICD-10-CM | POA: Diagnosis not present

## 2015-04-15 DIAGNOSIS — Z6822 Body mass index (BMI) 22.0-22.9, adult: Secondary | ICD-10-CM | POA: Diagnosis not present

## 2015-04-15 DIAGNOSIS — M069 Rheumatoid arthritis, unspecified: Secondary | ICD-10-CM | POA: Diagnosis not present

## 2015-04-15 DIAGNOSIS — M79652 Pain in left thigh: Secondary | ICD-10-CM | POA: Diagnosis not present

## 2015-04-15 DIAGNOSIS — M25552 Pain in left hip: Secondary | ICD-10-CM | POA: Diagnosis not present

## 2015-04-15 DIAGNOSIS — T8452XA Infection and inflammatory reaction due to internal left hip prosthesis, initial encounter: Secondary | ICD-10-CM | POA: Diagnosis not present

## 2015-04-15 DIAGNOSIS — M7989 Other specified soft tissue disorders: Secondary | ICD-10-CM | POA: Diagnosis not present

## 2015-04-15 DIAGNOSIS — Z96642 Presence of left artificial hip joint: Secondary | ICD-10-CM | POA: Diagnosis not present

## 2015-04-15 DIAGNOSIS — M25862 Other specified joint disorders, left knee: Secondary | ICD-10-CM | POA: Diagnosis not present

## 2015-04-15 DIAGNOSIS — Z96652 Presence of left artificial knee joint: Secondary | ICD-10-CM | POA: Diagnosis not present

## 2015-04-15 DIAGNOSIS — R600 Localized edema: Secondary | ICD-10-CM | POA: Diagnosis not present

## 2015-04-15 DIAGNOSIS — M25562 Pain in left knee: Secondary | ICD-10-CM | POA: Diagnosis not present

## 2015-04-23 DIAGNOSIS — T8484XA Pain due to internal orthopedic prosthetic devices, implants and grafts, initial encounter: Secondary | ICD-10-CM | POA: Diagnosis not present

## 2015-04-23 DIAGNOSIS — T84018A Broken internal joint prosthesis, other site, initial encounter: Secondary | ICD-10-CM | POA: Diagnosis not present

## 2015-04-23 DIAGNOSIS — Z96642 Presence of left artificial hip joint: Secondary | ICD-10-CM | POA: Diagnosis not present

## 2015-04-27 ENCOUNTER — Other Ambulatory Visit: Payer: Self-pay | Admitting: Orthopedic Surgery

## 2015-04-27 DIAGNOSIS — T84018A Broken internal joint prosthesis, other site, initial encounter: Secondary | ICD-10-CM

## 2015-04-27 DIAGNOSIS — Z96649 Presence of unspecified artificial hip joint: Principal | ICD-10-CM

## 2015-04-28 DIAGNOSIS — R05 Cough: Secondary | ICD-10-CM | POA: Diagnosis not present

## 2015-04-28 DIAGNOSIS — D849 Immunodeficiency, unspecified: Secondary | ICD-10-CM | POA: Diagnosis not present

## 2015-04-28 DIAGNOSIS — Z6821 Body mass index (BMI) 21.0-21.9, adult: Secondary | ICD-10-CM | POA: Diagnosis not present

## 2015-05-04 ENCOUNTER — Other Ambulatory Visit: Payer: Medicare Other

## 2015-05-18 ENCOUNTER — Ambulatory Visit
Admission: RE | Admit: 2015-05-18 | Discharge: 2015-05-18 | Disposition: A | Discharge status: Home / Self Care | Payer: Medicare Other | Source: Ambulatory Visit | Attending: Orthopedic Surgery | Admitting: Orthopedic Surgery

## 2015-05-18 ENCOUNTER — Inpatient Hospital Stay: Admission: RE | Admit: 2015-05-18 | Payer: Medicare Other | Source: Ambulatory Visit

## 2015-05-18 DIAGNOSIS — M89552 Osteolysis, left thigh: Secondary | ICD-10-CM | POA: Diagnosis not present

## 2015-05-18 DIAGNOSIS — T84018A Broken internal joint prosthesis, other site, initial encounter: Secondary | ICD-10-CM

## 2015-05-18 DIAGNOSIS — Z96649 Presence of unspecified artificial hip joint: Principal | ICD-10-CM

## 2015-05-28 DIAGNOSIS — T8484XD Pain due to internal orthopedic prosthetic devices, implants and grafts, subsequent encounter: Secondary | ICD-10-CM | POA: Diagnosis not present

## 2015-05-28 DIAGNOSIS — T84031D Mechanical loosening of internal left hip prosthetic joint, subsequent encounter: Secondary | ICD-10-CM | POA: Diagnosis not present

## 2015-06-02 DIAGNOSIS — R05 Cough: Secondary | ICD-10-CM | POA: Diagnosis not present

## 2015-06-02 DIAGNOSIS — Z6821 Body mass index (BMI) 21.0-21.9, adult: Secondary | ICD-10-CM | POA: Diagnosis not present

## 2015-06-02 DIAGNOSIS — J329 Chronic sinusitis, unspecified: Secondary | ICD-10-CM | POA: Diagnosis not present

## 2015-06-02 DIAGNOSIS — D849 Immunodeficiency, unspecified: Secondary | ICD-10-CM | POA: Diagnosis not present

## 2015-06-11 ENCOUNTER — Other Ambulatory Visit: Payer: Self-pay | Admitting: Orthopedic Surgery

## 2015-06-11 DIAGNOSIS — Z96649 Presence of unspecified artificial hip joint: Principal | ICD-10-CM

## 2015-06-11 DIAGNOSIS — Z96642 Presence of left artificial hip joint: Secondary | ICD-10-CM | POA: Diagnosis not present

## 2015-06-11 DIAGNOSIS — T84018D Broken internal joint prosthesis, other site, subsequent encounter: Secondary | ICD-10-CM | POA: Diagnosis not present

## 2015-06-15 ENCOUNTER — Ambulatory Visit
Admission: RE | Admit: 2015-06-15 | Discharge: 2015-06-15 | Disposition: A | Discharge status: Home / Self Care | Payer: Medicare Other | Source: Ambulatory Visit | Attending: Orthopedic Surgery | Admitting: Orthopedic Surgery

## 2015-06-15 DIAGNOSIS — T84018D Broken internal joint prosthesis, other site, subsequent encounter: Secondary | ICD-10-CM

## 2015-06-15 DIAGNOSIS — K8020 Calculus of gallbladder without cholecystitis without obstruction: Secondary | ICD-10-CM | POA: Diagnosis not present

## 2015-06-15 DIAGNOSIS — Z96649 Presence of unspecified artificial hip joint: Principal | ICD-10-CM

## 2015-06-15 MED ORDER — IOPAMIDOL (ISOVUE-370) INJECTION 76%
80.0000 mL | Freq: Once | INTRAVENOUS | Status: AC | PRN
  Administered 2015-06-15: 80 mL via INTRAVENOUS

## 2015-06-22 DIAGNOSIS — T8484XD Pain due to internal orthopedic prosthetic devices, implants and grafts, subsequent encounter: Secondary | ICD-10-CM | POA: Diagnosis not present

## 2015-06-22 DIAGNOSIS — T84018D Broken internal joint prosthesis, other site, subsequent encounter: Secondary | ICD-10-CM | POA: Diagnosis not present

## 2015-06-22 DIAGNOSIS — T84031D Mechanical loosening of internal left hip prosthetic joint, subsequent encounter: Secondary | ICD-10-CM | POA: Diagnosis not present

## 2015-07-13 ENCOUNTER — Encounter: Payer: Self-pay | Admitting: Cardiovascular Disease

## 2015-07-13 ENCOUNTER — Ambulatory Visit (INDEPENDENT_AMBULATORY_CARE_PROVIDER_SITE_OTHER): Payer: Medicare Other | Admitting: Cardiovascular Disease

## 2015-07-13 VITALS — BP 136/66 | HR 82 | Ht 63.0 in | Wt 135.0 lb

## 2015-07-13 DIAGNOSIS — I10 Essential (primary) hypertension: Secondary | ICD-10-CM | POA: Diagnosis not present

## 2015-07-13 DIAGNOSIS — Z01818 Encounter for other preprocedural examination: Secondary | ICD-10-CM | POA: Diagnosis not present

## 2015-07-13 NOTE — Assessment & Plan Note (Addendum)
History of hypertension on low-dose amlodipine with blood pressure measured at 136/66. Continued current meds at current dosing

## 2015-07-13 NOTE — Patient Instructions (Signed)
Medication Instructions:  Your physician recommends that you continue on your current medications as directed. Please refer to the Current Medication list given to you today.   Labwork: none  Testing/Procedures: Your physician has requested that you have a lexiscan myoview. For further information please visit [email or web address]. Please follow instruction sheet, as given.    Follow-Up: Follow up with Dr. Gwenlyn Found as needed.   Any Other Special Instructions Will Be Listed Below (If Applicable).     If you need a refill on your cardiac medications before your next appointment, please call your pharmacy.

## 2015-07-13 NOTE — Progress Notes (Signed)
07/13/2015 John Lyons   06-30-1945  [id number]  Primary Physician [name], MD Primary Cardiologist: Lorretta Harp MD Renae Gloss   HPI:  John Lyons is a delightful 70 year old thin and frail-appearing married Caucasian male father of one, grandfather to 2 grandchildren is accompanied by his wife John Lyons. He is referred by Dr. Joylene Draft for cardiovascular preoperative clearance before elective left total hip replacement. His only risk factor is treated high per tension. His mother did die of myocardial infarctions at age 91. He has a history of rheumatoid arthritis and Addison's disease. He has had a right total knee replacement by Dr. Lorre Nick several years ago and a remote left total hip replacement in 1979 by Dr. Collie Siad. He denies chest pain or shortness of breath. He needs a redo left total hip replacement and was referred for preoperative cardiovascular clearance.   Current Outpatient Prescriptions  Medication Sig Dispense Refill  . acetaminophen (TYLENOL) 500 MG tablet Take 500 mg by mouth every 6 (six) hours as needed. For pain    . ALPRAZolam (XANAX) 0.25 MG tablet Take 0.25 mg by mouth at bedtime.    Marland Kitchen amLODipine (NORVASC) 2.5 MG tablet Take 2.5 mg by mouth daily.    . Calcium Carbonate-Vitamin D (CALCIUM-VITAMIN D) 500-200 MG-UNIT per tablet Take 1 tablet by mouth daily.     . Cholecalciferol (VITAMIN D3) 1000 UNITS CAPS Take 1,000 Units by mouth daily.     . digoxin (LANOXIN) 0.125 MG tablet Take 0.125 mg by mouth daily.    . fludrocortisone (FLORINEF) 0.1 MG tablet Take 0.1 mg by mouth daily.    . fluocinonide cream (LIDEX) 0.05 % Apply topically daily as needed.    . folic acid (FOLVITE) A999333 MCG tablet Take 400 mcg by mouth daily.    . hydrocortisone (CORTEF) 20 MG tablet Take 20 mg by mouth daily.     . hydroxychloroquine (PLAQUENIL) 200 MG tablet Take 200 mg by mouth daily.    . metroNIDAZOLE (METROGEL) 1 % gel Apply topically daily as needed.    . Multiple  Vitamin (MULTIVITAMIN WITH MINERALS) TABS Take 1 tablet by mouth daily.    . mupirocin cream (BACTROBAN) 2 % Apply topically daily as needed.    Marland Kitchen olopatadine (PATANOL) 0.1 % ophthalmic solution Place 1 drop into both eyes 2 (two) times daily as needed. For allergies    . pantoprazole (PROTONIX) 40 MG tablet Take 40 mg by mouth daily.    . pravastatin (PRAVACHOL) 20 MG tablet Take 20 mg by mouth daily.    Marland Kitchen telmisartan (MICARDIS) 80 MG tablet Take 40 mg by mouth daily.    Marland Kitchen triamcinolone (NASACORT) 55 MCG/ACT nasal inhaler Place 2 sprays into the nose daily as needed. For allergies    . zoledronic acid (RECLAST) 5 MG/100ML SOLN injection Inject 5 mg into the vein once.     No current facility-administered medications for this visit.    Allergies  Allergen Reactions  . Adhesive [Tape]     Rash    . Phenobarbital Rash    Social History   Social History  . Marital Status: Married    Spouse Name: N/A  . Number of Children: N/A  . Years of Education: N/A   Occupational History  . Not on file.   Social History Main Topics  . Smoking status: Never Smoker   . Smokeless tobacco: Never Used  . Alcohol Use: No  . Drug Use: No  . Sexual Activity: Not on file  Other Topics Concern  . Not on file   Social History Narrative     Review of Systems: General: negative for chills, fever, night sweats or weight changes.  Cardiovascular: negative for chest pain, dyspnea on exertion, edema, orthopnea, palpitations, paroxysmal nocturnal dyspnea or shortness of breath Dermatological: negative for rash Respiratory: negative for cough or wheezing Urologic: negative for hematuria Abdominal: negative for nausea, vomiting, diarrhea, bright red blood per rectum, melena, or hematemesis Neurologic: negative for visual changes, syncope, or dizziness All other systems reviewed and are otherwise negative except as noted above.    Blood pressure 136/66, pulse 82, height 5\' 3"  (1.6 m), weight 135  lb (61.236 kg).  General appearance: alert and no distress Neck: no adenopathy, no carotid bruit, no JVD, supple, symmetrical, trachea midline and thyroid not enlarged, symmetric, no tenderness/mass/nodules Lungs: clear to auscultation bilaterally Heart: regular rate and rhythm, S1, S2 normal, no murmur, click, rub or gallop Extremities: extremities normal, atraumatic, no cyanosis or edema  EKG sinus rhythm 82 without ST or T-wave changes. I personally reviewed this EKG  ASSESSMENT AND PLAN:   Essential hypertension History of hypertension on low-dose amlodipine with blood pressure measured at 136/66. Continued current meds at current dosing      Lorretta Harp MD [facility], [facility] 07/13/2015 4:36 PM

## 2015-07-15 ENCOUNTER — Ambulatory Visit (HOSPITAL_COMMUNITY)
Admission: RE | Admit: 2015-07-15 | Discharge: 2015-07-15 | Disposition: A | Discharge status: Home / Self Care | Payer: Medicare Other | Source: Ambulatory Visit | Attending: Cardiology | Admitting: Cardiology

## 2015-07-15 DIAGNOSIS — Z01818 Encounter for other preprocedural examination: Secondary | ICD-10-CM

## 2015-07-15 DIAGNOSIS — Z79899 Other long term (current) drug therapy: Secondary | ICD-10-CM | POA: Diagnosis not present

## 2015-07-15 DIAGNOSIS — Z8249 Family history of ischemic heart disease and other diseases of the circulatory system: Secondary | ICD-10-CM | POA: Insufficient documentation

## 2015-07-15 DIAGNOSIS — M069 Rheumatoid arthritis, unspecified: Secondary | ICD-10-CM | POA: Diagnosis not present

## 2015-07-15 DIAGNOSIS — I10 Essential (primary) hypertension: Secondary | ICD-10-CM | POA: Insufficient documentation

## 2015-07-15 LAB — MYOCARDIAL PERFUSION IMAGING
CHL CUP NUCLEAR SSS: 2
CHL CUP RESTING HR STRESS: 71 {beats}/min
CSEPPHR: 86 {beats}/min
LV dias vol: 59 mL (ref 62–150)
LVSYSVOL: 23 mL
NUC STRESS TID: 1.19
SDS: 1
SRS: 1

## 2015-07-15 MED ORDER — TECHNETIUM TC 99M SESTAMIBI GENERIC - CARDIOLITE
10.5000 | Freq: Once | INTRAVENOUS | Status: AC | PRN
  Administered 2015-07-15: 11 via INTRAVENOUS

## 2015-07-15 MED ORDER — TECHNETIUM TC 99M SESTAMIBI GENERIC - CARDIOLITE
31.6000 | Freq: Once | INTRAVENOUS | Status: AC | PRN
  Administered 2015-07-15: 32 via INTRAVENOUS

## 2015-07-15 MED ORDER — REGADENOSON 0.4 MG/5ML IV SOLN
0.4000 mg | Freq: Once | INTRAVENOUS | Status: AC
  Administered 2015-07-15: 0.4 mg via INTRAVENOUS

## 2015-07-28 ENCOUNTER — Ambulatory Visit: Payer: Medicare Other | Admitting: Cardiovascular Disease

## 2015-08-04 ENCOUNTER — Telehealth: Payer: Self-pay

## 2015-08-04 NOTE — Telephone Encounter (Signed)
Requesting surgical clearance:   1. Type of surgery: Left Hip: revision left THA, acetabular and femoral components, posterior approach  2. Surgeon: Dr. Aaron Edelman Swinteck  3. Surgical date: pending clearance  4. Medications that need to be held: n/a  5. CAD: no     6. I will defer to: Gwenlyn Found * Pt had normal Myoview at end of March and last OV was March also.  [facility] AttnSantiago Bur [phone] phone [phone] Fax

## 2015-08-08 NOTE — Telephone Encounter (Signed)
Nl MV, cleared for ortho surg at low risk

## 2015-08-09 ENCOUNTER — Emergency Department (HOSPITAL_COMMUNITY)
Admission: EM | Admit: 2015-08-09 | Discharge: 2015-08-10 | Disposition: A | Discharge status: Home / Self Care | Payer: Medicare Other | Source: Home / Self Care | Attending: Emergency Medicine | Admitting: Emergency Medicine

## 2015-08-09 ENCOUNTER — Emergency Department (HOSPITAL_COMMUNITY): Payer: Medicare Other

## 2015-08-09 ENCOUNTER — Encounter (HOSPITAL_COMMUNITY): Payer: Self-pay | Admitting: *Deleted

## 2015-08-09 DIAGNOSIS — K5730 Diverticulosis of large intestine without perforation or abscess without bleeding: Secondary | ICD-10-CM | POA: Diagnosis not present

## 2015-08-09 DIAGNOSIS — Z79899 Other long term (current) drug therapy: Secondary | ICD-10-CM

## 2015-08-09 DIAGNOSIS — B359 Dermatophytosis, unspecified: Secondary | ICD-10-CM | POA: Diagnosis not present

## 2015-08-09 DIAGNOSIS — S22000A Wedge compression fracture of unspecified thoracic vertebra, initial encounter for closed fracture: Secondary | ICD-10-CM | POA: Diagnosis not present

## 2015-08-09 DIAGNOSIS — R1011 Right upper quadrant pain: Secondary | ICD-10-CM

## 2015-08-09 DIAGNOSIS — M545 Low back pain: Secondary | ICD-10-CM | POA: Diagnosis not present

## 2015-08-09 DIAGNOSIS — E271 Primary adrenocortical insufficiency: Secondary | ICD-10-CM | POA: Diagnosis not present

## 2015-08-09 DIAGNOSIS — K8000 Calculus of gallbladder with acute cholecystitis without obstruction: Secondary | ICD-10-CM | POA: Diagnosis not present

## 2015-08-09 DIAGNOSIS — R509 Fever, unspecified: Secondary | ICD-10-CM | POA: Diagnosis not present

## 2015-08-09 DIAGNOSIS — R109 Unspecified abdominal pain: Secondary | ICD-10-CM

## 2015-08-09 DIAGNOSIS — M47895 Other spondylosis, thoracolumbar region: Secondary | ICD-10-CM | POA: Diagnosis not present

## 2015-08-09 DIAGNOSIS — K8080 Other cholelithiasis without obstruction: Secondary | ICD-10-CM | POA: Diagnosis not present

## 2015-08-09 DIAGNOSIS — I10 Essential (primary) hypertension: Secondary | ICD-10-CM | POA: Diagnosis not present

## 2015-08-09 DIAGNOSIS — D72829 Elevated white blood cell count, unspecified: Secondary | ICD-10-CM | POA: Diagnosis not present

## 2015-08-09 DIAGNOSIS — M546 Pain in thoracic spine: Secondary | ICD-10-CM | POA: Diagnosis not present

## 2015-08-09 DIAGNOSIS — E785 Hyperlipidemia, unspecified: Secondary | ICD-10-CM | POA: Diagnosis not present

## 2015-08-09 DIAGNOSIS — K219 Gastro-esophageal reflux disease without esophagitis: Secondary | ICD-10-CM | POA: Insufficient documentation

## 2015-08-09 DIAGNOSIS — K8020 Calculus of gallbladder without cholecystitis without obstruction: Secondary | ICD-10-CM | POA: Diagnosis not present

## 2015-08-09 DIAGNOSIS — M069 Rheumatoid arthritis, unspecified: Secondary | ICD-10-CM | POA: Diagnosis not present

## 2015-08-09 LAB — CBC
HCT: 42.5 % (ref 39.0–52.0)
Hemoglobin: 14.5 g/dL (ref 13.0–17.0)
MCH: 29.5 pg (ref 26.0–34.0)
MCHC: 34.1 g/dL (ref 30.0–36.0)
MCV: 86.4 fL (ref 78.0–100.0)
PLATELETS: 300 10*3/uL (ref 150–400)
RBC: 4.92 MIL/uL (ref 4.22–5.81)
RDW: 12.7 % (ref 11.5–15.5)
WBC: 13.3 10*3/uL — ABNORMAL HIGH (ref 4.0–10.5)

## 2015-08-09 LAB — COMPREHENSIVE METABOLIC PANEL
ALK PHOS: 65 U/L (ref 38–126)
ALT: 22 U/L (ref 17–63)
AST: 23 U/L (ref 15–41)
Albumin: 3.9 g/dL (ref 3.5–5.0)
Anion gap: 12 (ref 5–15)
BILIRUBIN TOTAL: 0.9 mg/dL (ref 0.3–1.2)
BUN: 10 mg/dL (ref 6–20)
CALCIUM: 9 mg/dL (ref 8.9–10.3)
CO2: 21 mmol/L — ABNORMAL LOW (ref 22–32)
CREATININE: 0.5 mg/dL — AB (ref 0.61–1.24)
Chloride: 101 mmol/L (ref 101–111)
GFR calc non Af Amer: >60 mL/min (ref >60)
Glucose, Bld: 123 mg/dL — ABNORMAL HIGH (ref 65–99)
Potassium: 3.3 mmol/L — ABNORMAL LOW (ref 3.5–5.1)
SODIUM: 134 mmol/L — AB (ref 135–145)
Total Protein: 6.9 g/dL (ref 6.5–8.1)

## 2015-08-09 LAB — DIFFERENTIAL
BASOS ABS: 0 10*3/uL (ref 0.0–0.1)
BASOS PCT: 0 %
Eosinophils Absolute: 0 10*3/uL (ref 0.0–0.7)
Eosinophils Relative: 0 %
Lymphocytes Relative: 7 %
Lymphs Abs: 0.7 10*3/uL (ref 0.7–4.0)
MONO ABS: 0.8 10*3/uL (ref 0.1–1.0)
Monocytes Relative: 8 %
NEUTROS ABS: 8.2 10*3/uL — AB (ref 1.7–7.7)
NEUTROS PCT: 85 %

## 2015-08-09 LAB — URINALYSIS, ROUTINE W REFLEX MICROSCOPIC
Bilirubin Urine: NEGATIVE
GLUCOSE, UA: NEGATIVE mg/dL
Hgb urine dipstick: NEGATIVE
KETONES UR: NEGATIVE mg/dL
Leukocytes, UA: NEGATIVE
Nitrite: NEGATIVE
PROTEIN: NEGATIVE mg/dL
Specific Gravity, Urine: 1.01 (ref 1.005–1.030)
pH: 5.5 (ref 5.0–8.0)

## 2015-08-09 LAB — I-STAT CG4 LACTIC ACID, ED: LACTIC ACID, VENOUS: 1.42 mmol/L (ref 0.5–2.0)

## 2015-08-09 MED ORDER — SODIUM CHLORIDE 0.9 % IV BOLUS (SEPSIS)
1000.0000 mL | Freq: Once | INTRAVENOUS | Status: AC
  Administered 2015-08-09: 1000 mL via INTRAVENOUS

## 2015-08-09 MED ORDER — MORPHINE SULFATE (PF) 4 MG/ML IV SOLN
4.0000 mg | Freq: Once | INTRAVENOUS | Status: AC
  Administered 2015-08-09: 4 mg via INTRAVENOUS
  Filled 2015-08-09: qty 1

## 2015-08-09 MED ORDER — IOPAMIDOL (ISOVUE-300) INJECTION 61%
INTRAVENOUS | Status: AC
  Administered 2015-08-09: 100 mL
  Filled 2015-08-09: qty 100

## 2015-08-09 MED ORDER — HYDROCODONE-ACETAMINOPHEN 5-325 MG PO TABS
1.0000 | ORAL_TABLET | Freq: Once | ORAL | Status: AC
  Administered 2015-08-09: 1 via ORAL
  Filled 2015-08-09: qty 1

## 2015-08-09 MED ORDER — ONDANSETRON HCL 4 MG/2ML IJ SOLN
4.0000 mg | Freq: Once | INTRAMUSCULAR | Status: AC
  Administered 2015-08-09: 4 mg via INTRAVENOUS
  Filled 2015-08-09: qty 2

## 2015-08-09 NOTE — ED Notes (Signed)
Pt states 1 episode of diarrhea Sun am and R side pain that radiates to back and nausea since last night.  States hx of Addison's dz and was told by his MD to take to hydrocortisone and to come to ED.

## 2015-08-09 NOTE — ED Provider Notes (Signed)
CSN: [id number]     Arrival date & time 08/09/15  1417 History   First MD Initiated Contact with Patient 08/09/15 1927     Chief Complaint  Patient presents with  . Abdominal Pain     (Consider location/radiation/quality/duration/timing/severity/associated sxs/prior Treatment) HPI 70 year old male who presents with right upper quadrant abdominal pain and flank pain. History of addison's disease, HTN, HLD and RA. H/o of dysrhythmia on digoxin. States onset of some intermittent right flank pain 2 days ago, but yesterday evening had onset of severe RUQ abdominal pain radiating across to right flank that has been constant throughout the day. States it was atfer eating a small dinner, which he normally has without issues. With some loose stools and nausea, but no vomiting. Fever of 100.58F at home prior to arrival. No dysuria, urinary frequency, hematuria, testicular pain or swelling. No cough, chest pain, sob, or URI symptoms.  Past Medical History  Diagnosis Date  . Addison's disease ([place])   . PONV (postoperative nausea and vomiting)   . Rheumatoid arthritis(714.0)   . Hypertension   . H/O hiatal hernia   . GERD (gastroesophageal reflux disease)   . Dysrhythmia     hx rapid heart rate in 70s, on digoxin  . Osteoporosis    Past Surgical History  Procedure Laterality Date  . Total knee arthroplasty  1984    Left   . Total hip arthroplasty  1979    bilat  . Eye surgery      cataracts  . Cardiovascular stress test  01/04/12    Dr. Gwenlyn Found - [facility]  . Total knee arthroplasty  02/12/2012    right  . Total knee arthroplasty  02/12/2012    Procedure: TOTAL KNEE ARTHROPLASTY;  Surgeon: Rudean Haskell, MD;  Location: [facility];  Service: Orthopedics;  Laterality: Right;  . Vasectomy  1979   No family history on file. Social History  Substance Use Topics  . Smoking status: Never Smoker   . Smokeless tobacco: Never Used  . Alcohol Use: No    Review of Systems 10/14 systems  reviewed and are negative other than those stated in the HPI    Allergies  Adhesive and Phenobarbital  Home Medications   Prior to Admission medications   Medication Sig Start Date End Date Taking? Authorizing Provider  acetaminophen (TYLENOL) 500 MG tablet Take 1,000 mg by mouth every 6 (six) hours as needed for mild pain. For pain   Yes Historical Provider, MD  ALPRAZolam (XANAX) 0.25 MG tablet Take 0.25 mg by mouth at bedtime.   Yes Historical Provider, MD  amLODipine (NORVASC) 2.5 MG tablet Take 2.5 mg by mouth daily.   Yes Historical Provider, MD  Calcium Carbonate-Vitamin D (CALCIUM-VITAMIN D) 500-200 MG-UNIT per tablet Take 1 tablet by mouth daily.    Yes Historical Provider, MD  Cholecalciferol (VITAMIN D3) 1000 UNITS CAPS Take 1,000 Units by mouth daily.    Yes Historical Provider, MD  digoxin (LANOXIN) 0.125 MG tablet Take 0.125 mg by mouth daily.   Yes Historical Provider, MD  fludrocortisone (FLORINEF) 0.1 MG tablet Take 0.1 mg by mouth daily.   Yes Historical Provider, MD  folic acid (FOLVITE) A999333 MCG tablet Take 400 mcg by mouth daily.   Yes Historical Provider, MD  hydrocortisone (CORTEF) 20 MG tablet Take 20 mg by mouth daily.    Yes Historical Provider, MD  hydroxychloroquine (PLAQUENIL) 200 MG tablet Take 200 mg by mouth daily.   Yes Historical  Provider, MD  linaclotide Rolan Lipa) 145 MCG CAPS capsule Take 145 mcg by mouth daily as needed (constipation).   Yes Historical Provider, MD  Multiple Vitamin (MULTIVITAMIN WITH MINERALS) TABS Take 1 tablet by mouth daily.   Yes Historical Provider, MD  pantoprazole (PROTONIX) 40 MG tablet Take 40 mg by mouth daily.   Yes Historical Provider, MD  pravastatin (PRAVACHOL) 20 MG tablet Take 20 mg by mouth daily.   Yes Historical Provider, MD  Propylene Glycol-Glycerin (SOOTHE) 0.6-0.6 % SOLN Place 1 drop into both eyes daily as needed (dryness).   Yes Historical Provider, MD  telmisartan (MICARDIS) 80 MG tablet Take 40 mg by mouth  daily.   Yes Historical Provider, MD  traMADol (ULTRAM) 50 MG tablet Take 50 mg by mouth every 6 (six) hours as needed for moderate pain.   Yes Historical Provider, MD  zoledronic acid (RECLAST) 5 MG/100ML SOLN injection Inject 5 mg into the vein See admin instructions. Once a year   Yes Historical Provider, MD  HYDROcodone-acetaminophen (NORCO) 5-325 MG tablet Take 1 tablet by mouth every 6 (six) hours as needed for moderate pain or severe pain. 08/10/15   Forde Dandy, MD   BP 165/70 mmHg  Pulse 85  Temp(Src) 98.5 F (36.9 C) (Oral)  Resp 16  Ht 5\' 3"  (1.6 m)  Wt 130 lb (58.968 kg)  BMI 23.03 kg/m2  SpO2 94% Physical Exam Physical Exam  Nursing note and vitals reviewed. Constitutional: Well developed, well nourished, non-toxic, and in no acute distress Head: Normocephalic and atraumatic.  Mouth/Throat: Oropharynx is clear and moist.  Neck: Normal range of motion. Neck supple.  Cardiovascular: Normal rate and regular rhythm.  No edema. Pulmonary/Chest: Effort normal and breath sounds normal.  Abdominal: Soft. There is no distension. RUQ tenderness. There is no rebound and no guarding. Some right CVA tenderness. Pain also reproduced with palpation of the right paraspinal muscles.  Musculoskeletal: Normal range of motion.  Neurological: Alert, no facial droop, fluent speech, moves all extremities symmetrically Skin: Skin is warm and dry.  Psychiatric: Cooperative  ED Course  Procedures (including critical care time) Labs Review Labs Reviewed  COMPREHENSIVE METABOLIC PANEL - Abnormal; Notable for the following:    Sodium 134 (*)    Potassium 3.3 (*)    CO2 21 (*)    Glucose, Bld 123 (*)    Creatinine, Ser 0.50 (*)    All other components within normal limits  CBC - Abnormal; Notable for the following:    WBC 13.3 (*)    All other components within normal limits  DIFFERENTIAL - Abnormal; Notable for the following:    Neutro Abs 8.2 (*)    All other components within normal  limits  URINALYSIS, ROUTINE W REFLEX MICROSCOPIC (NOT AT [facility])  I-STAT CG4 LACTIC ACID, ED    Imaging Review Ct Abdomen Pelvis W Contrast  08/09/2015  CLINICAL DATA:  Left flank pain since Saturday.  Nausea and fever. EXAM: CT ABDOMEN AND PELVIS WITH CONTRAST TECHNIQUE: Multidetector CT imaging of the abdomen and pelvis was performed using the standard protocol following bolus administration of intravenous contrast. CONTRAST:  1 ISOVUE-300 IOPAMIDOL (ISOVUE-300) INJECTION 61% COMPARISON:  06/15/2015 FINDINGS: Atelectasis in the lung bases.  Small esophageal hiatal hernia. Sub cm low-attenuation lesion in the dome of the liver is too small to characterize but probably represents a small cyst. No change since previous study. Cholelithiasis with small stones in the gallbladder. No inflammatory infiltration. No bile duct dilatation. The pancreas, spleen, adrenal  glands, kidneys, abdominal aorta, inferior vena cava, and retroperitoneal lymph nodes are unremarkable. Stomach, small bowel, and colon are not abnormally distended. Scattered stool and fluid in the colon. No free air or free fluid in the abdomen. Pelvis: Visualization limited due streak artifact from bilateral hip prostheses. Prostate gland appears enlarged. Bladder wall is not thickened. No pelvic mass or lymphadenopathy. Appendix is not identified but no inflammatory changes are suggested. Scattered diverticula in the colon without evidence of diverticulitis. Degenerative changes in the spine. No destructive bone lesions. IMPRESSION: Cholelithiasis.  No acute abnormalities demonstrated. Electronically Signed   By: Lucienne Capers M.D.   On: 08/09/2015 23:04   US Abdomen Limited Ruq  08/09/2015  CLINICAL DATA:  Right upper quadrant pain with nausea for 3 days EXAM: US ABDOMEN LIMITED - RIGHT UPPER QUADRANT COMPARISON:  CT abdomen and pelvis June 15, 2015 FINDINGS: Gallbladder: Within the gallbladder, there are echogenic foci which move and  shadow consistent with gallstones. Largest gallstone measures 8 mm in length. There is no gallbladder wall thickening or pericholecystic fluid. No sonographic Murphy sign noted by sonographer. Common bile duct: Diameter: 4 mm. No intrahepatic or extrahepatic biliary duct dilatation. Liver: No focal lesion identified. Within normal limits in parenchymal echogenicity. IMPRESSION: Cholelithiasis.  Study otherwise unremarkable. Electronically Signed   By: Lowella Grip III M.D.   On: 08/09/2015 21:08   I have personally reviewed and evaluated these images and lab results as part of my medical decision-making.   EKG Interpretation None      MDM   Final diagnoses:  RUQ pain  Right flank pain    70 year old male who presents with right-sided abdominal and flank pain. He is afebrile here, with normal vital signs. He is nontoxic in no acute distress. Overall was soft and nonsurgical abdomen. With some right upper quadrant tenderness to palpation. Also some pain over the right flank with percussion as well as with palpation of the paraspinal muscles. Blood work with mild leukocytosis of 13. No major metabolic or electrolyte derangements. Unremarkable compared metabolic profile and lipase. UA does not show evidence of infection or blood. Right upper quadrant ultrasound without signs of acute cholecystitis but he does have some cholelithiasis. With analgesics, he states that his pain actually is primarily in the flank and not in his abdomen. Repeat abdominal exam with minimal to no tenderness. A CT abdomen pelvis subsequently performed. This is visualized and shows no acute intra-abdominal process or retroperitoneal process to explain symptoms.   Although states fever at home, he has remained afebrile in ED without antipyretics. He has not shown any evidence of UTI or pneumonia or intra-abdominal/retroperitoneal infection on CT abdomen and pelvis. No midline back tenderness, and at this time no suspicion  for discitis or osteomyelitis. States that pain primarily reproduced with palpation of muscles which makes this more likely MSK pain. Family states that he twists his torso to get up from seated position multiple times per day recently which they think is causing his symptoms. Able to ambulate without difficulty. Felt appropriate for discharge home with close PCP follow-up in next 2-3 days. Strict return and follow-up instructions reviewed. She/He expressed understanding of all discharge instructions and felt comfortable with the plan of care.     Forde Dandy, MD 08/10/15 0030

## 2015-08-09 NOTE — ED Notes (Signed)
MD at bedside. 

## 2015-08-10 ENCOUNTER — Emergency Department (HOSPITAL_COMMUNITY): Payer: Medicare Other

## 2015-08-10 ENCOUNTER — Encounter (HOSPITAL_COMMUNITY): Payer: Self-pay | Admitting: Emergency Medicine

## 2015-08-10 ENCOUNTER — Inpatient Hospital Stay (HOSPITAL_COMMUNITY)
Admission: EM | Admit: 2015-08-10 | Discharge: 2015-08-13 | DRG: 418 | Disposition: A | Discharge status: Home / Self Care | Payer: Medicare Other | Attending: Internal Medicine | Admitting: Internal Medicine

## 2015-08-10 DIAGNOSIS — Z96659 Presence of unspecified artificial knee joint: Secondary | ICD-10-CM | POA: Diagnosis present

## 2015-08-10 DIAGNOSIS — E785 Hyperlipidemia, unspecified: Secondary | ICD-10-CM | POA: Diagnosis present

## 2015-08-10 DIAGNOSIS — I10 Essential (primary) hypertension: Secondary | ICD-10-CM | POA: Diagnosis present

## 2015-08-10 DIAGNOSIS — N400 Enlarged prostate without lower urinary tract symptoms: Secondary | ICD-10-CM | POA: Diagnosis present

## 2015-08-10 DIAGNOSIS — M47895 Other spondylosis, thoracolumbar region: Secondary | ICD-10-CM | POA: Diagnosis not present

## 2015-08-10 DIAGNOSIS — M47815 Spondylosis without myelopathy or radiculopathy, thoracolumbar region: Secondary | ICD-10-CM

## 2015-08-10 DIAGNOSIS — S22000A Wedge compression fracture of unspecified thoracic vertebra, initial encounter for closed fracture: Secondary | ICD-10-CM

## 2015-08-10 DIAGNOSIS — R509 Fever, unspecified: Secondary | ICD-10-CM

## 2015-08-10 DIAGNOSIS — K5790 Diverticulosis of intestine, part unspecified, without perforation or abscess without bleeding: Secondary | ICD-10-CM | POA: Diagnosis present

## 2015-08-10 DIAGNOSIS — Z79899 Other long term (current) drug therapy: Secondary | ICD-10-CM

## 2015-08-10 DIAGNOSIS — Z96643 Presence of artificial hip joint, bilateral: Secondary | ICD-10-CM | POA: Diagnosis present

## 2015-08-10 DIAGNOSIS — Z888 Allergy status to other drugs, medicaments and biological substances status: Secondary | ICD-10-CM

## 2015-08-10 DIAGNOSIS — M810 Age-related osteoporosis without current pathological fracture: Secondary | ICD-10-CM | POA: Diagnosis present

## 2015-08-10 DIAGNOSIS — E860 Dehydration: Secondary | ICD-10-CM | POA: Diagnosis present

## 2015-08-10 DIAGNOSIS — R911 Solitary pulmonary nodule: Secondary | ICD-10-CM | POA: Diagnosis present

## 2015-08-10 DIAGNOSIS — Z7952 Long term (current) use of systemic steroids: Secondary | ICD-10-CM

## 2015-08-10 DIAGNOSIS — M47894 Other spondylosis, thoracic region: Secondary | ICD-10-CM | POA: Diagnosis present

## 2015-08-10 DIAGNOSIS — M545 Low back pain, unspecified: Secondary | ICD-10-CM

## 2015-08-10 DIAGNOSIS — K219 Gastro-esophageal reflux disease without esophagitis: Secondary | ICD-10-CM | POA: Diagnosis present

## 2015-08-10 DIAGNOSIS — K5900 Constipation, unspecified: Secondary | ICD-10-CM | POA: Diagnosis present

## 2015-08-10 DIAGNOSIS — Z96649 Presence of unspecified artificial hip joint: Secondary | ICD-10-CM | POA: Diagnosis present

## 2015-08-10 DIAGNOSIS — B359 Dermatophytosis, unspecified: Secondary | ICD-10-CM | POA: Diagnosis present

## 2015-08-10 DIAGNOSIS — K8000 Calculus of gallbladder with acute cholecystitis without obstruction: Principal | ICD-10-CM | POA: Diagnosis present

## 2015-08-10 DIAGNOSIS — M4800 Spinal stenosis, site unspecified: Secondary | ICD-10-CM | POA: Diagnosis present

## 2015-08-10 DIAGNOSIS — Z7901 Long term (current) use of anticoagulants: Secondary | ICD-10-CM

## 2015-08-10 DIAGNOSIS — Z79891 Long term (current) use of opiate analgesic: Secondary | ICD-10-CM

## 2015-08-10 DIAGNOSIS — M4806 Spinal stenosis, lumbar region: Secondary | ICD-10-CM | POA: Diagnosis present

## 2015-08-10 DIAGNOSIS — M069 Rheumatoid arthritis, unspecified: Secondary | ICD-10-CM | POA: Diagnosis present

## 2015-08-10 DIAGNOSIS — Z952 Presence of prosthetic heart valve: Secondary | ICD-10-CM

## 2015-08-10 DIAGNOSIS — R1011 Right upper quadrant pain: Secondary | ICD-10-CM

## 2015-08-10 DIAGNOSIS — R109 Unspecified abdominal pain: Secondary | ICD-10-CM

## 2015-08-10 DIAGNOSIS — E271 Primary adrenocortical insufficiency: Secondary | ICD-10-CM | POA: Diagnosis present

## 2015-08-10 DIAGNOSIS — E876 Hypokalemia: Secondary | ICD-10-CM | POA: Diagnosis present

## 2015-08-10 DIAGNOSIS — M546 Pain in thoracic spine: Secondary | ICD-10-CM | POA: Diagnosis not present

## 2015-08-10 LAB — URINALYSIS, ROUTINE W REFLEX MICROSCOPIC
BILIRUBIN URINE: NEGATIVE
Glucose, UA: NEGATIVE mg/dL
HGB URINE DIPSTICK: NEGATIVE
Ketones, ur: NEGATIVE mg/dL
Leukocytes, UA: NEGATIVE
Nitrite: NEGATIVE
Protein, ur: NEGATIVE mg/dL
SPECIFIC GRAVITY, URINE: 1.016 (ref 1.005–1.030)
pH: 5.5 (ref 5.0–8.0)

## 2015-08-10 LAB — BASIC METABOLIC PANEL
Anion gap: 10 (ref 5–15)
BUN: 7 mg/dL (ref 6–20)
CALCIUM: 8.6 mg/dL — AB (ref 8.9–10.3)
CO2: 26 mmol/L (ref 22–32)
CREATININE: 0.65 mg/dL (ref 0.61–1.24)
Chloride: 102 mmol/L (ref 101–111)
GFR calc Af Amer: >60 mL/min (ref >60)
Glucose, Bld: 134 mg/dL — ABNORMAL HIGH (ref 65–99)
Potassium: 3.2 mmol/L — ABNORMAL LOW (ref 3.5–5.1)
SODIUM: 138 mmol/L (ref 135–145)

## 2015-08-10 LAB — CBC WITH DIFFERENTIAL/PLATELET
Basophils Absolute: 0 K/uL (ref 0.0–0.1)
Basophils Relative: 0 %
Eosinophils Absolute: 0 K/uL (ref 0.0–0.7)
Eosinophils Relative: 0 %
HCT: 38.5 % — ABNORMAL LOW (ref 39.0–52.0)
Hemoglobin: 13.3 g/dL (ref 13.0–17.0)
Lymphocytes Relative: 4 %
Lymphs Abs: 0.5 K/uL — ABNORMAL LOW (ref 0.7–4.0)
MCH: 30.2 pg (ref 26.0–34.0)
MCHC: 34.5 g/dL (ref 30.0–36.0)
MCV: 87.3 fL (ref 78.0–100.0)
Monocytes Absolute: 1 K/uL (ref 0.1–1.0)
Monocytes Relative: 8 %
Neutro Abs: 10.7 K/uL — ABNORMAL HIGH (ref 1.7–7.7)
Neutrophils Relative %: 88 %
Platelets: 316 K/uL (ref 150–400)
RBC: 4.41 MIL/uL (ref 4.22–5.81)
RDW: 12.7 % (ref 11.5–15.5)
WBC: 12.3 K/uL — ABNORMAL HIGH (ref 4.0–10.5)

## 2015-08-10 LAB — SEDIMENTATION RATE: SED RATE: 34 mm/h — AB (ref 0–16)

## 2015-08-10 LAB — C-REACTIVE PROTEIN: CRP: 17.5 mg/dL — ABNORMAL HIGH

## 2015-08-10 LAB — I-STAT CG4 LACTIC ACID, ED: LACTIC ACID, VENOUS: 1.51 mmol/L (ref 0.5–2.0)

## 2015-08-10 MED ORDER — HYDROCODONE-ACETAMINOPHEN 5-325 MG PO TABS: 1.0000 | ORAL_TABLET | Freq: Four times a day (QID) | ORAL | Status: DC | PRN

## 2015-08-10 MED ORDER — MORPHINE SULFATE (PF) 2 MG/ML IV SOLN
2.0000 mg | Freq: Once | INTRAVENOUS | Status: AC
  Administered 2015-08-10: 2 mg via INTRAVENOUS
  Filled 2015-08-10: qty 1

## 2015-08-10 MED ORDER — POTASSIUM CHLORIDE CRYS ER 20 MEQ PO TBCR
40.0000 meq | EXTENDED_RELEASE_TABLET | Freq: Once | ORAL | Status: AC
  Administered 2015-08-10: 40 meq via ORAL
  Filled 2015-08-10: qty 2

## 2015-08-10 MED ORDER — ACETAMINOPHEN 325 MG PO TABS
325.0000 mg | ORAL_TABLET | Freq: Once | ORAL | Status: DC
  Filled 2015-08-10: qty 1

## 2015-08-10 MED ORDER — ACETAMINOPHEN 325 MG PO TABS
650.0000 mg | ORAL_TABLET | Freq: Once | ORAL | Status: AC
  Administered 2015-08-10: 650 mg via ORAL
  Filled 2015-08-10: qty 2

## 2015-08-10 MED ORDER — DIAZEPAM 5 MG PO TABS
5.0000 mg | ORAL_TABLET | Freq: Once | ORAL | Status: AC
  Administered 2015-08-10: 5 mg via ORAL
  Filled 2015-08-10: qty 1

## 2015-08-10 NOTE — Discharge Instructions (Signed)
Your pain seems more related to musculoskeletal pain. Please take medications only as prescribed. He can also try icing or heat packs to the area for pain control. Please see her primary care doctor in 2-3 days for reevaluation. Your workup today does not show any infection in your chest, abdomen, or urine. Please return without fail for worsening symptoms, including confusion, persistent fevers, vomiting unable to keep down food or fluids,  worsening pain, inability to walk, or any other symptoms concerning to you.  Flank Pain Flank pain is pain in your side. The flank is the area of your side between your upper belly (abdomen) and your back. Pain in this area can be caused by many different things. [place] care and treatment will depend on the cause of your pain.  Rest as told by your doctor.  Drink enough fluids to keep your pee (urine) clear or pale yellow.  Only take medicine as told by your doctor.  Tell your doctor about any changes in your pain.  Follow up with your doctor. GET HELP RIGHT AWAY IF:   Your pain does not get better with medicine.   You have new symptoms or your symptoms get worse.  Your pain gets worse.   You have belly (abdominal) pain.   You are short of breath.   You always feel sick to your stomach (nauseous).   You keep throwing up (vomiting).   You have puffiness (swelling) in your belly.   You feel light-headed or you pass out (faint).   You have blood in your pee.  You have a fever or lasting symptoms for more than 2-3 days.  You have a fever and your symptoms suddenly get worse. MAKE SURE YOU:   Understand these instructions.  Will watch your condition.  Will get help right away if you are not doing well or get worse.   This information is not intended to replace advice given to you by your health care provider. Make sure you discuss any questions you have with your health care provider.   Document Released: 01/18/2008  Document Revised: 05/01/2014 Document Reviewed: 11/23/2011 Elsevier Interactive Patient Education [employer].

## 2015-08-10 NOTE — ED Notes (Signed)
Pt left with all his belongings and was wheeled out of the treatment area.  

## 2015-08-10 NOTE — ED Notes (Signed)
Pt ambulated with no problems  

## 2015-08-10 NOTE — ED Notes (Signed)
Pt sts increasing fever and back pain; pt seen here yesterday for same

## 2015-08-10 NOTE — ED Notes (Signed)
Pt turned onto side with pillow behind back for support. Also gave pt a heat pack. Pt reports he is comfortable at this time.

## 2015-08-10 NOTE — ED Notes (Signed)
Family at bedside updated on plan of care.

## 2015-08-10 NOTE — ED Provider Notes (Signed)
CSN: [id number]     Arrival date & time 08/10/15  1156 History   First MD Initiated Contact with Patient 08/10/15 1347     Chief Complaint  Patient presents with  . Fever  . Back Pain     (Consider location/radiation/quality/duration/timing/severity/associated sxs/prior Treatment) Patient is a 70 y.o. male presenting with fever, back pain, and general illness. The history is provided by the patient.  Fever Associated symptoms: myalgias   Associated symptoms: no chest pain, no chills, no confusion, no congestion, no diarrhea, no headaches, no rash and no vomiting   Back Pain Associated symptoms: fever   Associated symptoms: no abdominal pain, no chest pain and no headaches   Illness Severity:  Moderate Onset quality:  Gradual Duration:  2 days Timing:  Constant Progression:  Worsening Chronicity:  New Associated symptoms: fever and myalgias   Associated symptoms: no abdominal pain, no chest pain, no congestion, no diarrhea, no headaches, no rash, no shortness of breath and no vomiting    70 yo M With a chief complaints of low back pain and fever. This been going on for the past 3 or 4 days. Patient was seen here yesterday for the same. Had a CT scan done that was negative for acute intra-abdominal pathology or any bony pathology. Patient had a negative UA at that time as well. He called his family doctor this afternoon they told to come back to emergency department and they are concerned for possible discitis with his chronic immunosuppression for his rheumatoid arthritis.  Past Medical History  Diagnosis Date  . Addison's disease ([place])   . PONV (postoperative nausea and vomiting)   . Rheumatoid arthritis(714.0)   . Hypertension   . H/O hiatal hernia   . GERD (gastroesophageal reflux disease)   . Dysrhythmia     hx rapid heart rate in 70s, on digoxin  . Osteoporosis    Past Surgical History  Procedure Laterality Date  . Total knee arthroplasty  1984    Left   . Total hip  arthroplasty  1979    bilat  . Eye surgery      cataracts  . Cardiovascular stress test  01/04/12    Dr. Gwenlyn Found - [facility]  . Total knee arthroplasty  02/12/2012    right  . Total knee arthroplasty  02/12/2012    Procedure: TOTAL KNEE ARTHROPLASTY;  Surgeon: Rudean Haskell, MD;  Location: [facility];  Service: Orthopedics;  Laterality: Right;  . Vasectomy  1979   History reviewed. No pertinent family history. Social History  Substance Use Topics  . Smoking status: Never Smoker   . Smokeless tobacco: Never Used  . Alcohol Use: No    Review of Systems  Constitutional: Positive for fever. Negative for chills.  HENT: Negative for congestion and facial swelling.   Eyes: Negative for discharge and visual disturbance.  Respiratory: Negative for shortness of breath.   Cardiovascular: Negative for chest pain and palpitations.  Gastrointestinal: Negative for vomiting, abdominal pain and diarrhea.  Musculoskeletal: Positive for myalgias, back pain and arthralgias.  Skin: Negative for color change and rash.  Neurological: Negative for tremors, syncope and headaches.  Psychiatric/Behavioral: Negative for confusion and dysphoric mood.      Allergies  Adhesive and Phenobarbital  Home Medications   Prior to Admission medications   Medication Sig Start Date End Date Taking? Authorizing Provider  acetaminophen (TYLENOL) 500 MG tablet Take 1,000 mg by mouth every 6 (six) hours as needed for mild pain.  For pain   Yes Historical Provider, MD  ALPRAZolam (XANAX) 0.25 MG tablet Take 0.25 mg by mouth at bedtime.   Yes Historical Provider, MD  amLODipine (NORVASC) 2.5 MG tablet Take 2.5 mg by mouth daily.   Yes Historical Provider, MD  Calcium Carbonate-Vitamin D (CALCIUM-VITAMIN D) 500-200 MG-UNIT per tablet Take 1 tablet by mouth daily.    Yes Historical Provider, MD  Cholecalciferol (VITAMIN D3) 1000 UNITS CAPS Take 1,000 Units by mouth daily.    Yes Historical Provider, MD  digoxin  (LANOXIN) 0.125 MG tablet Take 0.125 mg by mouth daily.   Yes Historical Provider, MD  fludrocortisone (FLORINEF) 0.1 MG tablet Take 0.1 mg by mouth daily.   Yes Historical Provider, MD  folic acid (FOLVITE) A999333 MCG tablet Take 400 mcg by mouth daily.   Yes Historical Provider, MD  HYDROcodone-acetaminophen (NORCO) 5-325 MG tablet Take 1 tablet by mouth every 6 (six) hours as needed for moderate pain or severe pain. 08/10/15  Yes Forde Dandy, MD  hydrocortisone (CORTEF) 20 MG tablet Take 20 mg by mouth daily.    Yes Historical Provider, MD  hydroxychloroquine (PLAQUENIL) 200 MG tablet Take 200 mg by mouth daily.   Yes Historical Provider, MD  linaclotide (LINZESS) 145 MCG CAPS capsule Take 145 mcg by mouth daily as needed (constipation).   Yes Historical Provider, MD  Multiple Vitamin (MULTIVITAMIN WITH MINERALS) TABS Take 1 tablet by mouth daily.   Yes Historical Provider, MD  pantoprazole (PROTONIX) 40 MG tablet Take 40 mg by mouth daily.   Yes Historical Provider, MD  pravastatin (PRAVACHOL) 20 MG tablet Take 20 mg by mouth daily.   Yes Historical Provider, MD  Propylene Glycol-Glycerin (SOOTHE) 0.6-0.6 % SOLN Place 1 drop into both eyes daily as needed (dryness).   Yes Historical Provider, MD  telmisartan (MICARDIS) 80 MG tablet Take 40 mg by mouth daily.   Yes Historical Provider, MD  traMADol (ULTRAM) 50 MG tablet Take 50 mg by mouth every 6 (six) hours as needed for moderate pain.   Yes Historical Provider, MD  diazepam (VALIUM) 5 MG tablet Take 0.5 tablets (2.5 mg total) by mouth every 6 (six) hours as needed for muscle spasms. 08/11/15   Daleen Bo, MD  oxyCODONE-acetaminophen (PERCOCET) 5-325 MG tablet Take 1 tablet by mouth every 4 (four) hours as needed for severe pain. 08/11/15   Daleen Bo, MD  zoledronic acid (RECLAST) 5 MG/100ML SOLN injection Inject 5 mg into the vein See admin instructions. Once a year    Historical Provider, MD   BP 153/73 mmHg  Pulse 104  Temp(Src) 100.3 F  (37.9 C) (Oral)  Resp 24  Ht 5\' 3"  (1.6 m)  Wt 130 lb (58.968 kg)  BMI 23.03 kg/m2  SpO2 92% Physical Exam  Constitutional: He is oriented to person, place, and time. He appears well-developed and well-nourished.  HENT:  Head: Normocephalic and atraumatic.  Eyes: EOM are normal. Pupils are equal, round, and reactive to light.  Neck: Normal range of motion. Neck supple. No JVD present.  Cardiovascular: Normal rate and regular rhythm.  Exam reveals no gallop and no friction rub.   No murmur heard. Pulmonary/Chest: No respiratory distress. He has no wheezes.  Abdominal: He exhibits no distension. There is no rebound and no guarding.  Musculoskeletal: Normal range of motion. He exhibits tenderness.  Significant tenderness about the disc spaces in between the lower thoracic and upper L-spine. Mild right flank pain.  Neurological: He is alert and oriented to  person, place, and time.  Reflex Scores:      Patellar reflexes are 2+ on the right side and 2+ on the left side.      Achilles reflexes are 2+ on the right side and 2+ on the left side. No clonus  Skin: No rash noted. No pallor.  Psychiatric: He has a normal mood and affect. His behavior is normal.  Nursing note and vitals reviewed.   ED Course  Procedures (including critical care time) Labs Review Labs Reviewed  CBC WITH DIFFERENTIAL/PLATELET - Abnormal; Notable for the following:    WBC 12.3 (*)    HCT 38.5 (*)    Neutro Abs 10.7 (*)    Lymphs Abs 0.5 (*)    All other components within normal limits  BASIC METABOLIC PANEL - Abnormal; Notable for the following:    Potassium 3.2 (*)    Glucose, Bld 134 (*)    Calcium 8.6 (*)    All other components within normal limits  SEDIMENTATION RATE - Abnormal; Notable for the following:    Sed Rate 34 (*)    All other components within normal limits  C-REACTIVE PROTEIN - Abnormal; Notable for the following:    CRP 17.5 (*)    All other components within normal limits  HEPATIC  FUNCTION PANEL - Abnormal; Notable for the following:    Total Protein 6.1 (*)    Albumin 2.9 (*)    ALT 14 (*)    Total Bilirubin 1.5 (*)    Indirect Bilirubin 1.0 (*)    All other components within normal limits  URINALYSIS, ROUTINE W REFLEX MICROSCOPIC (NOT AT [facility])  LIPASE, BLOOD  I-STAT CG4 LACTIC ACID, ED    Imaging Review Mr Thoracic Spine Wo Contrast  08/10/2015  CLINICAL DATA:  70 year old male with right flank, abdominal pain, midline spine pain and fever. Initial encounter. EXAM: MRI THORACIC SPINE WITHOUT CONTRAST TECHNIQUE: Multiplanar, multisequence MR imaging of the thoracic spine was performed. No intravenous contrast was administered. COMPARISON:  Chest radiographs 02/02/2012. CT Abdomen and Pelvis 08/09/2015. FINDINGS: Limited sagittal imaging of the cervical spine remarkable for chronic disc and endplate degeneration. No high-grade cervical spinal stenosis. Widespread chronic thoracic compression fractures, predominantly mild from the T2 to the T8 level. T6 and T8 compression is moderate. No associated marrow edema. Lower thoracic levels appear to remain intact. No thoracic spondylolisthesis. No significant thoracic spinal stenosis. Spinal cord signal is within normal limits at all visualized levels. Conus medullaris appears normal at L1-L2. Abnormal dependent signal in both lungs, mildly increased since yesterday. Trace associated right pleural effusion. Cholelithiasis. Stable visualized upper abdominal viscera. Negative visualized posterior paraspinal soft tissues. IMPRESSION: 1. No acute findings or significant spinal stenosis in the thoracic spine. Multilevel chronic thoracic spine compression fractures. 2. Bilateral pulmonary atelectasis versus pneumonia. Trace right pleural effusion. Electronically Signed   By: Genevie Ann M.D.   On: 08/10/2015 21:43   Mr Lumbar Spine Wo Contrast  08/10/2015  CLINICAL DATA:  70 year old male with right flank, abdominal pain, midline spine pain  and fever. Initial encounter. EXAM: MRI LUMBAR SPINE WITHOUT CONTRAST TECHNIQUE: Multiplanar, multisequence MR imaging of the lumbar spine was performed. No intravenous contrast was administered. COMPARISON:  Thoracic MRI from today reported separately. CT Abdomen and Pelvis 08/09/2015. FINDINGS: Normal thoracic and lumbar spine segmentation. No lumbar compression fracture. No marrow edema or evidence of acute osseous abnormality. Visible sacrum intact. Visualized lower thoracic spinal cord is normal with conus medularis at L2. Stable visualized abdominal viscera.  Negative visualized posterior paraspinal soft tissues. Mild for age lumbar spine degeneration above L4-L5, without stenosis at those levels. L4-L5: Disc desiccation and disc space loss with right eccentric circumferential disc osteophyte complex. Superimposed severe ligament flavum and moderate facet hypertrophy. Right facet joint fluid. Severe spinal and right greater than left lateral recess stenosis. Moderate right greater than left L4 foraminal stenosis. L5-S1: Comparatively mild disc osteophyte degeneration. Mild to moderate facet hypertrophy. No spinal or lateral recess stenosis. Mild to moderate L5 foraminal stenosis, greater on the right. IMPRESSION: 1. Severe degenerative spinal and right lateral recess stenosis at L4-L5. Moderate right greater than left L4 and L5 nerve level foraminal stenosis. 2. No acute osseous abnormality in the lumbar spine. Minimal lower thoracic and lumbar degeneration otherwise. Electronically Signed   By: Genevie Ann M.D.   On: 08/10/2015 21:50   Ct Abdomen Pelvis W Contrast  08/11/2015  CLINICAL DATA:  Increasing fever and back pain, seen here yesterday for same symptoms. EXAM: CT ABDOMEN AND PELVIS WITH CONTRAST TECHNIQUE: Multidetector CT imaging of the abdomen and pelvis was performed using the standard protocol following bolus administration of intravenous contrast. CONTRAST:  152mL ISOVUE-300 IOPAMIDOL (ISOVUE-300)  INJECTION 61% COMPARISON:  MRI of the thoracic and lumbar spine July 31, 2015 and CT abdomen and pelvis August 09, 2015 FINDINGS: LUNG BASES: Pleural thickening and enhancing atelectasis. LEFT lower lobe calcified granuloma. 3 mm RIGHT middle lobe pulmonary nodule. SOLID ORGANS: The liver demonstrates subcentimeter probable, unchanged. Dependent gallbladder sludge and punctate cholelithiasis without CT findings of acute cholecystitis. Spleen, gallbladder, pancreas and adrenal glands are unremarkable. GASTROINTESTINAL TRACT: The stomach, small and large bowel are normal in course and caliber without inflammatory changes. Severe descending and sigmoid diverticulosis. Gas and stool distended proximal colon. The appendix is not discretely identified, however there are no inflammatory changes in the right lower quadrant. KIDNEYS/ URINARY TRACT: Kidneys are orthotopic, demonstrating symmetric enhancement. No nephrolithiasis, hydronephrosis or solid renal masses. The unopacified ureters are normal in course and caliber. Delayed imaging through the kidneys demonstrates symmetric prompt contrast excretion within the proximal urinary collecting system. Urinary bladder is well distended with contrast opacification. PERITONEUM/RETROPERITONEUM: Aortoiliac vessels are normal in course and caliber, moderate calcific atherosclerosis. No lymphadenopathy by CT size criteria. Internal reproductive organs are unremarkable. No intraperitoneal free fluid nor free air. SOFT TISSUE/OSSEOUS STRUCTURES: Non-suspicious. Streak artifact from bilateral hip arthroplasties. Asymmetric severe RIGHT iliopsoas muscle atrophy. Osteopenia. Stable moderate T8 compression fracture. IMPRESSION: No acute intra-abdominal or pelvic process. Cholelithiasis and severe colonic diverticulosis. Stable old moderate T8 compression fracture. 3 mm RIGHT middle lobe pulmonary nodule. No follow-up needed if patient is low-risk. Non-contrast chest CT can be considered  in 12 months if patient is high-risk. This recommendation follows the consensus statement: Guidelines for Management of Incidental Pulmonary Nodules Detected on CT Images:From the Fleischner Society 2017; published online before print (10.1148/radiol.IJ:2314499). Electronically Signed   By: Elon Alas M.D.   On: 08/11/2015 06:38   Ct Abdomen Pelvis W Contrast  08/09/2015  CLINICAL DATA:  Left flank pain since Saturday.  Nausea and fever. EXAM: CT ABDOMEN AND PELVIS WITH CONTRAST TECHNIQUE: Multidetector CT imaging of the abdomen and pelvis was performed using the standard protocol following bolus administration of intravenous contrast. CONTRAST:  1 ISOVUE-300 IOPAMIDOL (ISOVUE-300) INJECTION 61% COMPARISON:  06/15/2015 FINDINGS: Atelectasis in the lung bases.  Small esophageal hiatal hernia. Sub cm low-attenuation lesion in the dome of the liver is too small to characterize but probably represents a small cyst. No change  since previous study. Cholelithiasis with small stones in the gallbladder. No inflammatory infiltration. No bile duct dilatation. The pancreas, spleen, adrenal glands, kidneys, abdominal aorta, inferior vena cava, and retroperitoneal lymph nodes are unremarkable. Stomach, small bowel, and colon are not abnormally distended. Scattered stool and fluid in the colon. No free air or free fluid in the abdomen. Pelvis: Visualization limited due streak artifact from bilateral hip prostheses. Prostate gland appears enlarged. Bladder wall is not thickened. No pelvic mass or lymphadenopathy. Appendix is not identified but no inflammatory changes are suggested. Scattered diverticula in the colon without evidence of diverticulitis. Degenerative changes in the spine. No destructive bone lesions. IMPRESSION: Cholelithiasis.  No acute abnormalities demonstrated. Electronically Signed   By: Lucienne Capers M.D.   On: 08/09/2015 23:04   US Abdomen Limited Ruq  08/09/2015  CLINICAL DATA:  Right upper  quadrant pain with nausea for 3 days EXAM: US ABDOMEN LIMITED - RIGHT UPPER QUADRANT COMPARISON:  CT abdomen and pelvis June 15, 2015 FINDINGS: Gallbladder: Within the gallbladder, there are echogenic foci which move and shadow consistent with gallstones. Largest gallstone measures 8 mm in length. There is no gallbladder wall thickening or pericholecystic fluid. No sonographic Murphy sign noted by sonographer. Common bile duct: Diameter: 4 mm. No intrahepatic or extrahepatic biliary duct dilatation. Liver: No focal lesion identified. Within normal limits in parenchymal echogenicity. IMPRESSION: Cholelithiasis.  Study otherwise unremarkable. Electronically Signed   By: Lowella Grip III M.D.   On: 08/09/2015 21:08   I have personally reviewed and evaluated these images and lab results as part of my medical decision-making.   EKG Interpretation None      MDM   Final diagnoses:  Midline low back pain without sciatica  Fever, unspecified fever cause  Spondylosis of thoracolumbar region without myelopathy or radiculopathy  Thoracic compression fracture, closed, initial encounter [facility])    70 yo M with back pain and fever. Patient has no symptoms of weakness or numbness to bilateral lower extremities. Reflexes are intact there is no clonus. Rectal exam with no noted decreased sensation or decreased tone. Will obtain an MRI of the T and L-spine.  Turned over to Dr. Eulis Foster.  The patients results and plan were reviewed and discussed.   Any x-rays performed were independently reviewed by myself.   Differential diagnosis were considered with the presenting HPI.  Medications  0.9 %  sodium chloride infusion (1,000 mLs Intravenous New Bag/Given 08/11/15 0415)    Followed by  0.9 %  sodium chloride infusion (1,000 mLs Intravenous New Bag/Given 08/11/15 0528)  vancomycin (VANCOCIN) IVPB 1000 mg/200 mL premix (not administered)  diazepam (VALIUM) tablet 5 mg (5 mg Oral Given 08/10/15 1602)   morphine 2 MG/ML injection 2 mg (2 mg Intravenous Given 08/10/15 1601)  potassium chloride SA (K-DUR,KLOR-CON) CR tablet 40 mEq (40 mEq Oral Given 08/10/15 1654)  morphine 2 MG/ML injection 2 mg (2 mg Intravenous Given 08/10/15 2008)  acetaminophen (TYLENOL) tablet 650 mg (650 mg Oral Given 08/10/15 2007)  oxyCODONE-acetaminophen (PERCOCET/ROXICET) 5-325 MG per tablet 1 tablet (1 tablet Oral Given 08/11/15 0134)  morphine 4 MG/ML injection 8 mg (8 mg Intravenous Given 08/11/15 0335)  ondansetron (ZOFRAN) injection 4 mg (4 mg Intravenous Given 08/11/15 0336)  piperacillin-tazobactam (ZOSYN) IVPB 3.375 g (0 g Intravenous Stopped 08/11/15 0450)  iopamidol (ISOVUE-300) 61 % injection (100 mLs  Contrast Given 08/11/15 0549)  morphine 4 MG/ML injection 4 mg (4 mg Intravenous Given 08/11/15 0641)    Filed Vitals:   08/11/15  [id number] 08/11/15 0352 08/11/15 0430 08/11/15 0613  BP: 139/65 126/56  153/73  Pulse: 99 92  104  Temp:  100.1 F (37.8 C) 100.3 F (37.9 C) 100.3 F (37.9 C)  TempSrc:  Oral Oral Oral  Resp: 25 24  24   Height:      Weight:      SpO2: 91% 94%  92%    Final diagnoses:  Midline low back pain without sciatica  Fever, unspecified fever cause  Spondylosis of thoracolumbar region without myelopathy or radiculopathy  Thoracic compression fracture, closed, initial encounter [facility])       Deno Etienne, DO 08/11/15 [id number]

## 2015-08-11 ENCOUNTER — Emergency Department (HOSPITAL_COMMUNITY): Payer: Medicare Other

## 2015-08-11 ENCOUNTER — Encounter (HOSPITAL_COMMUNITY): Payer: Self-pay | Admitting: Radiology

## 2015-08-11 ENCOUNTER — Inpatient Hospital Stay (HOSPITAL_COMMUNITY): Payer: Medicare Other

## 2015-08-11 DIAGNOSIS — Z96649 Presence of unspecified artificial hip joint: Secondary | ICD-10-CM | POA: Diagnosis present

## 2015-08-11 DIAGNOSIS — K8000 Calculus of gallbladder with acute cholecystitis without obstruction: Secondary | ICD-10-CM | POA: Diagnosis not present

## 2015-08-11 DIAGNOSIS — M069 Rheumatoid arthritis, unspecified: Secondary | ICD-10-CM | POA: Diagnosis not present

## 2015-08-11 DIAGNOSIS — K5790 Diverticulosis of intestine, part unspecified, without perforation or abscess without bleeding: Secondary | ICD-10-CM | POA: Diagnosis present

## 2015-08-11 DIAGNOSIS — Z79891 Long term (current) use of opiate analgesic: Secondary | ICD-10-CM | POA: Diagnosis not present

## 2015-08-11 DIAGNOSIS — E876 Hypokalemia: Secondary | ICD-10-CM | POA: Diagnosis not present

## 2015-08-11 DIAGNOSIS — B359 Dermatophytosis, unspecified: Secondary | ICD-10-CM | POA: Diagnosis present

## 2015-08-11 DIAGNOSIS — K5900 Constipation, unspecified: Secondary | ICD-10-CM | POA: Diagnosis present

## 2015-08-11 DIAGNOSIS — Z7901 Long term (current) use of anticoagulants: Secondary | ICD-10-CM | POA: Diagnosis not present

## 2015-08-11 DIAGNOSIS — I10 Essential (primary) hypertension: Secondary | ICD-10-CM | POA: Diagnosis not present

## 2015-08-11 DIAGNOSIS — K8080 Other cholelithiasis without obstruction: Secondary | ICD-10-CM | POA: Diagnosis present

## 2015-08-11 DIAGNOSIS — R109 Unspecified abdominal pain: Secondary | ICD-10-CM | POA: Diagnosis present

## 2015-08-11 DIAGNOSIS — Z96659 Presence of unspecified artificial knee joint: Secondary | ICD-10-CM | POA: Diagnosis present

## 2015-08-11 DIAGNOSIS — M545 Low back pain, unspecified: Secondary | ICD-10-CM | POA: Insufficient documentation

## 2015-08-11 DIAGNOSIS — K819 Cholecystitis, unspecified: Secondary | ICD-10-CM | POA: Diagnosis not present

## 2015-08-11 DIAGNOSIS — K8020 Calculus of gallbladder without cholecystitis without obstruction: Secondary | ICD-10-CM | POA: Diagnosis not present

## 2015-08-11 DIAGNOSIS — K811 Chronic cholecystitis: Secondary | ICD-10-CM | POA: Diagnosis not present

## 2015-08-11 DIAGNOSIS — R509 Fever, unspecified: Secondary | ICD-10-CM | POA: Insufficient documentation

## 2015-08-11 DIAGNOSIS — K5730 Diverticulosis of large intestine without perforation or abscess without bleeding: Secondary | ICD-10-CM | POA: Diagnosis not present

## 2015-08-11 DIAGNOSIS — R1084 Generalized abdominal pain: Secondary | ICD-10-CM | POA: Diagnosis not present

## 2015-08-11 DIAGNOSIS — E271 Primary adrenocortical insufficiency: Secondary | ICD-10-CM | POA: Diagnosis not present

## 2015-08-11 DIAGNOSIS — E860 Dehydration: Secondary | ICD-10-CM | POA: Diagnosis present

## 2015-08-11 DIAGNOSIS — M4806 Spinal stenosis, lumbar region: Secondary | ICD-10-CM | POA: Diagnosis present

## 2015-08-11 DIAGNOSIS — R911 Solitary pulmonary nodule: Secondary | ICD-10-CM | POA: Diagnosis present

## 2015-08-11 DIAGNOSIS — Z7952 Long term (current) use of systemic steroids: Secondary | ICD-10-CM | POA: Diagnosis not present

## 2015-08-11 DIAGNOSIS — Z952 Presence of prosthetic heart valve: Secondary | ICD-10-CM | POA: Diagnosis not present

## 2015-08-11 DIAGNOSIS — M810 Age-related osteoporosis without current pathological fracture: Secondary | ICD-10-CM | POA: Diagnosis present

## 2015-08-11 DIAGNOSIS — Z79899 Other long term (current) drug therapy: Secondary | ICD-10-CM | POA: Diagnosis not present

## 2015-08-11 DIAGNOSIS — E785 Hyperlipidemia, unspecified: Secondary | ICD-10-CM | POA: Diagnosis present

## 2015-08-11 DIAGNOSIS — K219 Gastro-esophageal reflux disease without esophagitis: Secondary | ICD-10-CM | POA: Diagnosis not present

## 2015-08-11 DIAGNOSIS — N400 Enlarged prostate without lower urinary tract symptoms: Secondary | ICD-10-CM | POA: Diagnosis present

## 2015-08-11 DIAGNOSIS — M4800 Spinal stenosis, site unspecified: Secondary | ICD-10-CM | POA: Diagnosis present

## 2015-08-11 DIAGNOSIS — R1011 Right upper quadrant pain: Secondary | ICD-10-CM | POA: Diagnosis not present

## 2015-08-11 DIAGNOSIS — Z96643 Presence of artificial hip joint, bilateral: Secondary | ICD-10-CM | POA: Diagnosis present

## 2015-08-11 DIAGNOSIS — Z888 Allergy status to other drugs, medicaments and biological substances status: Secondary | ICD-10-CM | POA: Diagnosis not present

## 2015-08-11 DIAGNOSIS — M47894 Other spondylosis, thoracic region: Secondary | ICD-10-CM | POA: Diagnosis present

## 2015-08-11 LAB — PROTIME-INR
INR: 1.15 (ref 0.00–1.49)
Prothrombin Time: 14.9 seconds (ref 11.6–15.2)

## 2015-08-11 LAB — HEPATIC FUNCTION PANEL
ALT: 14 U/L — AB (ref 17–63)
AST: 18 U/L (ref 15–41)
Albumin: 2.9 g/dL — ABNORMAL LOW (ref 3.5–5.0)
Alkaline Phosphatase: 54 U/L (ref 38–126)
BILIRUBIN DIRECT: 0.5 mg/dL (ref 0.1–0.5)
BILIRUBIN INDIRECT: 1 mg/dL — AB (ref 0.3–0.9)
TOTAL PROTEIN: 6.1 g/dL — AB (ref 6.5–8.1)
Total Bilirubin: 1.5 mg/dL — ABNORMAL HIGH (ref 0.3–1.2)

## 2015-08-11 LAB — APTT: aPTT: 31 seconds (ref 24–37)

## 2015-08-11 LAB — LIPASE, BLOOD: Lipase: 22 U/L (ref 11–51)

## 2015-08-11 LAB — MAGNESIUM: MAGNESIUM: 1.7 mg/dL (ref 1.7–2.4)

## 2015-08-11 MED ORDER — DIAZEPAM 5 MG PO TABS: 2.5000 mg | ORAL_TABLET | Freq: Four times a day (QID) | ORAL | Status: DC | PRN

## 2015-08-11 MED ORDER — PANTOPRAZOLE SODIUM 40 MG IV SOLR
40.0000 mg | INTRAVENOUS | Status: DC
  Administered 2015-08-11: 40 mg via INTRAVENOUS
  Filled 2015-08-11: qty 40

## 2015-08-11 MED ORDER — ACETAMINOPHEN 650 MG RE SUPP: 650.0000 mg | Freq: Four times a day (QID) | RECTAL | Status: DC | PRN

## 2015-08-11 MED ORDER — IOPAMIDOL (ISOVUE-300) INJECTION 61%
INTRAVENOUS | Status: AC
Start: 2015-08-11 — End: 2015-08-11
  Administered 2015-08-11: 100 mL
  Filled 2015-08-11: qty 100

## 2015-08-11 MED ORDER — MORPHINE SULFATE (PF) 4 MG/ML IV SOLN
2.4000 mg | Freq: Once | INTRAVENOUS | Status: AC
  Administered 2015-08-11: 2.4 mg via INTRAVENOUS

## 2015-08-11 MED ORDER — PIPERACILLIN-TAZOBACTAM 3.375 G IVPB 30 MIN
3.3750 g | Freq: Once | INTRAVENOUS | Status: AC
  Administered 2015-08-11: 3.375 g via INTRAVENOUS
  Filled 2015-08-11: qty 50

## 2015-08-11 MED ORDER — ONDANSETRON HCL 4 MG/2ML IJ SOLN: 4.0000 mg | Freq: Four times a day (QID) | INTRAMUSCULAR | Status: DC | PRN

## 2015-08-11 MED ORDER — POTASSIUM CHLORIDE IN NACL 20-0.9 MEQ/L-% IV SOLN
INTRAVENOUS | Status: DC
  Administered 2015-08-11 – 2015-08-12 (×2): via INTRAVENOUS
  Filled 2015-08-11 (×2): qty 1000

## 2015-08-11 MED ORDER — DIGOXIN 125 MCG PO TABS
0.1250 mg | ORAL_TABLET | Freq: Every day | ORAL | Status: DC
  Administered 2015-08-13: 0.125 mg via ORAL
  Filled 2015-08-11 (×2): qty 1

## 2015-08-11 MED ORDER — OXYCODONE-ACETAMINOPHEN 5-325 MG PO TABS
1.0000 | ORAL_TABLET | Freq: Once | ORAL | Status: AC
  Administered 2015-08-11: 1 via ORAL
  Filled 2015-08-11: qty 1

## 2015-08-11 MED ORDER — LINACLOTIDE 145 MCG PO CAPS
145.0000 ug | ORAL_CAPSULE | Freq: Every day | ORAL | Status: DC | PRN
  Filled 2015-08-11: qty 1

## 2015-08-11 MED ORDER — AMLODIPINE BESYLATE 2.5 MG PO TABS
2.5000 mg | ORAL_TABLET | Freq: Every day | ORAL | Status: DC
  Administered 2015-08-13: 2.5 mg via ORAL
  Filled 2015-08-11: qty 1

## 2015-08-11 MED ORDER — BISACODYL 10 MG RE SUPP: 10.0000 mg | Freq: Every day | RECTAL | Status: DC | PRN

## 2015-08-11 MED ORDER — ONDANSETRON HCL 4 MG PO TABS: 4.0000 mg | ORAL_TABLET | Freq: Four times a day (QID) | ORAL | Status: DC | PRN

## 2015-08-11 MED ORDER — MORPHINE SULFATE (PF) 2 MG/ML IV SOLN
2.0000 mg | INTRAVENOUS | Status: DC | PRN
  Administered 2015-08-11 – 2015-08-12 (×3): 2 mg via INTRAVENOUS
  Filled 2015-08-11 (×3): qty 1

## 2015-08-11 MED ORDER — VANCOMYCIN HCL IN DEXTROSE 1-5 GM/200ML-% IV SOLN
1000.0000 mg | Freq: Once | INTRAVENOUS | Status: AC
  Administered 2015-08-11: 1000 mg via INTRAVENOUS
  Filled 2015-08-11: qty 200

## 2015-08-11 MED ORDER — FLUDROCORTISONE ACETATE 0.1 MG PO TABS
0.1000 mg | ORAL_TABLET | Freq: Every day | ORAL | Status: DC
  Administered 2015-08-13: 0.1 mg via ORAL
  Filled 2015-08-11 (×2): qty 1

## 2015-08-11 MED ORDER — PIPERACILLIN-TAZOBACTAM 3.375 G IVPB
3.3750 g | Freq: Three times a day (TID) | INTRAVENOUS | Status: DC
  Administered 2015-08-11 – 2015-08-13 (×7): 3.375 g via INTRAVENOUS
  Filled 2015-08-11 (×9): qty 50

## 2015-08-11 MED ORDER — MORPHINE SULFATE (PF) 4 MG/ML IV SOLN
INTRAVENOUS | Status: AC
  Filled 2015-08-11: qty 1

## 2015-08-11 MED ORDER — SODIUM CHLORIDE 0.9 % IV SOLN
1000.0000 mL | INTRAVENOUS | Status: DC
  Administered 2015-08-11: 1000 mL via INTRAVENOUS

## 2015-08-11 MED ORDER — OXYCODONE-ACETAMINOPHEN 5-325 MG PO TABS: 1.0000 | ORAL_TABLET | ORAL | Status: DC | PRN

## 2015-08-11 MED ORDER — KETOROLAC TROMETHAMINE 30 MG/ML IJ SOLN
30.0000 mg | Freq: Once | INTRAMUSCULAR | Status: AC
  Administered 2015-08-11: 30 mg via INTRAVENOUS
  Filled 2015-08-11: qty 1

## 2015-08-11 MED ORDER — SODIUM CHLORIDE 0.9 % IV SOLN
1000.0000 mL | Freq: Once | INTRAVENOUS | Status: AC
  Administered 2015-08-11: 1000 mL via INTRAVENOUS

## 2015-08-11 MED ORDER — HYDROCORTISONE NA SUCCINATE PF 100 MG IJ SOLR
50.0000 mg | Freq: Three times a day (TID) | INTRAMUSCULAR | Status: DC
  Administered 2015-08-11 – 2015-08-12 (×3): 50 mg via INTRAVENOUS
  Filled 2015-08-11 (×3): qty 2

## 2015-08-11 MED ORDER — ONDANSETRON HCL 4 MG/2ML IJ SOLN
4.0000 mg | Freq: Once | INTRAMUSCULAR | Status: AC
  Administered 2015-08-11: 4 mg via INTRAVENOUS
  Filled 2015-08-11: qty 2

## 2015-08-11 MED ORDER — MORPHINE SULFATE (PF) 4 MG/ML IV SOLN
8.0000 mg | Freq: Once | INTRAVENOUS | Status: AC
  Administered 2015-08-11: 8 mg via INTRAVENOUS
  Filled 2015-08-11: qty 2

## 2015-08-11 MED ORDER — HYDROXYCHLOROQUINE SULFATE 200 MG PO TABS
200.0000 mg | ORAL_TABLET | Freq: Every day | ORAL | Status: DC
  Administered 2015-08-13: 200 mg via ORAL
  Filled 2015-08-11 (×2): qty 1

## 2015-08-11 MED ORDER — HYDROCORTISONE SOD SUCCINATE 100 MG PF FOR IT USE: 50.0000 mg | Freq: Three times a day (TID) | INTRAMUSCULAR | Status: DC

## 2015-08-11 MED ORDER — ACETAMINOPHEN 325 MG PO TABS: 650.0000 mg | ORAL_TABLET | Freq: Four times a day (QID) | ORAL | Status: DC | PRN

## 2015-08-11 MED ORDER — MORPHINE SULFATE (PF) 4 MG/ML IV SOLN
4.0000 mg | Freq: Once | INTRAVENOUS | Status: AC
  Administered 2015-08-11: 4 mg via INTRAVENOUS
  Filled 2015-08-11: qty 1

## 2015-08-11 MED ORDER — TECHNETIUM TC 99M MEBROFENIN IV KIT
5.2000 | PACK | Freq: Once | INTRAVENOUS | Status: AC | PRN
  Administered 2015-08-11: 5 via INTRAVENOUS

## 2015-08-11 NOTE — ED Notes (Signed)
MD at bedside. 

## 2015-08-11 NOTE — ED Provider Notes (Signed)
6:49 AM Clinically I think this is cholecystitis given his fever and his right upper quadrant pain and right flank pain.  CT scan without acute intra-abdominal pathology at this time but I will still have general surgery, to evaluate the patient has clinically this appears to be cholecystitis.  I do not have an alternative explanation this time.  I've given him IV Zosyn.  We will admit him to the hospitalist service.  Jola Schmidt, MD 08/11/15 [phone]

## 2015-08-11 NOTE — ED Notes (Signed)
Spoke with Nuc Med, told tech that pt had been kept NPO and last narcotic at 0641. Pt ok to go to scan at 1241.

## 2015-08-11 NOTE — Consult Note (Signed)
Reason for Consult: abdominal pain  Referring Physician: Dr. Jola Schmidt   HPI: John Lyons is a 70 year old male with a history of addison's disease, RA, hypertension, on chronic steroids who presents for the second time in 2 days with abdominal pain.  Pain started suddenly on Saturday morning.  Location is RUQ with radiation to the back.  Severe in severity.  Aggravated by taking in deep breaths.  No alleviating factors.  Associated with nausea, fevers which started on Sunday night, constipation.  Endorses to previous symptoms which at times occurred after oral intake, but mostly random.  Typically would last a few hours and cease.  Initial labs on 4/17 showed normal WBC, normal LFTs, abdominal US and CT significant for stones, but no cholecystitis.  He was discharged after this but presented the next day with ongoing symptoms.  MRI of lumbar and thoracic spine revealed severe stenosis.  A repeat CT scan was essentially negative.  CXR questioned possible pneumonia, but denies sob, cp, cough.  Repeat LFTs showed an elevation in total bilirubin to 1.5, otherwise normal, normal lipase.  WBC is down to 12.3 from 13.3k on the 17th.  Vital signs are stable, however, fevers up to 102.6.  We have been asked to evaluate for ongoing abdominal pain and suspicion for cholecystitis despite negative work up.   Past Medical History  Diagnosis Date  . Addison's disease ([place])   . PONV (postoperative nausea and vomiting)   . Rheumatoid arthritis(714.0)   . Hypertension   . H/O hiatal hernia   . GERD (gastroesophageal reflux disease)   . Dysrhythmia     hx rapid heart rate in 70s, on digoxin  . Osteoporosis     Past Surgical History  Procedure Laterality Date  . Total knee arthroplasty  1984    Left   . Total hip arthroplasty  1979    bilat  . Eye surgery      cataracts  . Cardiovascular stress test  01/04/12    Dr. Gwenlyn Found - [facility]  . Total knee arthroplasty  02/12/2012    right  .  Total knee arthroplasty  02/12/2012    Procedure: TOTAL KNEE ARTHROPLASTY;  Surgeon: Rudean Haskell, MD;  Location: [facility];  Service: Orthopedics;  Laterality: Right;  . Vasectomy  1979    History reviewed. No pertinent family history.  Social History:  reports that he has never smoked. He has never used smokeless tobacco. He reports that he does not drink alcohol or use illicit drugs.  Allergies:  Allergies  Allergen Reactions  . Adhesive [Tape]     Rash    . Phenobarbital Rash    Medications:  Prior to Admission medications   Medication Sig Start Date End Date Taking? Authorizing Provider  acetaminophen (TYLENOL) 500 MG tablet Take 1,000 mg by mouth every 6 (six) hours as needed for mild pain. For pain   Yes Historical Provider, MD  ALPRAZolam (XANAX) 0.25 MG tablet Take 0.25 mg by mouth at bedtime.   Yes Historical Provider, MD  amLODipine (NORVASC) 2.5 MG tablet Take 2.5 mg by mouth daily.   Yes Historical Provider, MD  Calcium Carbonate-Vitamin D (CALCIUM-VITAMIN D) 500-200 MG-UNIT per tablet Take 1 tablet by mouth daily.    Yes Historical Provider, MD  Cholecalciferol (VITAMIN D3) 1000 UNITS CAPS Take 1,000 Units by mouth daily.    Yes Historical Provider, MD  digoxin (LANOXIN) 0.125 MG tablet Take 0.125 mg by mouth daily.   Yes  Historical Provider, MD  fludrocortisone (FLORINEF) 0.1 MG tablet Take 0.1 mg by mouth daily.   Yes Historical Provider, MD  folic acid (FOLVITE) 219 MCG tablet Take 400 mcg by mouth daily.   Yes Historical Provider, MD  HYDROcodone-acetaminophen (NORCO) 5-325 MG tablet Take 1 tablet by mouth every 6 (six) hours as needed for moderate pain or severe pain. 08/10/15  Yes Forde Dandy, MD  hydrocortisone (CORTEF) 20 MG tablet Take 20 mg by mouth daily.    Yes Historical Provider, MD  hydroxychloroquine (PLAQUENIL) 200 MG tablet Take 200 mg by mouth daily.   Yes Historical Provider, MD  linaclotide (LINZESS) 145 MCG CAPS capsule Take 145 mcg by mouth daily as  needed (constipation).   Yes Historical Provider, MD  Multiple Vitamin (MULTIVITAMIN WITH MINERALS) TABS Take 1 tablet by mouth daily.   Yes Historical Provider, MD  pantoprazole (PROTONIX) 40 MG tablet Take 40 mg by mouth daily.   Yes Historical Provider, MD  pravastatin (PRAVACHOL) 20 MG tablet Take 20 mg by mouth daily.   Yes Historical Provider, MD  Propylene Glycol-Glycerin (SOOTHE) 0.6-0.6 % SOLN Place 1 drop into both eyes daily as needed (dryness).   Yes Historical Provider, MD  telmisartan (MICARDIS) 80 MG tablet Take 40 mg by mouth daily.   Yes Historical Provider, MD  traMADol (ULTRAM) 50 MG tablet Take 50 mg by mouth every 6 (six) hours as needed for moderate pain.   Yes Historical Provider, MD  diazepam (VALIUM) 5 MG tablet Take 0.5 tablets (2.5 mg total) by mouth every 6 (six) hours as needed for muscle spasms. 08/11/15   Daleen Bo, MD  oxyCODONE-acetaminophen (PERCOCET) 5-325 MG tablet Take 1 tablet by mouth every 4 (four) hours as needed for severe pain. 08/11/15   Daleen Bo, MD  zoledronic acid (RECLAST) 5 MG/100ML SOLN injection Inject 5 mg into the vein See admin instructions. Once a year    Historical Provider, MD     Results for orders placed or performed during the hospital encounter of 08/10/15 (from the past 48 hour(s))  CBC with Differential     Status: Abnormal   Collection Time: 08/10/15 12:19 PM  Result Value Ref Range   WBC 12.3 (H) 4.0 - 10.5 K/uL   RBC 4.41 4.22 - 5.81 MIL/uL   Hemoglobin 13.3 13.0 - 17.0 g/dL   HCT 38.5 (L) 39.0 - 52.0 %   MCV 87.3 78.0 - 100.0 fL   MCH 30.2 26.0 - 34.0 pg   MCHC 34.5 30.0 - 36.0 g/dL   RDW 12.7 11.5 - 15.5 %   Platelets 316 150 - 400 K/uL   Neutrophils Relative % 88 %   Neutro Abs 10.7 (H) 1.7 - 7.7 K/uL   Lymphocytes Relative 4 %   Lymphs Abs 0.5 (L) 0.7 - 4.0 K/uL   Monocytes Relative 8 %   Monocytes Absolute 1.0 0.1 - 1.0 K/uL   Eosinophils Relative 0 %   Eosinophils Absolute 0.0 0.0 - 0.7 K/uL   Basophils  Relative 0 %   Basophils Absolute 0.0 0.0 - 0.1 K/uL  Basic metabolic panel     Status: Abnormal   Collection Time: 08/10/15 12:19 PM  Result Value Ref Range   Sodium 138 135 - 145 mmol/L   Potassium 3.2 (L) 3.5 - 5.1 mmol/L   Chloride 102 101 - 111 mmol/L   CO2 26 22 - 32 mmol/L   Glucose, Bld 134 (H) 65 - 99 mg/dL   BUN 7 6 -  20 mg/dL   Creatinine, Ser 0.65 0.61 - 1.24 mg/dL   Calcium 8.6 (L) 8.9 - 10.3 mg/dL   GFR calc non Af Amer >60 >60 mL/min   GFR calc Af Amer >60 >60 mL/min    Comment: (NOTE) The eGFR has been calculated using the CKD EPI equation. This calculation has not been validated in all clinical situations. eGFR's persistently <60 mL/min signify possible Chronic Kidney Disease.    Anion gap 10 5 - 15  I-Stat CG4 Lactic Acid, ED     Status: None   Collection Time: 08/10/15 12:45 PM  Result Value Ref Range   Lactic Acid, Venous 1.51 0.5 - 2.0 mmol/L  Sedimentation rate     Status: Abnormal   Collection Time: 08/10/15  2:40 PM  Result Value Ref Range   Sed Rate 34 (H) 0 - 16 mm/hr  C-reactive protein     Status: Abnormal   Collection Time: 08/10/15  2:40 PM  Result Value Ref Range   CRP 17.5 (H) <1.0 mg/dL  Urinalysis, Routine w reflex microscopic     Status: None   Collection Time: 08/10/15  4:57 PM  Result Value Ref Range   Color, Urine YELLOW YELLOW   APPearance CLEAR CLEAR   Specific Gravity, Urine 1.016 1.005 - 1.030   pH 5.5 5.0 - 8.0   Glucose, UA NEGATIVE NEGATIVE mg/dL   Hgb urine dipstick NEGATIVE NEGATIVE   Bilirubin Urine NEGATIVE NEGATIVE   Ketones, ur NEGATIVE NEGATIVE mg/dL   Protein, ur NEGATIVE NEGATIVE mg/dL   Nitrite NEGATIVE NEGATIVE   Leukocytes, UA NEGATIVE NEGATIVE    Comment: MICROSCOPIC NOT DONE ON URINES WITH NEGATIVE PROTEIN, BLOOD, LEUKOCYTES, NITRITE, OR GLUCOSE <1000 mg/dL.  Hepatic function panel     Status: Abnormal   Collection Time: 08/11/15  5:16 AM  Result Value Ref Range   Total Protein 6.1 (L) 6.5 - 8.1 g/dL    Albumin 2.9 (L) 3.5 - 5.0 g/dL   AST 18 15 - 41 U/L   ALT 14 (L) 17 - 63 U/L   Alkaline Phosphatase 54 38 - 126 U/L   Total Bilirubin 1.5 (H) 0.3 - 1.2 mg/dL   Bilirubin, Direct 0.5 0.1 - 0.5 mg/dL   Indirect Bilirubin 1.0 (H) 0.3 - 0.9 mg/dL  Lipase, blood     Status: None   Collection Time: 08/11/15  5:16 AM  Result Value Ref Range   Lipase 22 11 - 51 U/L    Mr Thoracic Spine Wo Contrast  08/10/2015  CLINICAL DATA:  70 year old male with right flank, abdominal pain, midline spine pain and fever. Initial encounter. EXAM: MRI THORACIC SPINE WITHOUT CONTRAST TECHNIQUE: Multiplanar, multisequence MR imaging of the thoracic spine was performed. No intravenous contrast was administered. COMPARISON:  Chest radiographs 02/02/2012. CT Abdomen and Pelvis 08/09/2015. FINDINGS: Limited sagittal imaging of the cervical spine remarkable for chronic disc and endplate degeneration. No high-grade cervical spinal stenosis. Widespread chronic thoracic compression fractures, predominantly mild from the T2 to the T8 level. T6 and T8 compression is moderate. No associated marrow edema. Lower thoracic levels appear to remain intact. No thoracic spondylolisthesis. No significant thoracic spinal stenosis. Spinal cord signal is within normal limits at all visualized levels. Conus medullaris appears normal at L1-L2. Abnormal dependent signal in both lungs, mildly increased since yesterday. Trace associated right pleural effusion. Cholelithiasis. Stable visualized upper abdominal viscera. Negative visualized posterior paraspinal soft tissues. IMPRESSION: 1. No acute findings or significant spinal stenosis in the thoracic spine. Multilevel chronic thoracic  spine compression fractures. 2. Bilateral pulmonary atelectasis versus pneumonia. Trace right pleural effusion. Electronically Signed   By: Genevie Ann M.D.   On: 08/10/2015 21:43   Mr Lumbar Spine Wo Contrast  08/10/2015  CLINICAL DATA:  70 year old male with right flank,  abdominal pain, midline spine pain and fever. Initial encounter. EXAM: MRI LUMBAR SPINE WITHOUT CONTRAST TECHNIQUE: Multiplanar, multisequence MR imaging of the lumbar spine was performed. No intravenous contrast was administered. COMPARISON:  Thoracic MRI from today reported separately. CT Abdomen and Pelvis 08/09/2015. FINDINGS: Normal thoracic and lumbar spine segmentation. No lumbar compression fracture. No marrow edema or evidence of acute osseous abnormality. Visible sacrum intact. Visualized lower thoracic spinal cord is normal with conus medularis at L2. Stable visualized abdominal viscera. Negative visualized posterior paraspinal soft tissues. Mild for age lumbar spine degeneration above L4-L5, without stenosis at those levels. L4-L5: Disc desiccation and disc space loss with right eccentric circumferential disc osteophyte complex. Superimposed severe ligament flavum and moderate facet hypertrophy. Right facet joint fluid. Severe spinal and right greater than left lateral recess stenosis. Moderate right greater than left L4 foraminal stenosis. L5-S1: Comparatively mild disc osteophyte degeneration. Mild to moderate facet hypertrophy. No spinal or lateral recess stenosis. Mild to moderate L5 foraminal stenosis, greater on the right. IMPRESSION: 1. Severe degenerative spinal and right lateral recess stenosis at L4-L5. Moderate right greater than left L4 and L5 nerve level foraminal stenosis. 2. No acute osseous abnormality in the lumbar spine. Minimal lower thoracic and lumbar degeneration otherwise. Electronically Signed   By: Genevie Ann M.D.   On: 08/10/2015 21:50   Ct Abdomen Pelvis W Contrast  08/11/2015  CLINICAL DATA:  Increasing fever and back pain, seen here yesterday for same symptoms. EXAM: CT ABDOMEN AND PELVIS WITH CONTRAST TECHNIQUE: Multidetector CT imaging of the abdomen and pelvis was performed using the standard protocol following bolus administration of intravenous contrast. CONTRAST:  178m  ISOVUE-300 IOPAMIDOL (ISOVUE-300) INJECTION 61% COMPARISON:  MRI of the thoracic and lumbar spine July 31, 2015 and CT abdomen and pelvis August 09, 2015 FINDINGS: LUNG BASES: Pleural thickening and enhancing atelectasis. LEFT lower lobe calcified granuloma. 3 mm RIGHT middle lobe pulmonary nodule. SOLID ORGANS: The liver demonstrates subcentimeter probable, unchanged. Dependent gallbladder sludge and punctate cholelithiasis without CT findings of acute cholecystitis. Spleen, gallbladder, pancreas and adrenal glands are unremarkable. GASTROINTESTINAL TRACT: The stomach, small and large bowel are normal in course and caliber without inflammatory changes. Severe descending and sigmoid diverticulosis. Gas and stool distended proximal colon. The appendix is not discretely identified, however there are no inflammatory changes in the right lower quadrant. KIDNEYS/ URINARY TRACT: Kidneys are orthotopic, demonstrating symmetric enhancement. No nephrolithiasis, hydronephrosis or solid renal masses. The unopacified ureters are normal in course and caliber. Delayed imaging through the kidneys demonstrates symmetric prompt contrast excretion within the proximal urinary collecting system. Urinary bladder is well distended with contrast opacification. PERITONEUM/RETROPERITONEUM: Aortoiliac vessels are normal in course and caliber, moderate calcific atherosclerosis. No lymphadenopathy by CT size criteria. Internal reproductive organs are unremarkable. No intraperitoneal free fluid nor free air. SOFT TISSUE/OSSEOUS STRUCTURES: Non-suspicious. Streak artifact from bilateral hip arthroplasties. Asymmetric severe RIGHT iliopsoas muscle atrophy. Osteopenia. Stable moderate T8 compression fracture. IMPRESSION: No acute intra-abdominal or pelvic process. Cholelithiasis and severe colonic diverticulosis. Stable old moderate T8 compression fracture. 3 mm RIGHT middle lobe pulmonary nodule. No follow-up needed if patient is low-risk.  Non-contrast chest CT can be considered in 12 months if patient is high-risk. This recommendation follows the consensus statement:  Guidelines for Management of Incidental Pulmonary Nodules Detected on CT Images:From the Fleischner Society 2017; published online before print (10.1148/radiol.[id number]). Electronically Signed   By: Elon Alas M.D.   On: 08/11/2015 06:38   Ct Abdomen Pelvis W Contrast  08/09/2015  CLINICAL DATA:  Left flank pain since Saturday.  Nausea and fever. EXAM: CT ABDOMEN AND PELVIS WITH CONTRAST TECHNIQUE: Multidetector CT imaging of the abdomen and pelvis was performed using the standard protocol following bolus administration of intravenous contrast. CONTRAST:  1 ISOVUE-300 IOPAMIDOL (ISOVUE-300) INJECTION 61% COMPARISON:  06/15/2015 FINDINGS: Atelectasis in the lung bases.  Small esophageal hiatal hernia. Sub cm low-attenuation lesion in the dome of the liver is too small to characterize but probably represents a small cyst. No change since previous study. Cholelithiasis with small stones in the gallbladder. No inflammatory infiltration. No bile duct dilatation. The pancreas, spleen, adrenal glands, kidneys, abdominal aorta, inferior vena cava, and retroperitoneal lymph nodes are unremarkable. Stomach, small bowel, and colon are not abnormally distended. Scattered stool and fluid in the colon. No free air or free fluid in the abdomen. Pelvis: Visualization limited due streak artifact from bilateral hip prostheses. Prostate gland appears enlarged. Bladder wall is not thickened. No pelvic mass or lymphadenopathy. Appendix is not identified but no inflammatory changes are suggested. Scattered diverticula in the colon without evidence of diverticulitis. Degenerative changes in the spine. No destructive bone lesions. IMPRESSION: Cholelithiasis.  No acute abnormalities demonstrated. Electronically Signed   By: Lucienne Capers M.D.   On: 08/09/2015 23:04   Dg Chest Portable 1  View  08/11/2015  CLINICAL DATA:  Fever for 3 days EXAM: PORTABLE CHEST 1 VIEW COMPARISON:  February 02, 2012 FINDINGS: There is patchy airspace consolidation in the left base. Lungs elsewhere clear. Heart size and pulmonary vascularity are normal. No adenopathy. There is advanced arthropathy in both shoulders. IMPRESSION: Patchy airspace consolidation left base consistent with a degree of pneumonia. Lungs elsewhere clear. Heart size within normal limits. Followup PA and lateral chest radiographs recommended in 3-4 weeks following trial of antibiotic therapy to ensure resolution and exclude underlying malignancy. Electronically Signed   By: Lowella Grip III M.D.   On: 08/11/2015 07:09   US Abdomen Limited Ruq  08/09/2015  CLINICAL DATA:  Right upper quadrant pain with nausea for 3 days EXAM: US ABDOMEN LIMITED - RIGHT UPPER QUADRANT COMPARISON:  CT abdomen and pelvis June 15, 2015 FINDINGS: Gallbladder: Within the gallbladder, there are echogenic foci which move and shadow consistent with gallstones. Largest gallstone measures 8 mm in length. There is no gallbladder wall thickening or pericholecystic fluid. No sonographic Murphy sign noted by sonographer. Common bile duct: Diameter: 4 mm. No intrahepatic or extrahepatic biliary duct dilatation. Liver: No focal lesion identified. Within normal limits in parenchymal echogenicity. IMPRESSION: Cholelithiasis.  Study otherwise unremarkable. Electronically Signed   By: Lowella Grip III M.D.   On: 08/09/2015 21:08    Review of Systems  Constitutional: Positive for fever, chills and malaise/fatigue.  Eyes: Negative for blurred vision, double vision, photophobia, pain, discharge and redness.  Respiratory: Negative for cough, hemoptysis, sputum production, shortness of breath and wheezing.   Cardiovascular: Positive for leg swelling. Negative for chest pain, palpitations, orthopnea, claudication and PND.  Gastrointestinal: Positive for nausea,  abdominal pain and constipation. Negative for vomiting, diarrhea, blood in stool and melena.  Genitourinary: Negative for dysuria, urgency, frequency, hematuria and flank pain.  Musculoskeletal: Negative for neck pain.  Neurological: Negative for dizziness, tingling, tremors, sensory change, speech change,  focal weakness, seizures, loss of consciousness, weakness and headaches.   Blood pressure 153/73, pulse 104, temperature 100.3 F (37.9 C), temperature source Oral, resp. rate 24, height _0  (1.6 m), weight 58.968 kg (130 lb), SpO2 92 %. Physical Exam  Constitutional: He is oriented to person, place, and time. He appears well-developed and well-nourished. No distress.  Cardiovascular: Normal rate, regular rhythm, normal heart sounds and intact distal pulses.  Exam reveals no gallop and no friction rub.   No murmur heard. Respiratory: Effort normal and breath sounds normal. No respiratory distress. He has no wheezes. He has no rales. He exhibits no tenderness.  GI: Soft. Bowel sounds are normal. He exhibits distension.  +murphy's sign  Musculoskeletal: Normal range of motion. He exhibits no edema.  Neurological: He is alert and oriented to person, place, and time.  Skin: Skin is warm and dry. He is not diaphoretic.  Psychiatric: He has a normal mood and affect. His behavior is normal. Thought content normal.    Assessment/Plan: Fever/Abdominal pain-fevers, WBC 12.3, positive murphy's sign on exam, but [place] and CTx2 negative for pericholecystic fluid, wall thickening/edema.  Will therefore proceed with a HIDA scan to confirm or rule out.  Last dose of narcotics was at 0640.  Hopefully we can get the HIDA today.  ID-would recommend zosyn for possible cholecystitis.  ?pneumonia.  He is asymptomatic, however, on steroids and therefore may present differently.  Hx addison's disease-steroids per primary team FEN-NPO for now, no narcotics until we can obtain a HIDA. Hypokalemia-k supplemented    Erby Pian ANP-BC Pager [phone] 08/11/2015, 9:16 AM

## 2015-08-11 NOTE — Progress Notes (Signed)
Pt admitted to 6N32 via bed from ED.  PT AAO X 4.  Pt on 2L O2 via [place].  Pt has 20G to Rt UA with fluids infusing and 24G to lt hand SL.  Family to bedside.  Will continue to monitor.

## 2015-08-11 NOTE — H&P (Signed)
[facility] History and Physical                                                                                    John Lyons, is a 70 y.o. male  MRN: [id number]   DOB - 29-Apr-1945  Admit Date - 08/10/2015  Outpatient Primary MD for the patient is John Ly, MD  Cardiology: Dr Gwenlyn Found  With History of -  Past Medical History  Diagnosis Date  . Addison's disease ([place])   . PONV (postoperative nausea and vomiting)   . Rheumatoid arthritis(714.0)   . Hypertension   . H/O hiatal hernia   . GERD (gastroesophageal reflux disease)   . Dysrhythmia     hx rapid heart rate in 70s, on digoxin  . Osteoporosis       Past Surgical History  Procedure Laterality Date  . Total knee arthroplasty  1984    Left   . Total hip arthroplasty  1979    bilat  . Eye surgery      cataracts  . Cardiovascular stress test  01/04/12    Dr. Gwenlyn Found - [facility]  . Total knee arthroplasty  02/12/2012    right  . Total knee arthroplasty  02/12/2012    Procedure: TOTAL KNEE ARTHROPLASTY;  Surgeon: Rudean Haskell, MD;  Location: [place];  Service: Orthopedics;  Laterality: Right;  . Vasectomy  1979    in for   Chief Complaint  Patient presents with  . Fever  . Back Pain     HPI  John Lyons  is a 70 y.o. male with pmh of rheumatoid arthritis, addisons disease on chronic steroids, hypertension, hyperlipidemia has presented to the ED twice on 4/17 and 4/18 with complaints of abdominal pain. Pt states symptoms began Saturday with sharp RUQ abdominal pain and nausea prompting visit to ED. He was afebrile at that time with negative CT abd/pelvis and abd ultrasound and felt stable for discharge home. Pt returned to ED on 4/18 with worsening abdominal pain, nausea and abdominal distention. Fever also 102.6 on arrival to ED. Again, pt states symptoms began Saturday, last full meal was on Sunday with subsequent increased abdominal pain and nausea, last BM was Saturday and WNL. Describes  diffuse abdominal pain, but feels like it originates in RUQ radiating to mid back.  Workup thus far reveal max temp 102.6, WBC 12.3, CT abdomen/pelvis done 4/17 and 4/18 showed gallbladder sludge and cholelithiasis (no cholecystitis), severe diverticulosis without diverticulitis. MRI T/L spine done to rule out diskitis showed old thoracic compression fxs and spinal stenosis without any acute findings. Also, incidental finding of 12mm pulmonary nodule in RML.   Pt denies recent cough or sob. States he does not want to take deep breaths due to abdominal pain. No recent headache, dizziness, chest pain, vomiting, diarrhea, melena or hematechezia. Pt to be admitted at this time for further evaluation and treatment.    Review of Systems   In addition to the HPI above,  No Headache, No changes with Vision or hearing, No problems swallowing food or Liquids, No Chest pain, Cough or Shortness of Breath, No Blood in stool or Urine, No  dysuria, No new skin rashes or bruises, No new joints pains-aches,  No new weakness, tingling, numbness in any extremity, No recent weight gain or loss, A full 10 point Review of Systems was done, except as stated above, all other Review of Systems were negative.  Social History Social History  Substance Use Topics  . Smoking status: Never Smoker   . Smokeless tobacco: Never Used  . Alcohol Use: No  Still working full time at [employer]. Married. No hx of tobacco abuse.   Family History History reviewed. No pertinent family history.  Prior to Admission medications   Medication Sig Start Date End Date Taking? Authorizing Provider  acetaminophen (TYLENOL) 500 MG tablet Take 1,000 mg by mouth every 6 (six) hours as needed for mild pain. For pain   Yes Historical Provider, MD  ALPRAZolam (XANAX) 0.25 MG tablet Take 0.25 mg by mouth at bedtime.   Yes Historical Provider, MD  amLODipine (NORVASC) 2.5 MG tablet Take 2.5 mg by mouth daily.   Yes  Historical Provider, MD  Calcium Carbonate-Vitamin D (CALCIUM-VITAMIN D) 500-200 MG-UNIT per tablet Take 1 tablet by mouth daily.    Yes Historical Provider, MD  Cholecalciferol (VITAMIN D3) 1000 UNITS CAPS Take 1,000 Units by mouth daily.    Yes Historical Provider, MD  digoxin (LANOXIN) 0.125 MG tablet Take 0.125 mg by mouth daily.   Yes Historical Provider, MD  fludrocortisone (FLORINEF) 0.1 MG tablet Take 0.1 mg by mouth daily.   Yes Historical Provider, MD  folic acid (FOLVITE) A999333 MCG tablet Take 400 mcg by mouth daily.   Yes Historical Provider, MD  HYDROcodone-acetaminophen (NORCO) 5-325 MG tablet Take 1 tablet by mouth every 6 (six) hours as needed for moderate pain or severe pain. 08/10/15  Yes Forde Dandy, MD  hydrocortisone (CORTEF) 20 MG tablet Take 20 mg by mouth daily.    Yes Historical Provider, MD  hydroxychloroquine (PLAQUENIL) 200 MG tablet Take 200 mg by mouth daily.   Yes Historical Provider, MD  linaclotide (LINZESS) 145 MCG CAPS capsule Take 145 mcg by mouth daily as needed (constipation).   Yes Historical Provider, MD  Multiple Vitamin (MULTIVITAMIN WITH MINERALS) TABS Take 1 tablet by mouth daily.   Yes Historical Provider, MD  pantoprazole (PROTONIX) 40 MG tablet Take 40 mg by mouth daily.   Yes Historical Provider, MD  pravastatin (PRAVACHOL) 20 MG tablet Take 20 mg by mouth daily.   Yes Historical Provider, MD  Propylene Glycol-Glycerin (SOOTHE) 0.6-0.6 % SOLN Place 1 drop into both eyes daily as needed (dryness).   Yes Historical Provider, MD  telmisartan (MICARDIS) 80 MG tablet Take 40 mg by mouth daily.   Yes Historical Provider, MD  traMADol (ULTRAM) 50 MG tablet Take 50 mg by mouth every 6 (six) hours as needed for moderate pain.   Yes Historical Provider, MD  diazepam (VALIUM) 5 MG tablet Take 0.5 tablets (2.5 mg total) by mouth every 6 (six) hours as needed for muscle spasms. 08/11/15   Daleen Bo, MD  oxyCODONE-acetaminophen (PERCOCET) 5-325 MG tablet Take 1  tablet by mouth every 4 (four) hours as needed for severe pain. 08/11/15   Daleen Bo, MD  zoledronic acid (RECLAST) 5 MG/100ML SOLN injection Inject 5 mg into the vein See admin instructions. Once a year    Historical Provider, MD    Allergies  Allergen Reactions  . Adhesive [Tape]     Rash    . Phenobarbital Rash    Physical Exam  Vitals  Blood pressure 153/73, pulse 104, temperature 100.3 F (37.9 C), temperature source Oral, resp. rate 24, height 5\' 3"  (1.6 m), weight 58.968 kg (130 lb), SpO2 92 %.   General:  Sitting upright in bed, appears unwell, but in NAD  Psych:  Normal affect and insight, Not Suicidal or Homicidal, Awake Alert, Oriented X 3.  Neuro:   No F.N deficits, ALL C.Nerves Intact, Strength 5/5 all 4 extremities, Sensation intact all 4 extremities.  ENT:  Ears and Eyes appear Normal, Conjunctivae clear, PER. Moist oral mucosa without erythema or exudates.  Neck:  Supple, No lymphadenopathy appreciated  Respiratory:  Symmetrical chest wall movement, decreased air movement given pain, no w/r/c  Cardiac:  RRR, No Murmurs, no LE edema noted, no JVD.    Abdomen: Soft but distended, +BS, negative Murphy's sign, no rebound tenderness  Skin:  No Cyanosis, Normal Skin Turgor, No Skin Rash or Bruise.  Extremities:  Able to move all 4. 5/5 strength in each,  no effusions.  Data Review  CBC  Recent Labs Lab 08/09/15 1534 08/09/15 1951 08/10/15 1219  WBC 13.3*  --  12.3*  HGB 14.5  --  13.3  HCT 42.5  --  38.5*  PLT 300  --  316  MCV 86.4  --  87.3  MCH 29.5  --  30.2  MCHC 34.1  --  34.5  RDW 12.7  --  12.7  LYMPHSABS  --  0.7 0.5*  MONOABS  --  0.8 1.0  EOSABS  --  0.0 0.0  BASOSABS  --  0.0 0.0    Chemistries   Recent Labs Lab 08/09/15 1534 08/10/15 1219 08/11/15 0516  NA 134* 138  --   K 3.3* 3.2*  --   CL 101 102  --   CO2 21* 26  --   GLUCOSE 123* 134*  --   BUN 10 7  --   CREATININE 0.50* 0.65  --   CALCIUM 9.0 8.6*  --   AST  23  --  18  ALT 22  --  14*  ALKPHOS 65  --  54  BILITOT 0.9  --  1.5*    estimated creatinine clearance is 68.2 mL/min (by C-G formula based on Cr of 0.65).  No results for input(s): TSH, T4TOTAL, T3FREE, THYROIDAB in the last 72 hours.  Invalid input(s): FREET3  Coagulation profile No results for input(s): INR, PROTIME in the last 168 hours.  No results for input(s): DDIMER in the last 72 hours.  Cardiac Enzymes No results for input(s): CKMB, TROPONINI, MYOGLOBIN in the last 168 hours.  Invalid input(s): CK  Invalid input(s): POCBNP  Urinalysis    Component Value Date/Time   COLORURINE YELLOW 08/10/2015 1657   APPEARANCEUR CLEAR 08/10/2015 1657   LABSPEC 1.016 08/10/2015 1657   PHURINE 5.5 08/10/2015 1657   GLUCOSEU NEGATIVE 08/10/2015 1657   HGBUR NEGATIVE 08/10/2015 1657   [place] 08/10/2015 1657   KETONESUR NEGATIVE 08/10/2015 1657   PROTEINUR NEGATIVE 08/10/2015 1657   UROBILINOGEN 0.2 02/02/2012 1135   NITRITE NEGATIVE 08/10/2015 1657   LEUKOCYTESUR NEGATIVE 08/10/2015 1657    Imaging results:   Mr Thoracic Spine Wo Contrast  08/10/2015  CLINICAL DATA:  70 year old male with right flank, abdominal pain, midline spine pain and fever. Initial encounter. EXAM: MRI THORACIC SPINE WITHOUT CONTRAST TECHNIQUE: Multiplanar, multisequence MR imaging of the thoracic spine was performed. No intravenous contrast was administered. COMPARISON:  Chest radiographs 02/02/2012. CT Abdomen and Pelvis 08/09/2015. FINDINGS:  Limited sagittal imaging of the cervical spine remarkable for chronic disc and endplate degeneration. No high-grade cervical spinal stenosis. Widespread chronic thoracic compression fractures, predominantly mild from the T2 to the T8 level. T6 and T8 compression is moderate. No associated marrow edema. Lower thoracic levels appear to remain intact. No thoracic spondylolisthesis. No significant thoracic spinal stenosis. Spinal cord signal is within normal  limits at all visualized levels. Conus medullaris appears normal at L1-L2. Abnormal dependent signal in both lungs, mildly increased since yesterday. Trace associated right pleural effusion. Cholelithiasis. Stable visualized upper abdominal viscera. Negative visualized posterior paraspinal soft tissues. IMPRESSION: 1. No acute findings or significant spinal stenosis in the thoracic spine. Multilevel chronic thoracic spine compression fractures. 2. Bilateral pulmonary atelectasis versus pneumonia. Trace right pleural effusion. Electronically Signed   By: Genevie Ann M.D.   On: 08/10/2015 21:43   Mr Lumbar Spine Wo Contrast  08/10/2015  CLINICAL DATA:  70 year old male with right flank, abdominal pain, midline spine pain and fever. Initial encounter. EXAM: MRI LUMBAR SPINE WITHOUT CONTRAST TECHNIQUE: Multiplanar, multisequence MR imaging of the lumbar spine was performed. No intravenous contrast was administered. COMPARISON:  Thoracic MRI from today reported separately. CT Abdomen and Pelvis 08/09/2015. FINDINGS: Normal thoracic and lumbar spine segmentation. No lumbar compression fracture. No marrow edema or evidence of acute osseous abnormality. Visible sacrum intact. Visualized lower thoracic spinal cord is normal with conus medularis at L2. Stable visualized abdominal viscera. Negative visualized posterior paraspinal soft tissues. Mild for age lumbar spine degeneration above L4-L5, without stenosis at those levels. L4-L5: Disc desiccation and disc space loss with right eccentric circumferential disc osteophyte complex. Superimposed severe ligament flavum and moderate facet hypertrophy. Right facet joint fluid. Severe spinal and right greater than left lateral recess stenosis. Moderate right greater than left L4 foraminal stenosis. L5-S1: Comparatively mild disc osteophyte degeneration. Mild to moderate facet hypertrophy. No spinal or lateral recess stenosis. Mild to moderate L5 foraminal stenosis, greater on the  right. IMPRESSION: 1. Severe degenerative spinal and right lateral recess stenosis at L4-L5. Moderate right greater than left L4 and L5 nerve level foraminal stenosis. 2. No acute osseous abnormality in the lumbar spine. Minimal lower thoracic and lumbar degeneration otherwise. Electronically Signed   By: Genevie Ann M.D.   On: 08/10/2015 21:50   Ct Abdomen Pelvis W Contrast  08/11/2015  CLINICAL DATA:  Increasing fever and back pain, seen here yesterday for same symptoms. EXAM: CT ABDOMEN AND PELVIS WITH CONTRAST TECHNIQUE: Multidetector CT imaging of the abdomen and pelvis was performed using the standard protocol following bolus administration of intravenous contrast. CONTRAST:  145mL ISOVUE-300 IOPAMIDOL (ISOVUE-300) INJECTION 61% COMPARISON:  MRI of the thoracic and lumbar spine July 31, 2015 and CT abdomen and pelvis August 09, 2015 FINDINGS: LUNG BASES: Pleural thickening and enhancing atelectasis. LEFT lower lobe calcified granuloma. 3 mm RIGHT middle lobe pulmonary nodule. SOLID ORGANS: The liver demonstrates subcentimeter probable, unchanged. Dependent gallbladder sludge and punctate cholelithiasis without CT findings of acute cholecystitis. Spleen, gallbladder, pancreas and adrenal glands are unremarkable. GASTROINTESTINAL TRACT: The stomach, small and large bowel are normal in course and caliber without inflammatory changes. Severe descending and sigmoid diverticulosis. Gas and stool distended proximal colon. The appendix is not discretely identified, however there are no inflammatory changes in the right lower quadrant. KIDNEYS/ URINARY TRACT: Kidneys are orthotopic, demonstrating symmetric enhancement. No nephrolithiasis, hydronephrosis or solid renal masses. The unopacified ureters are normal in course and caliber. Delayed imaging through the kidneys demonstrates symmetric prompt contrast excretion  within the proximal urinary collecting system. Urinary bladder is well distended with contrast  opacification. PERITONEUM/RETROPERITONEUM: Aortoiliac vessels are normal in course and caliber, moderate calcific atherosclerosis. No lymphadenopathy by CT size criteria. Internal reproductive organs are unremarkable. No intraperitoneal free fluid nor free air. SOFT TISSUE/OSSEOUS STRUCTURES: Non-suspicious. Streak artifact from bilateral hip arthroplasties. Asymmetric severe RIGHT iliopsoas muscle atrophy. Osteopenia. Stable moderate T8 compression fracture. IMPRESSION: No acute intra-abdominal or pelvic process. Cholelithiasis and severe colonic diverticulosis. Stable old moderate T8 compression fracture. 3 mm RIGHT middle lobe pulmonary nodule. No follow-up needed if patient is low-risk. Non-contrast chest CT can be considered in 12 months if patient is high-risk. This recommendation follows the consensus statement: Guidelines for Management of Incidental Pulmonary Nodules Detected on CT Images:From the Fleischner Society 2017; published online before print (10.1148/radiol.IJ:2314499). Electronically Signed   By: Elon Alas M.D.   On: 08/11/2015 06:38   Ct Abdomen Pelvis W Contrast  08/09/2015  CLINICAL DATA:  Left flank pain since Saturday.  Nausea and fever. EXAM: CT ABDOMEN AND PELVIS WITH CONTRAST TECHNIQUE: Multidetector CT imaging of the abdomen and pelvis was performed using the standard protocol following bolus administration of intravenous contrast. CONTRAST:  1 ISOVUE-300 IOPAMIDOL (ISOVUE-300) INJECTION 61% COMPARISON:  06/15/2015 FINDINGS: Atelectasis in the lung bases.  Small esophageal hiatal hernia. Sub cm low-attenuation lesion in the dome of the liver is too small to characterize but probably represents a small cyst. No change since previous study. Cholelithiasis with small stones in the gallbladder. No inflammatory infiltration. No bile duct dilatation. The pancreas, spleen, adrenal glands, kidneys, abdominal aorta, inferior vena cava, and retroperitoneal lymph nodes are unremarkable.  Stomach, small bowel, and colon are not abnormally distended. Scattered stool and fluid in the colon. No free air or free fluid in the abdomen. Pelvis: Visualization limited due streak artifact from bilateral hip prostheses. Prostate gland appears enlarged. Bladder wall is not thickened. No pelvic mass or lymphadenopathy. Appendix is not identified but no inflammatory changes are suggested. Scattered diverticula in the colon without evidence of diverticulitis. Degenerative changes in the spine. No destructive bone lesions. IMPRESSION: Cholelithiasis.  No acute abnormalities demonstrated. Electronically Signed   By: Lucienne Capers M.D.   On: 08/09/2015 23:04   Dg Chest Portable 1 View  08/11/2015  CLINICAL DATA:  Fever for 3 days EXAM: PORTABLE CHEST 1 VIEW COMPARISON:  February 02, 2012 FINDINGS: There is patchy airspace consolidation in the left base. Lungs elsewhere clear. Heart size and pulmonary vascularity are normal. No adenopathy. There is advanced arthropathy in both shoulders. IMPRESSION: Patchy airspace consolidation left base consistent with a degree of pneumonia. Lungs elsewhere clear. Heart size within normal limits. Followup PA and lateral chest radiographs recommended in 3-4 weeks following trial of antibiotic therapy to ensure resolution and exclude underlying malignancy. Electronically Signed   By: Lowella Grip III M.D.   On: 08/11/2015 07:09   US Abdomen Limited Ruq  08/09/2015  CLINICAL DATA:  Right upper quadrant pain with nausea for 3 days EXAM: US ABDOMEN LIMITED - RIGHT UPPER QUADRANT COMPARISON:  CT abdomen and pelvis June 15, 2015 FINDINGS: Gallbladder: Within the gallbladder, there are echogenic foci which move and shadow consistent with gallstones. Largest gallstone measures 8 mm in length. There is no gallbladder wall thickening or pericholecystic fluid. No sonographic Murphy sign noted by sonographer. Common bile duct: Diameter: 4 mm. No intrahepatic or extrahepatic  biliary duct dilatation. Liver: No focal lesion identified. Within normal limits in parenchymal echogenicity. IMPRESSION: Cholelithiasis.  Study otherwise  unremarkable. Electronically Signed   By: Lowella Grip III M.D.   On: 08/09/2015 21:08     Assessment & Plan  Active Problems:   Essential hypertension   Abdominal pain   Addison's disease ([place])   Spinal stenosis   Rheumatoid arthritis ([place])   Diverticulosis  Fever, abdominal pain -CT abd/pelvis and abd u/s show gallstones and gallbladder sludge, but no acute cholecystitis though suspicion is high based on hx and exam. MRI T/L spine negative for diskitis. ? Of LLL pna on chest xray, but based on hx and exam, suspect this is more atx given pain and decreased inspiratory effort.  -VSS. Lactic acid 1.51,WBC 12.3 with ANC 10.7. LFTs WNL. U/A unremarkable. panculture -Place on empiric Zosyn. PPI -will need stress dose steroids (on chronic steroids) with acute illness -Appreciate surgery assisstance. NPO for now  Hypokalemia, mild -replete IV. Monitor  Hx Addison's disease on chronic steroids -stress dose steroids while acutely ill. (CBG checks) -continue Florinef  Pulmonary nodule -incidental finding on CT. No hx of tobacco abuse or high risk occupation.  -outpt follow-up per primary MD  Rheumatoid arthritis -continue home meds as diet advanced  Essential hypertension -controlled -will continue Norvasc as diet advanced, prn hydralazine for now.  -Hold ARB for now given NPO status, clinical dehydration and 2 CTs with contrast in last 48 hours.   DVT Prophylaxis: SCDs  Family Communication: wife at bedside on admit  Code Status:  Full code  Condition:  Guarded  AM Labs Ordered, also please review Full Orders  Time spent in minutes : [place], NP on 08/11/2015 at 8:24 AM Between 7am to 7pm - Pager - [phone] After 7pm go to [email or web address] - password [email or web address]  And look for the night coverage person  covering me after hours  [facility]

## 2015-08-11 NOTE — ED Provider Notes (Addendum)
00:55- reevaluation post MRI imaging. MRI sent for possibility of discitis.  Medications  diazepam (VALIUM) tablet 5 mg (5 mg Oral Given 08/10/15 1602)  morphine 2 MG/ML injection 2 mg (2 mg Intravenous Given 08/10/15 1601)  potassium chloride SA (K-DUR,KLOR-CON) CR tablet 40 mEq (40 mEq Oral Given 08/10/15 1654)  morphine 2 MG/ML injection 2 mg (2 mg Intravenous Given 08/10/15 2008)  acetaminophen (TYLENOL) tablet 650 mg (650 mg Oral Given 08/10/15 2007)    Patient Vitals for the past 24 hrs:  BP Temp Temp src Pulse Resp SpO2 Height Weight  08/11/15 0000 141/55 mmHg - - 102 (!) 31 90 % - -  08/10/15 2359 - 100 F (37.8 C) Oral - - - - -  08/10/15 2221 - 102.2 F (39 C) Oral - - - - -  08/10/15 2000 147/69 mmHg - - 101 (!) 27 92 % - -  08/10/15 1951 - 102.6 F (39.2 C) Oral - - - - -  08/10/15 1950 - - - - - 92 % - -  08/10/15 1930 137/63 mmHg - - 97 (!) 29 92 % - -  08/10/15 1800 (!) 144/54 mmHg - - 87 25 91 % - -  08/10/15 1715 142/57 mmHg - - 85 22 93 % - -  08/10/15 1700 145/58 mmHg - - 83 24 94 % - -  08/10/15 1645 (!) 128/54 mmHg - - 83 23 93 % - -  08/10/15 1630 126/55 mmHg - - 80 22 93 % - -  08/10/15 1545 133/58 mmHg - - 80 19 95 % - -  08/10/15 1515 127/56 mmHg - - 80 24 94 % - -  08/10/15 1500 (!) 120/51 mmHg - - 77 23 94 % - -  08/10/15 1400 (!) 127/52 mmHg - - 87 24 97 % - -  08/10/15 1204 123/59 mmHg 99.3 F (37.4 C) Oral 95 17 93 % 5\' 3"  (1.6 m) 130 lb (58.968 kg)    John Lyons  08/10/2015  CLINICAL DATA:  70 year old male with right flank, abdominal pain, midline spine pain and fever. Initial encounter. EXAM: MRI THORACIC SPINE WITHOUT Lyons TECHNIQUE: Multiplanar, multisequence John imaging of the thoracic spine was performed. No intravenous Lyons was administered. COMPARISON:  Chest radiographs 02/02/2012. CT Abdomen and Pelvis 08/09/2015. FINDINGS: Limited sagittal imaging of the cervical spine remarkable for chronic disc and endplate  degeneration. No high-grade cervical spinal stenosis. Widespread chronic thoracic compression fractures, predominantly mild from the T2 to the T8 level. T6 and T8 compression is moderate. No associated marrow edema. Lower thoracic levels appear to remain intact. No thoracic spondylolisthesis. No significant thoracic spinal stenosis. Spinal cord signal is within normal limits at all visualized levels. Conus medullaris appears normal at L1-L2. Abnormal dependent signal in both lungs, mildly increased since yesterday. Trace associated right pleural effusion. Cholelithiasis. Stable visualized upper abdominal viscera. Negative visualized posterior paraspinal soft tissues. IMPRESSION: 1. No acute findings or significant spinal stenosis in the thoracic spine. Multilevel chronic thoracic spine compression fractures. 2. Bilateral pulmonary atelectasis versus pneumonia. Trace right pleural effusion. Electronically Signed   By: Genevie Ann M.D.   On: 08/10/2015 21:43   John Lyons  08/10/2015  CLINICAL DATA:  70 year old male with right flank, abdominal pain, midline spine pain and fever. Initial encounter. EXAM: MRI LUMBAR SPINE WITHOUT Lyons TECHNIQUE: Multiplanar, multisequence John imaging of the lumbar spine was performed. No intravenous Lyons was administered. COMPARISON:  Thoracic MRI from today reported  separately. CT Abdomen and Pelvis 08/09/2015. FINDINGS: Normal thoracic and lumbar spine segmentation. No lumbar compression fracture. No marrow edema or evidence of acute osseous abnormality. Visible sacrum intact. Visualized lower thoracic spinal cord is normal with conus medularis at L2. Stable visualized abdominal viscera. Negative visualized posterior paraspinal soft tissues. Mild for age lumbar spine degeneration above L4-L5, without stenosis at those levels. L4-L5: Disc desiccation and disc space loss with right eccentric circumferential disc osteophyte complex. Superimposed severe ligament  flavum and moderate facet hypertrophy. Right facet joint fluid. Severe spinal and right greater than left lateral recess stenosis. Moderate right greater than left L4 foraminal stenosis. L5-S1: Comparatively mild disc osteophyte degeneration. Mild to moderate facet hypertrophy. No spinal or lateral recess stenosis. Mild to moderate L5 foraminal stenosis, greater on the right. IMPRESSION: 1. Severe degenerative spinal and right lateral recess stenosis at L4-L5. Moderate right greater than left L4 and L5 nerve level foraminal stenosis. 2. No acute osseous abnormality in the lumbar spine. Minimal lower thoracic and lumbar degeneration otherwise. Electronically Signed   By: Genevie Ann M.D.   On: 08/10/2015 21:50   Ct Abdomen Pelvis W Lyons  08/09/2015  CLINICAL DATA:  Left flank pain since Saturday.  Nausea and fever. EXAM: CT ABDOMEN AND PELVIS WITH Lyons TECHNIQUE: Multidetector CT imaging of the abdomen and pelvis was performed using the standard protocol following bolus administration of intravenous Lyons. Lyons:  1 ISOVUE-300 IOPAMIDOL (ISOVUE-300) INJECTION 61% COMPARISON:  06/15/2015 FINDINGS: Atelectasis in the lung bases.  Small esophageal hiatal hernia. Sub cm low-attenuation lesion in the dome of the liver is too small to characterize but probably represents a small cyst. No change since previous study. Cholelithiasis with small stones in the gallbladder. No inflammatory infiltration. No bile duct dilatation. The pancreas, spleen, adrenal glands, kidneys, abdominal aorta, inferior vena cava, and retroperitoneal lymph nodes are unremarkable. Stomach, small bowel, and colon are not abnormally distended. Scattered stool and fluid in the colon. No free air or free fluid in the abdomen. Pelvis: Visualization limited due streak artifact from bilateral hip prostheses. Prostate gland appears enlarged. Bladder wall is not thickened. No pelvic mass or lymphadenopathy. Appendix is not identified but no  inflammatory changes are suggested. Scattered diverticula in the colon without evidence of diverticulitis. Degenerative changes in the spine. No destructive bone lesions. IMPRESSION: Cholelithiasis.  No acute abnormalities demonstrated. Electronically Signed   By: Lucienne Capers M.D.   On: 08/09/2015 23:04   US Abdomen Limited Ruq  08/09/2015  CLINICAL DATA:  Right upper quadrant pain with nausea for 3 days EXAM: US ABDOMEN LIMITED - RIGHT UPPER QUADRANT COMPARISON:  CT abdomen and pelvis June 15, 2015 FINDINGS: Gallbladder: Within the gallbladder, there are echogenic foci which move and shadow consistent with gallstones. Largest gallstone measures 8 mm in length. There is no gallbladder wall thickening or pericholecystic fluid. No sonographic Murphy sign noted by sonographer. Common bile duct: Diameter: 4 mm. No intrahepatic or extrahepatic biliary duct dilatation. Liver: No focal lesion identified. Within normal limits in parenchymal echogenicity. IMPRESSION: Cholelithiasis.  Study otherwise unremarkable. Electronically Signed   By: Lowella Grip III M.D.   On: 08/09/2015 21:08     12:56 AM Reevaluation with update and discussion. After initial assessment and treatment, an updated evaluation reveals patient reports he is hungry and thirsty. He continues to have back pain. He states his back pain was initially started, 5 days ago. Family and patient updated on findings. Jaslynne Dahan L    01:50- He is eating and  drinking now but unable to move much d/t pain. He appears brighter. Treated with Percocet as trial to see if able to ambulate. Temp. Improved with Tylenol. BP is stable. Persistent tachypnea, without respiratory symptoms. Will continue to observe. Care to Dr. Venora Maples.  Daleen Bo, MD 08/11/15 [phone]

## 2015-08-11 NOTE — ED Notes (Signed)
Pt taken to [facility].

## 2015-08-11 NOTE — ED Notes (Signed)
Pt taken to CT.

## 2015-08-11 NOTE — ED Notes (Signed)
Attempted to call report to 6N.

## 2015-08-11 NOTE — ED Provider Notes (Signed)
4:17 AM I was involved in the patient's care or nursing staff informed me that he continues to feel poorly.  I went and evaluated the patient.  The patient has had an extensive workup in emergency [place] from a synopsis standpoint sounds as though his had worsening right flank and right-sided abdominal pain over the past several days and high fevers.  He has fever in the emergency department 202.2.  He has obvious tenderness throughout the right abdomen, right upper quadrant, right flank.  He underwent CT imaging and right upper quadrant ultrasound on April 17 which demonstrated no acute abnormalities and he was in the emergency department for evaluation of worsening right flank and back pain and underwent MRI of his thoracic and lumbar spine to evaluate for the possibility of discitis.  MRI lumbar and thoracic spine was unremarkable.  On my evaluation the patient looks acutely ill and has severe right-sided abdominal pain right upper quadrant pain.  He's had no abdominal distention today as well and I suspect this is an intra-abdominal pathologic process.  Patient will undergo repeat CT imaging of his abdomen pelvis at this time and he will need to be admitted to the hospital.  IV Zosyn now.  Liver function tests and lipase added.  No hypotension or tachycardia.  IV fluids now.  I've informed family that he will be admitted to the hospital but that we need to perform additional workup at this time as he appears acutely ill   Jola Schmidt, MD 08/11/15 [phone]

## 2015-08-11 NOTE — Telephone Encounter (Signed)
Pt clear ence below faxed to [place].

## 2015-08-12 ENCOUNTER — Inpatient Hospital Stay (HOSPITAL_COMMUNITY): Payer: Medicare Other | Admitting: Certified Registered Nurse Anesthetist

## 2015-08-12 ENCOUNTER — Encounter (HOSPITAL_COMMUNITY)
Admission: EM | Disposition: A | Discharge status: Home / Self Care | Payer: Self-pay | Source: Home / Self Care | Attending: Internal Medicine

## 2015-08-12 DIAGNOSIS — R1011 Right upper quadrant pain: Secondary | ICD-10-CM

## 2015-08-12 DIAGNOSIS — K8000 Calculus of gallbladder with acute cholecystitis without obstruction: Secondary | ICD-10-CM | POA: Diagnosis present

## 2015-08-12 DIAGNOSIS — E876 Hypokalemia: Secondary | ICD-10-CM | POA: Diagnosis present

## 2015-08-12 HISTORY — PX: CHOLECYSTECTOMY: SHX55

## 2015-08-12 LAB — CBC
HEMATOCRIT: 35.6 % — AB (ref 39.0–52.0)
HEMOGLOBIN: 12.7 g/dL — AB (ref 13.0–17.0)
MCH: 30.7 pg (ref 26.0–34.0)
MCHC: 35.7 g/dL (ref 30.0–36.0)
MCV: 86 fL (ref 78.0–100.0)
Platelets: 273 10*3/uL (ref 150–400)
RBC: 4.14 MIL/uL — ABNORMAL LOW (ref 4.22–5.81)
RDW: 12.4 % (ref 11.5–15.5)
WBC: 11.8 10*3/uL — ABNORMAL HIGH (ref 4.0–10.5)

## 2015-08-12 LAB — COMPREHENSIVE METABOLIC PANEL
ALBUMIN: 2.4 g/dL — AB (ref 3.5–5.0)
ALK PHOS: 56 U/L (ref 38–126)
ALT: 12 U/L — ABNORMAL LOW (ref 17–63)
ANION GAP: 11 (ref 5–15)
AST: 18 U/L (ref 15–41)
BILIRUBIN TOTAL: 0.7 mg/dL (ref 0.3–1.2)
BUN: 10 mg/dL (ref 6–20)
CALCIUM: 7.7 mg/dL — AB (ref 8.9–10.3)
CO2: 21 mmol/L — ABNORMAL LOW (ref 22–32)
Chloride: 106 mmol/L (ref 101–111)
Creatinine, Ser: 0.52 mg/dL — ABNORMAL LOW (ref 0.61–1.24)
Glucose, Bld: 132 mg/dL — ABNORMAL HIGH (ref 65–99)
POTASSIUM: 3.2 mmol/L — AB (ref 3.5–5.1)
Sodium: 138 mmol/L (ref 135–145)
TOTAL PROTEIN: 5.4 g/dL — AB (ref 6.5–8.1)

## 2015-08-12 LAB — URINE CULTURE: Culture: NO GROWTH

## 2015-08-12 LAB — GLUCOSE, CAPILLARY: GLUCOSE-CAPILLARY: 82 mg/dL (ref 65–99)

## 2015-08-12 SURGERY — LAPAROSCOPIC CHOLECYSTECTOMY
Anesthesia: General | Site: Abdomen

## 2015-08-12 MED ORDER — 0.9 % SODIUM CHLORIDE (POUR BTL) OPTIME
TOPICAL | Status: DC | PRN
  Administered 2015-08-12: 1000 mL

## 2015-08-12 MED ORDER — MIDAZOLAM HCL 5 MG/5ML IJ SOLN
INTRAMUSCULAR | Status: DC | PRN
  Administered 2015-08-12: 2 mg via INTRAVENOUS

## 2015-08-12 MED ORDER — KETOROLAC TROMETHAMINE 15 MG/ML IJ SOLN
INTRAMUSCULAR | Status: AC
Start: 2015-08-12 — End: 2015-08-12
  Administered 2015-08-12: 15 mg
  Filled 2015-08-12: qty 1

## 2015-08-12 MED ORDER — FENTANYL CITRATE (PF) 100 MCG/2ML IJ SOLN
INTRAMUSCULAR | Status: DC | PRN
  Administered 2015-08-12: 25 ug via INTRAVENOUS
  Administered 2015-08-12: 100 ug via INTRAVENOUS

## 2015-08-12 MED ORDER — MIDAZOLAM HCL 2 MG/2ML IJ SOLN
INTRAMUSCULAR | Status: AC
  Filled 2015-08-12: qty 2

## 2015-08-12 MED ORDER — OXYCODONE-ACETAMINOPHEN 5-325 MG PO TABS
ORAL_TABLET | ORAL | Status: AC
  Filled 2015-08-12: qty 2

## 2015-08-12 MED ORDER — ROCURONIUM BROMIDE 100 MG/10ML IV SOLN
INTRAVENOUS | Status: DC | PRN
  Administered 2015-08-12: 30 mg via INTRAVENOUS

## 2015-08-12 MED ORDER — PANTOPRAZOLE SODIUM 40 MG PO TBEC
40.0000 mg | DELAYED_RELEASE_TABLET | Freq: Every day | ORAL | Status: DC
  Administered 2015-08-12 – 2015-08-13 (×2): 40 mg via ORAL
  Filled 2015-08-12 (×2): qty 1

## 2015-08-12 MED ORDER — LIDOCAINE HCL (CARDIAC) 20 MG/ML IV SOLN
INTRAVENOUS | Status: AC
  Filled 2015-08-12: qty 5

## 2015-08-12 MED ORDER — BUPIVACAINE-EPINEPHRINE 0.25% -1:200000 IJ SOLN
INTRAMUSCULAR | Status: DC | PRN
  Administered 2015-08-12: 10 mL

## 2015-08-12 MED ORDER — HYDROMORPHONE HCL 1 MG/ML IJ SOLN
0.2500 mg | INTRAMUSCULAR | Status: DC | PRN
  Administered 2015-08-12: 1 mg via INTRAVENOUS

## 2015-08-12 MED ORDER — POTASSIUM CHLORIDE IN NACL 40-0.9 MEQ/L-% IV SOLN
INTRAVENOUS | Status: DC
  Administered 2015-08-12 – 2015-08-13 (×2): 100 mL/h via INTRAVENOUS
  Filled 2015-08-12 (×6): qty 1000

## 2015-08-12 MED ORDER — SODIUM CHLORIDE 0.9 % IR SOLN
Status: DC | PRN
  Administered 2015-08-12 (×2): 1000 mL

## 2015-08-12 MED ORDER — ONDANSETRON HCL 4 MG/2ML IJ SOLN
INTRAMUSCULAR | Status: AC
  Filled 2015-08-12: qty 2

## 2015-08-12 MED ORDER — ROCURONIUM BROMIDE 50 MG/5ML IV SOLN
INTRAVENOUS | Status: AC
  Filled 2015-08-12: qty 1

## 2015-08-12 MED ORDER — SODIUM CHLORIDE 0.9 % IV SOLN: INTRAVENOUS | Status: DC

## 2015-08-12 MED ORDER — PROPOFOL 10 MG/ML IV BOLUS
INTRAVENOUS | Status: DC | PRN
  Administered 2015-08-12: 120 mg via INTRAVENOUS

## 2015-08-12 MED ORDER — HYDROMORPHONE HCL 1 MG/ML IJ SOLN
INTRAMUSCULAR | Status: AC
  Filled 2015-08-12: qty 1

## 2015-08-12 MED ORDER — DEXAMETHASONE SODIUM PHOSPHATE 10 MG/ML IJ SOLN
INTRAMUSCULAR | Status: DC | PRN
  Administered 2015-08-12: 10 mg via INTRAVENOUS

## 2015-08-12 MED ORDER — PROPOFOL 10 MG/ML IV BOLUS
INTRAVENOUS | Status: AC
  Filled 2015-08-12: qty 20

## 2015-08-12 MED ORDER — SUCCINYLCHOLINE CHLORIDE 20 MG/ML IJ SOLN
INTRAMUSCULAR | Status: DC | PRN
  Administered 2015-08-12: 100 mg via INTRAVENOUS

## 2015-08-12 MED ORDER — OXYCODONE-ACETAMINOPHEN 5-325 MG PO TABS
1.0000 | ORAL_TABLET | Freq: Four times a day (QID) | ORAL | Status: DC | PRN
  Administered 2015-08-12: 2 via ORAL
  Administered 2015-08-13: 1 via ORAL
  Filled 2015-08-12: qty 1

## 2015-08-12 MED ORDER — FENTANYL CITRATE (PF) 250 MCG/5ML IJ SOLN
INTRAMUSCULAR | Status: AC
  Filled 2015-08-12: qty 5

## 2015-08-12 MED ORDER — LACTATED RINGERS IV SOLN
INTRAVENOUS | Status: DC
  Administered 2015-08-12 (×3): via INTRAVENOUS

## 2015-08-12 MED ORDER — SUGAMMADEX SODIUM 200 MG/2ML IV SOLN
INTRAVENOUS | Status: DC | PRN
  Administered 2015-08-12: 120 mg via INTRAVENOUS

## 2015-08-12 MED ORDER — BUPIVACAINE-EPINEPHRINE (PF) 0.25% -1:200000 IJ SOLN
INTRAMUSCULAR | Status: AC
  Filled 2015-08-12: qty 30

## 2015-08-12 MED ORDER — ARTIFICIAL TEARS OP OINT
TOPICAL_OINTMENT | OPHTHALMIC | Status: AC
  Filled 2015-08-12: qty 3.5

## 2015-08-12 MED ORDER — ALPRAZOLAM 0.25 MG PO TABS
0.2500 mg | ORAL_TABLET | Freq: Every day | ORAL | Status: DC
  Administered 2015-08-12: 0.25 mg via ORAL
  Filled 2015-08-12: qty 1

## 2015-08-12 MED ORDER — IRBESARTAN 300 MG PO TABS
300.0000 mg | ORAL_TABLET | Freq: Every day | ORAL | Status: DC
  Administered 2015-08-12 – 2015-08-13 (×2): 300 mg via ORAL
  Filled 2015-08-12 (×2): qty 1

## 2015-08-12 MED ORDER — PHENYLEPHRINE 40 MCG/ML (10ML) SYRINGE FOR IV PUSH (FOR BLOOD PRESSURE SUPPORT)
PREFILLED_SYRINGE | INTRAVENOUS | Status: AC
  Filled 2015-08-12: qty 10

## 2015-08-12 MED ORDER — PRAVASTATIN SODIUM 10 MG PO TABS
20.0000 mg | ORAL_TABLET | Freq: Every day | ORAL | Status: DC
  Administered 2015-08-12 – 2015-08-13 (×2): 20 mg via ORAL
  Filled 2015-08-12 (×2): qty 2

## 2015-08-12 MED ORDER — HYDROCORTISONE 20 MG PO TABS
20.0000 mg | ORAL_TABLET | Freq: Every day | ORAL | Status: DC
  Administered 2015-08-12 – 2015-08-13 (×2): 20 mg via ORAL
  Filled 2015-08-12 (×2): qty 1

## 2015-08-12 MED ORDER — KETOROLAC TROMETHAMINE 30 MG/ML IJ SOLN: 15.0000 mg | Freq: Once | INTRAMUSCULAR | Status: DC | PRN

## 2015-08-12 MED ORDER — ONDANSETRON HCL 4 MG/2ML IJ SOLN
INTRAMUSCULAR | Status: DC | PRN
  Administered 2015-08-12: 4 mg via INTRAVENOUS

## 2015-08-12 MED ORDER — LIDOCAINE HCL (CARDIAC) 20 MG/ML IV SOLN
INTRAVENOUS | Status: DC | PRN
  Administered 2015-08-12: 60 mg via INTRAVENOUS

## 2015-08-12 SURGICAL SUPPLY — 42 items
ADH SKN CLS APL DERMABOND .7 (GAUZE/BANDAGES/DRESSINGS) ×2
APPLIER CLIP ROT 10 11.4 M/L (STAPLE)
APR CLP MED LRG 11.4X10 (STAPLE)
BAG SPEC RTRVL 10 TROC 200 (ENDOMECHANICALS) ×2
BLADE SURG ROTATE 9660 (MISCELLANEOUS) ×2 IMPLANT
CANISTER SUCTION 2500CC (MISCELLANEOUS) ×3 IMPLANT
CHLORAPREP W/TINT 26ML (MISCELLANEOUS) ×3 IMPLANT
CLIP APPLIE ROT 10 11.4 M/L (STAPLE) IMPLANT
CLIP LIGATING HEM O LOK PURPLE (MISCELLANEOUS) IMPLANT
CLIP LIGATING HEMO LOK XL GOLD (MISCELLANEOUS) ×2 IMPLANT
COVER MAYO STAND STRL (DRAPES) ×1 IMPLANT
COVER SURGICAL LIGHT HANDLE (MISCELLANEOUS) ×3 IMPLANT
DERMABOND ADVANCED (GAUZE/BANDAGES/DRESSINGS) ×1
DERMABOND ADVANCED .7 DNX12 (GAUZE/BANDAGES/DRESSINGS) ×1 IMPLANT
DRAPE C-ARM 42X72 X-RAY (DRAPES) ×1 IMPLANT
ELECT REM PT RETURN 9FT ADLT (ELECTROSURGICAL) ×3
ELECTRODE REM PT RTRN 9FT ADLT (ELECTROSURGICAL) ×2 IMPLANT
GLOVE BIOGEL PI IND STRL 7.0 (GLOVE) ×2 IMPLANT
GLOVE BIOGEL PI INDICATOR 7.0 (GLOVE) ×1
GLOVE SURG SS PI 7.0 STRL IVOR (GLOVE) ×3 IMPLANT
GOWN STRL REUS W/ TWL LRG LVL3 (GOWN DISPOSABLE) ×6 IMPLANT
GOWN STRL REUS W/TWL LRG LVL3 (GOWN DISPOSABLE) ×9
GRASPER SUT TROCAR 14GX15 (MISCELLANEOUS) ×3 IMPLANT
HEM-O-LOK ×2 IMPLANT
KIT BASIN OR (CUSTOM PROCEDURE TRAY) ×3 IMPLANT
KIT ROOM TURNOVER OR (KITS) ×3 IMPLANT
NS IRRIG 1000ML POUR BTL (IV SOLUTION) ×3 IMPLANT
PAD ARMBOARD 7.5X6 YLW CONV (MISCELLANEOUS) ×3 IMPLANT
POUCH RETRIEVAL ECOSAC 10 (ENDOMECHANICALS) ×2 IMPLANT
POUCH RETRIEVAL ECOSAC 10MM (ENDOMECHANICALS) ×1
SCISSORS LAP 5X35 DISP (ENDOMECHANICALS) ×3 IMPLANT
SET IRRIG TUBING LAPAROSCOPIC (IRRIGATION / IRRIGATOR) ×3 IMPLANT
SLEEVE ENDOPATH XCEL 5M (ENDOMECHANICALS) ×6 IMPLANT
SPECIMEN JAR SMALL (MISCELLANEOUS) ×3 IMPLANT
STOPCOCK 4 WAY LG BORE MALE ST (IV SETS) ×3 IMPLANT
SUT MNCRL AB 4-0 PS2 18 (SUTURE) ×3 IMPLANT
TOWEL OR 17X24 6PK STRL BLUE (TOWEL DISPOSABLE) ×3 IMPLANT
TOWEL OR 17X26 10 PK STRL BLUE (TOWEL DISPOSABLE) ×3 IMPLANT
TRAY LAPAROSCOPIC MC (CUSTOM PROCEDURE TRAY) ×3 IMPLANT
TROCAR XCEL 12X100 BLDLESS (ENDOMECHANICALS) ×3 IMPLANT
TROCAR XCEL NON-BLD 5MMX100MML (ENDOMECHANICALS) ×3 IMPLANT
TUBING INSUFFLATION (TUBING) ×3 IMPLANT

## 2015-08-12 NOTE — Progress Notes (Signed)
Pt returned to [id number] via bed from PACU.  Pt AAO X4.  Pt on 2L O2 via [place].  Pt has 20G to Rt UA with fluids infusing and 24G to Lt hand SL.  Pt has abd lap sites X 4 with skin glue.  SCDs in place.  Report rcvd from [place], [place].  Family to bedside.

## 2015-08-12 NOTE — Anesthesia Postprocedure Evaluation (Signed)
Anesthesia Post Note  Patient: John Lyons  Procedure(s) Performed: Procedure(s) (LRB): LAPAROSCOPIC CHOLECYSTECTOMY (N/A)  Patient location during evaluation: PACU Anesthesia Type: General Level of consciousness: awake and alert Pain management: pain level controlled Vital Signs Assessment: post-procedure vital signs reviewed and stable Respiratory status: spontaneous breathing, nonlabored ventilation, respiratory function stable and patient connected to nasal cannula oxygen Cardiovascular status: blood pressure returned to baseline and stable Postop Assessment: no signs of nausea or vomiting Anesthetic complications: no    Last Vitals:  Filed Vitals:   08/12/15 1154 08/12/15 1159  BP: 151/67 151/67  Pulse:    Temp:  36.7 C  Resp:      Last Pain:  Filed Vitals:   08/12/15 1201  PainSc: 7                  Klayten Jolliff S

## 2015-08-12 NOTE — Anesthesia Procedure Notes (Signed)
Procedure Name: Intubation Date/Time: 08/12/2015 10:32 AM Performed by: [place] Pink Pre-anesthesia Checklist: Patient identified, Emergency Drugs available, Suction available, Patient being monitored and Timeout performed Oxygen Delivery Method: Circle system utilized Intubation Type: IV induction Ventilation: Mask ventilation without difficulty Laryngoscope Size: Glidescope and 4 Grade View: Grade I Tube type: Oral Tube size: 7.0 mm Number of attempts: 1 Airway Equipment and Method: Stylet and Video-laryngoscopy Placement Confirmation: ETT inserted through vocal cords under direct vision,  positive ETCO2 and breath sounds checked- equal and bilateral Secured at: 21 cm Tube secured with: Tape Dental Injury: Teeth and Oropharynx as per pre-operative assessment  Difficulty Due To: Difficult Airway- due to limited oral opening and Difficulty was anticipated

## 2015-08-12 NOTE — Progress Notes (Signed)
Pain continues, slightly improved. Passing flatus. HIDA scan shows no filling of GB until morphine administered. Due to continuing symptoms, we will proceed with removing his gallbladder today -continue ABX -OR for lap chole  Gurney Maxin, M.D. [facility] Pg: [phone]

## 2015-08-12 NOTE — Anesthesia Preprocedure Evaluation (Addendum)
Anesthesia Evaluation  Patient identified by MRN, date of birth, ID band Patient awake    Reviewed: Allergy & Precautions, NPO status , Patient's Chart, lab work & pertinent test results  Airway Mallampati: III  TM Distance: <3 FB Neck ROM: Full    Dental no notable dental hx.    Pulmonary neg pulmonary ROS,    Pulmonary exam normal breath sounds clear to auscultation       Cardiovascular hypertension, Pt. on medications Normal cardiovascular exam Rhythm:Regular Rate:Normal     Neuro/Psych negative neurological ROS  negative psych ROS   GI/Hepatic Neg liver ROS, GERD  Medicated,  Endo/Other  Addison's disease (HCC  Renal/GU negative Renal ROS  negative genitourinary   Musculoskeletal  (+) Arthritis , Rheumatoid disorders,    Abdominal   Peds negative pediatric ROS (+)  Hematology negative hematology ROS (+)   Anesthesia Other Findings   Reproductive/Obstetrics negative OB ROS                            Anesthesia Physical Anesthesia Plan  ASA: III  Anesthesia Plan: General   Post-op Pain Management:    Induction: Intravenous  Airway Management Planned: Oral ETT  Additional Equipment:   Intra-op Plan:   Post-operative Plan: Extubation in OR  Informed Consent: I have reviewed the patients History and Physical, chart, labs and discussed the procedure including the risks, benefits and alternatives for the proposed anesthesia with the patient or authorized representative who has indicated his/her understanding and acceptance.   Dental advisory given  Plan Discussed with: CRNA and Surgeon  Anesthesia Plan Comments:         Anesthesia Quick Evaluation

## 2015-08-12 NOTE — Transfer of Care (Signed)
Immediate Anesthesia Transfer of Care Note  Patient: John Lyons  Procedure(s) Performed: Procedure(s): LAPAROSCOPIC CHOLECYSTECTOMY (N/A)  Patient Location: PACU  Anesthesia Type:General  Level of Consciousness: awake, alert  and oriented  Airway & Oxygen Therapy: Patient Spontanous Breathing and Patient connected to nasal cannula oxygen  Post-op Assessment: Report given to RN, Post -op Vital signs reviewed and stable and Patient moving all extremities X 4  Post vital signs: Reviewed and stable  Last Vitals:  Filed Vitals:   08/11/15 2120 08/12/15 0524  BP: 125/56 137/66  Pulse: 97 83  Temp: 37.3 C 36.7 C  Resp: 20 20    Complications: No apparent anesthesia complications

## 2015-08-12 NOTE — Op Note (Signed)
Preoperative diagnosis: symptomatic gallstones  Postoperative diagnosis: Same   Procedure: laparoscopic cholecystectomy  Surgeon: Gurney Maxin, M.D.  Asst: none  Anesthesia: Gen.   Indications for procedure: John Lyons is a 70 y.o. male with symptoms of RUQ pain and Nausea consistent with gallbladder disease, Confirmed by Ultrasound and CT.  Description of procedure: The patient was brought into the operative suite, placed supine. Anesthesia was administered with endotracheal tube. Patient was strapped in place and foot board was secured. All pressure points were offloaded by foam padding. The patient was prepped and draped in the usual sterile fashion.  A small incision was made to the right of the umbilicus. A 10mm trocar was inserted into the peritoneal cavity with optical entry. Pneumoperitoneum was applied with high flow low pressure. 2 44mm trocars were placed in the RUQ. A 77mm trocar was placed in the subxiphoid space. All trocars sites were first anesthesized with 0.25% marcaine with epinephrine in the subcutaneous and preperitoneal layers. Next the patient was placed in reverse trendelenberg. The colon was large and over the abdomen, but no signs of perforation or infection. The colon and omentum were moved to the lower abdomen. The gallbladder was green in appearance.  The gallbladder was retracted cephalad and lateral. The gallbladder wall was thin and on retraction tore and empty moderate amount of brown bile. The peritoneum was reflected off the infundibulum working lateral to medial. The cystic duct and cystic artery were identified and further dissection revealed a critical view.. The cystic duct and cystic artery were doubly clipped and ligated.   The gallbladder was removed off the liver bed with cautery. The Gallbladder was placed in a specimen bag. The gallbladder fossa was irrigated and hemostasis was applied with cautery. The gallbladder was removed via the 13mm trocar.  No fascial dilation occurred but due to his steroid use, a single 0 vicryl suture was used to close the defect. Pneumoperitoneum was removed, all trocar were removed. All incisions were closed with 4-0 monocryl subcuticular stitch. The patient woke from anesthesia and was brought to PACU in stable condition.  Findings: normal gallbladder anatomy, long colon  Specimen: gallbldadder  Blood loss: <24ml  Local anesthesia: 49ml 0.25% marcaine w epi  Complications: none  Gurney Maxin, M.D. General, Bariatric, & Minimally Invasive Surgery [facility]

## 2015-08-12 NOTE — Progress Notes (Signed)
PROGRESS NOTE                                                                                                                                                                                                             Patient Demographics:    John Lyons, is a 70 y.o. male, DOB - 1945-09-16, [id number]  Admit date - 08/10/2015   Admitting Physician Albertine Patricia, MD  Outpatient Primary MD for the patient is [name], MD  LOS - 1  Outpatient Specialists: none  Chief Complaint  Patient presents with  . Fever  . Back Pain       Brief Narrative   70 year old male with history of rheumatoid arthritis, Addison's disease on chronic steroid, hypertension, hyperlipidemia, GERD presented to the ED with right upper quadrant abdominal pain associated with nausea. On initial visit CT of the abdomen and pelvis and abdominal ultrasound were negative several gallbladder sludge with cholelithiasis but no cholecystitis and was discharged home but he determined by the next day with persistent symptoms. He was also febrile with temperature of 102.60 Fahrenheit, WBC of 12.3 K. Patient admitted for acute cholecystitis and underwent laproscopic cholecystectomy.    Subjective:   Seen and examined after returning from the OR. Feels sore.   Assessment  & Plan :   Principal problem Acute cholecystitis laproscopic cholecystectomy done today. Started on clear liquid. Pain control with when necessary morphine and Percocet. Discontinue antibiotic in the morning. CCS following   Active Problems:   Essential hypertension Stable. Resume home medications.     Addison's disease ([place]) Getting IV Solu Cortef as patient was nothing by mouth. Resume home prednisone    Hypokalemia  replenish        Code Status : Full code  Family Communication  : At bedside  Disposition Plan  : Home possibly in the next 24-48  hours  Barriers For Discharge :  Postop  Consults  :  [place] surgery  Procedures  : Laparoscopic cholecystectomy  DVT Prophylaxis  :  Lovenox -  Lab Results  Component Value Date   PLT 273 08/12/2015    Antibiotics  :  IV Zosyn 4/19-  Anti-infectives    Start     Dose/Rate Route Frequency Ordered Stop   08/11/15 1100  hydroxychloroquine (PLAQUENIL) tablet 200 mg     200  mg Oral Daily 08/11/15 1052     08/11/15 0945  piperacillin-tazobactam (ZOSYN) IVPB 3.375 g     3.375 g 12.5 mL/hr over 240 Minutes Intravenous Every 8 hours 08/11/15 0937     08/11/15 0700  vancomycin (VANCOCIN) IVPB 1000 mg/200 mL premix     1,000 mg 200 mL/hr over 60 Minutes Intravenous  Once 08/11/15 0649 08/11/15 0826   08/11/15 0400  piperacillin-tazobactam (ZOSYN) IVPB 3.375 g     3.375 g 100 mL/hr over 30 Minutes Intravenous  Once 08/11/15 0345 08/11/15 0450        Objective:   Filed Vitals:   08/12/15 1154 08/12/15 1159 08/12/15 1200 08/12/15 1230  BP: 151/67 151/67 140/67 143/68  Pulse:   88 95  Temp:  98.1 F (36.7 C)  98.4 F (36.9 C)  TempSrc:    Oral  Resp:   17 20  Height:      Weight:      SpO2:  96% 96% 95%    Wt Readings from Last 3 Encounters:  08/10/15 58.968 kg (130 lb)  08/09/15 58.968 kg (130 lb)  07/15/15 61.236 kg (135 lb)     Intake/Output Summary (Last 24 hours) at 08/12/15 1417 Last data filed at 08/12/15 1125  Gross per 24 hour  Intake 2993.33 ml  Output    800 ml  Net 2193.33 ml     Physical Exam  Gen: not in distress HEENT: no pallor, moist mucosa, supple neck Chest: clear b/l, no added sounds CVS: N S1&S2, no murmurs, rubs or gallop GI: soft, Nondistended, bowel sounds present, laproscopy site clean Musculoskeletal: warm, no edema CNS: Alert and oriented    Data Review:    CBC  Recent Labs Lab 08/09/15 1534 08/09/15 1951 08/10/15 1219 08/12/15 0345  WBC 13.3*  --  12.3* 11.8*  HGB 14.5  --  13.3 12.7*  HCT 42.5  --  38.5* 35.6*   PLT 300  --  316 273  MCV 86.4  --  87.3 86.0  MCH 29.5  --  30.2 30.7  MCHC 34.1  --  34.5 35.7  RDW 12.7  --  12.7 12.4  LYMPHSABS  --  0.7 0.5*  --   MONOABS  --  0.8 1.0  --   EOSABS  --  0.0 0.0  --   BASOSABS  --  0.0 0.0  --     Chemistries   Recent Labs Lab 08/09/15 1534 08/10/15 1219 08/11/15 0516 08/11/15 1144 08/12/15 0345  NA 134* 138  --   --  138  K 3.3* 3.2*  --   --  3.2*  CL 101 102  --   --  106  CO2 21* 26  --   --  21*  GLUCOSE 123* 134*  --   --  132*  BUN 10 7  --   --  10  CREATININE 0.50* 0.65  --   --  0.52*  CALCIUM 9.0 8.6*  --   --  7.7*  MG  --   --   --  1.7  --   AST 23  --  18  --  18  ALT 22  --  14*  --  12*  ALKPHOS 65  --  54  --  56  BILITOT 0.9  --  1.5*  --  0.7   ------------------------------------------------------------------------------------------------------------------ No results for input(s): CHOL, HDL, LDLCALC, TRIG, CHOLHDL, LDLDIRECT in the last 72 hours.  No results found for: HGBA1C ------------------------------------------------------------------------------------------------------------------  No results for input(s): TSH, T4TOTAL, T3FREE, THYROIDAB in the last 72 hours.  Invalid input(s): FREET3 ------------------------------------------------------------------------------------------------------------------ No results for input(s): VITAMINB12, FOLATE, FERRITIN, TIBC, IRON, RETICCTPCT in the last 72 hours.  Coagulation profile  Recent Labs Lab 08/11/15 1144  INR 1.15    No results for input(s): DDIMER in the last 72 hours.  Cardiac Enzymes No results for input(s): CKMB, TROPONINI, MYOGLOBIN in the last 168 hours.  Invalid input(s): CK ------------------------------------------------------------------------------------------------------------------ No results found for: BNP  Inpatient Medications  Scheduled Meds: . amLODipine  2.5 mg Oral Daily  . digoxin  0.125 mg Oral Daily  . fludrocortisone   0.1 mg Oral Daily  . hydrocortisone sod succinate (SOLU-CORTEF) inj  50 mg Intravenous Q8H  . hydroxychloroquine  200 mg Oral Daily  . pantoprazole (PROTONIX) IV  40 mg Intravenous Q24H  . piperacillin-tazobactam (ZOSYN)  IV  3.375 g Intravenous Q8H   Continuous Infusions: . 0.9 % NaCl with KCl 40 mEq / L 100 mL/hr (08/12/15 0907)  . lactated ringers 50 mL/hr at 08/12/15 0942   PRN Meds:.acetaminophen **OR** acetaminophen, bisacodyl, linaclotide, morphine injection, ondansetron **OR** ondansetron (ZOFRAN) IV, oxyCODONE-acetaminophen  Micro Results Recent Results (from the past 240 hour(s))  Urine culture     Status: None   Collection Time: 08/11/15 11:10 AM  Result Value Ref Range Status   Specimen Description URINE, CLEAN CATCH  Final   Special Requests NONE  Final   Culture NO GROWTH 1 DAY  Final   Report Status 08/12/2015 FINAL  Final    Radiology Reports Mr Thoracic Spine Wo Contrast  08/10/2015  CLINICAL DATA:  70 year old male with right flank, abdominal pain, midline spine pain and fever. Initial encounter. EXAM: MRI THORACIC SPINE WITHOUT CONTRAST TECHNIQUE: Multiplanar, multisequence MR imaging of the thoracic spine was performed. No intravenous contrast was administered. COMPARISON:  Chest radiographs 02/02/2012. CT Abdomen and Pelvis 08/09/2015. FINDINGS: Limited sagittal imaging of the cervical spine remarkable for chronic disc and endplate degeneration. No high-grade cervical spinal stenosis. Widespread chronic thoracic compression fractures, predominantly mild from the T2 to the T8 level. T6 and T8 compression is moderate. No associated marrow edema. Lower thoracic levels appear to remain intact. No thoracic spondylolisthesis. No significant thoracic spinal stenosis. Spinal cord signal is within normal limits at all visualized levels. Conus medullaris appears normal at L1-L2. Abnormal dependent signal in both lungs, mildly increased since yesterday. Trace associated right  pleural effusion. Cholelithiasis. Stable visualized upper abdominal viscera. Negative visualized posterior paraspinal soft tissues. IMPRESSION: 1. No acute findings or significant spinal stenosis in the thoracic spine. Multilevel chronic thoracic spine compression fractures. 2. Bilateral pulmonary atelectasis versus pneumonia. Trace right pleural effusion. Electronically Signed   By: Genevie Ann M.D.   On: 08/10/2015 21:43   Mr Lumbar Spine Wo Contrast  08/10/2015  CLINICAL DATA:  70 year old male with right flank, abdominal pain, midline spine pain and fever. Initial encounter. EXAM: MRI LUMBAR SPINE WITHOUT CONTRAST TECHNIQUE: Multiplanar, multisequence MR imaging of the lumbar spine was performed. No intravenous contrast was administered. COMPARISON:  Thoracic MRI from today reported separately. CT Abdomen and Pelvis 08/09/2015. FINDINGS: Normal thoracic and lumbar spine segmentation. No lumbar compression fracture. No marrow edema or evidence of acute osseous abnormality. Visible sacrum intact. Visualized lower thoracic spinal cord is normal with conus medularis at L2. Stable visualized abdominal viscera. Negative visualized posterior paraspinal soft tissues. Mild for age lumbar spine degeneration above L4-L5, without stenosis at those levels. L4-L5: Disc desiccation and disc space loss with right  eccentric circumferential disc osteophyte complex. Superimposed severe ligament flavum and moderate facet hypertrophy. Right facet joint fluid. Severe spinal and right greater than left lateral recess stenosis. Moderate right greater than left L4 foraminal stenosis. L5-S1: Comparatively mild disc osteophyte degeneration. Mild to moderate facet hypertrophy. No spinal or lateral recess stenosis. Mild to moderate L5 foraminal stenosis, greater on the right. IMPRESSION: 1. Severe degenerative spinal and right lateral recess stenosis at L4-L5. Moderate right greater than left L4 and L5 nerve level foraminal stenosis. 2. No  acute osseous abnormality in the lumbar spine. Minimal lower thoracic and lumbar degeneration otherwise. Electronically Signed   By: Genevie Ann M.D.   On: 08/10/2015 21:50   Nm Hepatobiliary Liver Func  08/11/2015  CLINICAL DATA:  Right upper quadrant abdominal pain, cholelithiasis EXAM: NUCLEAR MEDICINE HEPATOBILIARY IMAGING TECHNIQUE: Sequential images of the abdomen were obtained out to 60 minutes following intravenous administration of radiopharmaceutical. RADIOPHARMACEUTICALS:  5.2 mCi Tc-39m  Choletec IV COMPARISON:  CT abdomen pelvis dated 08/11/2015 FINDINGS: Normal hepatic excretion. Small bowel is visualized within 15 minutes. Gallbladder was not visualized after 60 minutes. At that time, 2.4 mg morphine IV was administered. Gallbladder was visualized 15 minutes after administration of morphine, conferring patency of the cystic duct. IMPRESSION: Normal hepatobiliary nuclear medicine scan. Electronically Signed   By: Julian Hy M.D.   On: 08/11/2015 16:04   Ct Abdomen Pelvis W Contrast  08/11/2015  CLINICAL DATA:  Increasing fever and back pain, seen here yesterday for same symptoms. EXAM: CT ABDOMEN AND PELVIS WITH CONTRAST TECHNIQUE: Multidetector CT imaging of the abdomen and pelvis was performed using the standard protocol following bolus administration of intravenous contrast. CONTRAST:  137mL ISOVUE-300 IOPAMIDOL (ISOVUE-300) INJECTION 61% COMPARISON:  MRI of the thoracic and lumbar spine July 31, 2015 and CT abdomen and pelvis August 09, 2015 FINDINGS: LUNG BASES: Pleural thickening and enhancing atelectasis. LEFT lower lobe calcified granuloma. 3 mm RIGHT middle lobe pulmonary nodule. SOLID ORGANS: The liver demonstrates subcentimeter probable, unchanged. Dependent gallbladder sludge and punctate cholelithiasis without CT findings of acute cholecystitis. Spleen, gallbladder, pancreas and adrenal glands are unremarkable. GASTROINTESTINAL TRACT: The stomach, small and large bowel are normal  in course and caliber without inflammatory changes. Severe descending and sigmoid diverticulosis. Gas and stool distended proximal colon. The appendix is not discretely identified, however there are no inflammatory changes in the right lower quadrant. KIDNEYS/ URINARY TRACT: Kidneys are orthotopic, demonstrating symmetric enhancement. No nephrolithiasis, hydronephrosis or solid renal masses. The unopacified ureters are normal in course and caliber. Delayed imaging through the kidneys demonstrates symmetric prompt contrast excretion within the proximal urinary collecting system. Urinary bladder is well distended with contrast opacification. PERITONEUM/RETROPERITONEUM: Aortoiliac vessels are normal in course and caliber, moderate calcific atherosclerosis. No lymphadenopathy by CT size criteria. Internal reproductive organs are unremarkable. No intraperitoneal free fluid nor free air. SOFT TISSUE/OSSEOUS STRUCTURES: Non-suspicious. Streak artifact from bilateral hip arthroplasties. Asymmetric severe RIGHT iliopsoas muscle atrophy. Osteopenia. Stable moderate T8 compression fracture. IMPRESSION: No acute intra-abdominal or pelvic process. Cholelithiasis and severe colonic diverticulosis. Stable old moderate T8 compression fracture. 3 mm RIGHT middle lobe pulmonary nodule. No follow-up needed if patient is low-risk. Non-contrast chest CT can be considered in 12 months if patient is high-risk. This recommendation follows the consensus statement: Guidelines for Management of Incidental Pulmonary Nodules Detected on CT Images:From the Fleischner Society 2017; published online before print (10.1148/radiol.SG:5268862). Electronically Signed   By: Elon Alas M.D.   On: 08/11/2015 06:38   Ct Abdomen Pelvis W  Contrast  08/09/2015  CLINICAL DATA:  Left flank pain since Saturday.  Nausea and fever. EXAM: CT ABDOMEN AND PELVIS WITH CONTRAST TECHNIQUE: Multidetector CT imaging of the abdomen and pelvis was performed using  the standard protocol following bolus administration of intravenous contrast. CONTRAST:  1 ISOVUE-300 IOPAMIDOL (ISOVUE-300) INJECTION 61% COMPARISON:  06/15/2015 FINDINGS: Atelectasis in the lung bases.  Small esophageal hiatal hernia. Sub cm low-attenuation lesion in the dome of the liver is too small to characterize but probably represents a small cyst. No change since previous study. Cholelithiasis with small stones in the gallbladder. No inflammatory infiltration. No bile duct dilatation. The pancreas, spleen, adrenal glands, kidneys, abdominal aorta, inferior vena cava, and retroperitoneal lymph nodes are unremarkable. Stomach, small bowel, and colon are not abnormally distended. Scattered stool and fluid in the colon. No free air or free fluid in the abdomen. Pelvis: Visualization limited due streak artifact from bilateral hip prostheses. Prostate gland appears enlarged. Bladder wall is not thickened. No pelvic mass or lymphadenopathy. Appendix is not identified but no inflammatory changes are suggested. Scattered diverticula in the colon without evidence of diverticulitis. Degenerative changes in the spine. No destructive bone lesions. IMPRESSION: Cholelithiasis.  No acute abnormalities demonstrated. Electronically Signed   By: Lucienne Capers M.D.   On: 08/09/2015 23:04   Dg Chest Portable 1 View  08/11/2015  CLINICAL DATA:  Fever for 3 days EXAM: PORTABLE CHEST 1 VIEW COMPARISON:  February 02, 2012 FINDINGS: There is patchy airspace consolidation in the left base. Lungs elsewhere clear. Heart size and pulmonary vascularity are normal. No adenopathy. There is advanced arthropathy in both shoulders. IMPRESSION: Patchy airspace consolidation left base consistent with a degree of pneumonia. Lungs elsewhere clear. Heart size within normal limits. Followup PA and lateral chest radiographs recommended in 3-4 weeks following trial of antibiotic therapy to ensure resolution and exclude underlying malignancy.  Electronically Signed   By: Lowella Grip III M.D.   On: 08/11/2015 07:09   US Abdomen Limited Ruq  08/09/2015  CLINICAL DATA:  Right upper quadrant pain with nausea for 3 days EXAM: US ABDOMEN LIMITED - RIGHT UPPER QUADRANT COMPARISON:  CT abdomen and pelvis June 15, 2015 FINDINGS: Gallbladder: Within the gallbladder, there are echogenic foci which move and shadow consistent with gallstones. Largest gallstone measures 8 mm in length. There is no gallbladder wall thickening or pericholecystic fluid. No sonographic Murphy sign noted by sonographer. Common bile duct: Diameter: 4 mm. No intrahepatic or extrahepatic biliary duct dilatation. Liver: No focal lesion identified. Within normal limits in parenchymal echogenicity. IMPRESSION: Cholelithiasis.  Study otherwise unremarkable. Electronically Signed   By: Lowella Grip III M.D.   On: 08/09/2015 21:08    Time Spent in minutes  25   Louellen Molder M.D on 08/12/2015 at 2:17 PM  Between 7am to 7pm - Pager - [phone]  After 7pm go to [email or web address] - password [facility]  [facility] -  Office  [phone]

## 2015-08-13 DIAGNOSIS — I10 Essential (primary) hypertension: Secondary | ICD-10-CM

## 2015-08-13 LAB — BASIC METABOLIC PANEL
ANION GAP: 12 (ref 5–15)
BUN: 9 mg/dL (ref 6–20)
CALCIUM: 7.8 mg/dL — AB (ref 8.9–10.3)
CHLORIDE: 105 mmol/L (ref 101–111)
CO2: 22 mmol/L (ref 22–32)
Creatinine, Ser: 0.46 mg/dL — ABNORMAL LOW (ref 0.61–1.24)
GFR calc non Af Amer: >60 mL/min (ref >60)
GLUCOSE: 131 mg/dL — AB (ref 65–99)
Potassium: 3.5 mmol/L (ref 3.5–5.1)
Sodium: 139 mmol/L (ref 135–145)

## 2015-08-13 LAB — CBC
HEMATOCRIT: 35.9 % — AB (ref 39.0–52.0)
HEMOGLOBIN: 12.3 g/dL — AB (ref 13.0–17.0)
MCH: 29.1 pg (ref 26.0–34.0)
MCHC: 34.3 g/dL (ref 30.0–36.0)
MCV: 85.1 fL (ref 78.0–100.0)
Platelets: 339 10*3/uL (ref 150–400)
RBC: 4.22 MIL/uL (ref 4.22–5.81)
RDW: 12.3 % (ref 11.5–15.5)
WBC: 7.5 10*3/uL (ref 4.0–10.5)

## 2015-08-13 LAB — GLUCOSE, CAPILLARY: Glucose-Capillary: 133 mg/dL — ABNORMAL HIGH (ref 65–99)

## 2015-08-13 MED ORDER — POLYETHYLENE GLYCOL 3350 17 G PO PACK: 17.0000 g | PACK | Freq: Every day | ORAL | Status: DC | PRN

## 2015-08-13 MED ORDER — PHENOL 1.4 % MT LIQD
1.0000 | OROMUCOSAL | Status: DC | PRN
  Administered 2015-08-13: 1 via OROMUCOSAL
  Filled 2015-08-13: qty 177

## 2015-08-13 MED ORDER — BISACODYL 10 MG RE SUPP
10.0000 mg | Freq: Once | RECTAL | Status: AC
  Administered 2015-08-13: 10 mg via RECTAL
  Filled 2015-08-13: qty 1

## 2015-08-13 MED ORDER — POLYETHYLENE GLYCOL 3350 17 G PO PACK
17.0000 g | PACK | Freq: Every day | ORAL | Status: DC
  Administered 2015-08-13: 17 g via ORAL
  Filled 2015-08-13: qty 1

## 2015-08-13 MED ORDER — DOCUSATE SODIUM 100 MG PO CAPS: 100.0000 mg | ORAL_CAPSULE | Freq: Two times a day (BID) | ORAL | Status: DC

## 2015-08-13 MED ORDER — DOCUSATE SODIUM 100 MG PO CAPS
100.0000 mg | ORAL_CAPSULE | Freq: Two times a day (BID) | ORAL | Status: DC
  Administered 2015-08-13: 100 mg via ORAL
  Filled 2015-08-13: qty 1

## 2015-08-13 NOTE — Progress Notes (Signed)
Pt discharged to home.  Discharge instructions explained to pt.  Pt has no questions at the time of discharge.  IV's removed.  Pt states he has all belongings.  Pt taken off unit in wheelchair by staff.

## 2015-08-13 NOTE — Progress Notes (Signed)
Patient ID: John Lyons, male   DOB: 06/25/1945, 70 y.o.   MRN: [id number]     [facility]      [address] [place], [place] [address]    Phone: [phone] FAX: [phone]     Subjective: Passing flatus.  Tolerating POs. Pain has tremendously improved. Afebrile.  VSS.   Objective:  Vital signs:  Filed Vitals:   08/12/15 1752 08/12/15 2229 08/13/15 0321 08/13/15 1050  BP: 150/73 156/75 138/62 123/65  Pulse: 99 94 88 87  Temp: 98.5 F (36.9 C) 98.8 F (37.1 C) 98 F (36.7 C)   TempSrc: Oral Oral Oral   Resp: '20 20 20   '$ Height:      Weight:      SpO2: 97% 99% 95%     Last BM Date: 08/06/15  Intake/Output   Yesterday:  04/20 0701 - 04/21 0700 In: 1157 [P.O.:160; I.V.:2641; IV Piggyback:50] Out: 2620 [Urine:1100; Blood:25] This shift: I/O last 3 completed shifts: In: 4059.3 [P.O.:160; I.V.:3849.3; IV Piggyback:50] Out: 1900 [BTDHR:4163; Blood:25]    Physical Exam: General: Pt awake/alert/oriented x4 in no acute distress  Abdomen: Soft.  Nondistended.  Mildly tender at incisions only.  No evidence of peritonitis.  No incarcerated hernias.    Problem List:   Active Problems:   Essential hypertension   Abdominal pain   Addison's disease ([place])   Spinal stenosis   Rheumatoid arthritis ([place])   Diverticulosis   Fever   Acute calculous cholecystitis   Hypokalemia    Results:   Labs: Results for orders placed or performed during the hospital encounter of 08/10/15 (from the past 48 hour(s))  Magnesium     Status: None   Collection Time: 08/11/15 11:44 AM  Result Value Ref Range   Magnesium 1.7 1.7 - 2.4 mg/dL  APTT     Status: None   Collection Time: 08/11/15 11:44 AM  Result Value Ref Range   aPTT 31 24 - 37 seconds  Protime-INR     Status: None   Collection Time: 08/11/15 11:44 AM  Result Value Ref Range   Prothrombin Time 14.9 11.6 - 15.2 seconds   INR 1.15 0.00 - 1.49  CBC     Status:  Abnormal   Collection Time: 08/12/15  3:45 AM  Result Value Ref Range   WBC 11.8 (H) 4.0 - 10.5 K/uL   RBC 4.14 (L) 4.22 - 5.81 MIL/uL   Hemoglobin 12.7 (L) 13.0 - 17.0 g/dL   HCT 35.6 (L) 39.0 - 52.0 %   MCV 86.0 78.0 - 100.0 fL   MCH 30.7 26.0 - 34.0 pg   MCHC 35.7 30.0 - 36.0 g/dL   RDW 12.4 11.5 - 15.5 %   Platelets 273 150 - 400 K/uL  Comprehensive metabolic panel     Status: Abnormal   Collection Time: 08/12/15  3:45 AM  Result Value Ref Range   Sodium 138 135 - 145 mmol/L   Potassium 3.2 (L) 3.5 - 5.1 mmol/L   Chloride 106 101 - 111 mmol/L   CO2 21 (L) 22 - 32 mmol/L   Glucose, Bld 132 (H) 65 - 99 mg/dL   BUN 10 6 - 20 mg/dL   Creatinine, Ser 0.52 (L) 0.61 - 1.24 mg/dL   Calcium 7.7 (L) 8.9 - 10.3 mg/dL   Total Protein 5.4 (L) 6.5 - 8.1 g/dL   Albumin 2.4 (L) 3.5 - 5.0 g/dL   AST 18 15 - 41 U/L   ALT  12 (L) 17 - 63 U/L   Alkaline Phosphatase 56 38 - 126 U/L   Total Bilirubin 0.7 0.3 - 1.2 mg/dL   GFR calc non Af Amer >60 >60 mL/min   GFR calc Af Amer >60 >60 mL/min    Comment: (NOTE) The eGFR has been calculated using the CKD EPI equation. This calculation has not been validated in all clinical situations. eGFR's persistently <60 mL/min signify possible Chronic Kidney Disease.    Anion gap 11 5 - 15  Glucose, capillary     Status: None   Collection Time: 08/12/15  8:30 AM  Result Value Ref Range   Glucose-Capillary 82 65 - 99 mg/dL  Basic metabolic panel     Status: Abnormal   Collection Time: 08/13/15  5:34 AM  Result Value Ref Range   Sodium 139 135 - 145 mmol/L   Potassium 3.5 3.5 - 5.1 mmol/L   Chloride 105 101 - 111 mmol/L   CO2 22 22 - 32 mmol/L   Glucose, Bld 131 (H) 65 - 99 mg/dL   BUN 9 6 - 20 mg/dL   Creatinine, Ser 0.46 (L) 0.61 - 1.24 mg/dL   Calcium 7.8 (L) 8.9 - 10.3 mg/dL   GFR calc non Af Amer >60 >60 mL/min   GFR calc Af Amer >60 >60 mL/min    Comment: (NOTE) The eGFR has been calculated using the CKD EPI equation. This calculation has  not been validated in all clinical situations. eGFR's persistently <60 mL/min signify possible Chronic Kidney Disease.    Anion gap 12 5 - 15  CBC     Status: Abnormal   Collection Time: 08/13/15  5:34 AM  Result Value Ref Range   WBC 7.5 4.0 - 10.5 K/uL   RBC 4.22 4.22 - 5.81 MIL/uL   Hemoglobin 12.3 (L) 13.0 - 17.0 g/dL   HCT 35.9 (L) 39.0 - 52.0 %   MCV 85.1 78.0 - 100.0 fL   MCH 29.1 26.0 - 34.0 pg   MCHC 34.3 30.0 - 36.0 g/dL   RDW 12.3 11.5 - 15.5 %   Platelets 339 150 - 400 K/uL  Glucose, capillary     Status: Abnormal   Collection Time: 08/13/15  7:46 AM  Result Value Ref Range   Glucose-Capillary 133 (H) 65 - 99 mg/dL   Comment 1 Notify RN     Imaging / Studies: Nm Hepatobiliary Liver Func  08/11/2015  CLINICAL DATA:  Right upper quadrant abdominal pain, cholelithiasis EXAM: NUCLEAR MEDICINE HEPATOBILIARY IMAGING TECHNIQUE: Sequential images of the abdomen were obtained out to 60 minutes following intravenous administration of radiopharmaceutical. RADIOPHARMACEUTICALS:  5.2 mCi Tc-65m Choletec IV COMPARISON:  CT abdomen pelvis dated 08/11/2015 FINDINGS: Normal hepatic excretion. Small bowel is visualized within 15 minutes. Gallbladder was not visualized after 60 minutes. At that time, 2.4 mg morphine IV was administered. Gallbladder was visualized 15 minutes after administration of morphine, conferring patency of the cystic duct. IMPRESSION: Normal hepatobiliary nuclear medicine scan. Electronically Signed   By: SJulian HyM.D.   On: 08/11/2015 16:04    Medications / Allergies:  Scheduled Meds: . ALPRAZolam  0.25 mg Oral QHS  . amLODipine  2.5 mg Oral Daily  . bisacodyl  10 mg Rectal Once  . digoxin  0.125 mg Oral Daily  . docusate sodium  100 mg Oral BID  . fludrocortisone  0.1 mg Oral Daily  . hydrocortisone  20 mg Oral Daily  . hydroxychloroquine  200 mg Oral Daily  .  irbesartan  300 mg Oral Daily  . pantoprazole  40 mg Oral Daily  . polyethylene glycol   17 g Oral Daily  . pravastatin  20 mg Oral Daily   Continuous Infusions: . 0.9 % NaCl with KCl 40 mEq / L 100 mL/hr (08/13/15 0316)  . lactated ringers 50 mL/hr at 08/12/15 0942   PRN Meds:.acetaminophen **OR** acetaminophen, bisacodyl, linaclotide, morphine injection, ondansetron **OR** ondansetron (ZOFRAN) IV, oxyCODONE-acetaminophen, phenol  Antibiotics: Anti-infectives    Start     Dose/Rate Route Frequency Ordered Stop   08/11/15 1100  hydroxychloroquine (PLAQUENIL) tablet 200 mg     200 mg Oral Daily 08/11/15 1052     08/11/15 0945  piperacillin-tazobactam (ZOSYN) IVPB 3.375 g  Status:  Discontinued     3.375 g 12.5 mL/hr over 240 Minutes Intravenous Every 8 hours 08/11/15 0937 08/13/15 1100   08/11/15 0700  vancomycin (VANCOCIN) IVPB 1000 mg/200 mL premix     1,000 mg 200 mL/hr over 60 Minutes Intravenous  Once 08/11/15 0649 08/11/15 0826   08/11/15 0400  piperacillin-tazobactam (ZOSYN) IVPB 3.375 g     3.375 g 100 mL/hr over 30 Minutes Intravenous  Once 08/11/15 0345 08/11/15 0450        Assessment/Plan Gallstones POD#1 lap chole -pain has improved, tolerating POs.  Will start a bowel regimen, encourage mobilization, IS, pain control -discharge per primary team -follow up arranged and instructions provided.   Erby Pian, [facility] Pager [phone]) For consults and floor pages call [phone](7A-4:30P)  08/13/2015 11:13 AM

## 2015-08-13 NOTE — Discharge Summary (Signed)
Physician Discharge Summary  Algert Ryland [id number] DOB: [birth year] DOA: 08/10/2015  PCP: [name], MD  Admit date: 08/10/2015 Discharge date: 08/13/2015  Time spent: 25 minutes  Recommendations for Outpatient Follow-up:  Discharge home with outpatient PCP and surgery follow-up.  Discharge Diagnoses:  Principal Problem:   Acute calculous cholecystitis   Active Problems:   Abdominal pain   Essential hypertension   Addison's disease ([place])   Spinal stenosis   Rheumatoid arthritis ([place])   Diverticulosis   Fever   Hypokalemia   Discharge Condition: Fair  Diet recommendation: Left  CODE STATUS: Full code  Filed Weights   08/10/15 1204  Weight: 58.968 kg (130 lb)    History of present illness:  Please refer to admission H&P for details, in brief, 70 year old male with history of rheumatoid arthritis, Addison's disease on chronic steroid, hypertension, hyperlipidemia, GERD presented to the ED with right upper quadrant abdominal pain associated with nausea. On initial visit CT of the abdomen and pelvis and abdominal ultrasound were negative several gallbladder sludge with cholelithiasis but no cholecystitis and was discharged home but he determined by the next day with persistent symptoms. He was also febrile with temperature of 102.60 Fahrenheit, WBC of 12.3 K. Patient admitted for acute cholecystitis and underwent laproscopic cholecystectomy.  Hospital Course:  Principal problem Acute calculus cholecystitis laproscopic cholecystectomy done in 4/20. Tolerated well. Tolerating advanced diet. Pain control with when necessary Percocet. Prescribed bowel regimen. Discontinued antibiotics. Okay for discharge per surgery. Outpatient follow-up arranged  Active Problems:  Essential hypertension Stable. Tinea home medications.    Addison's disease ([place]) Continue home dose prednisone and Florinef.   Hypokalemia  replenished     Patient clinically stable and  can be discharged home with outpatient follow-up.     Family Communication : At bedside  Disposition Plan : Home     Consults : [facility]  Procedures : Laparoscopic cholecystectomy   Discharge Exam: Filed Vitals:   08/13/15 0321 08/13/15 1050  BP: 138/62 123/65  Pulse: 88 87  Temp: 98 F (36.7 C)   Resp: 20     Gen: Elderly male, not in distress HEENT: no pallor, moist mucosa, supple neck Chest: clear b/l, no added sounds CVS: N S1&S2, no murmurs, rubs or gallop GI: soft, Nondistended, bowel sounds present, laproscopy site clean, minimally tender Musculoskeletal: warm, no edema CNS: Alert and oriented  Discharge Instructions    Current Discharge Medication List    START taking these medications   Details  diazepam (VALIUM) 5 MG tablet Take 0.5 tablets (2.5 mg total) by mouth every 6 (six) hours as needed for muscle spasms. Qty: 20 tablet, Refills: 0    docusate sodium (COLACE) 100 MG capsule Take 1 capsule (100 mg total) by mouth 2 (two) times daily. Qty: 10 capsule, Refills: 0    oxyCODONE-acetaminophen (PERCOCET) 5-325 MG tablet Take 1 tablet by mouth every 4 (four) hours as needed for severe pain. Qty: 20 tablet, Refills: 0    polyethylene glycol (MIRALAX / GLYCOLAX) packet Take 17 g by mouth daily as needed for mild constipation. Qty: 14 each, Refills: 0      CONTINUE these medications which have NOT CHANGED   Details  acetaminophen (TYLENOL) 500 MG tablet Take 1,000 mg by mouth every 6 (six) hours as needed for mild pain. For pain    ALPRAZolam (XANAX) 0.25 MG tablet Take 0.25 mg by mouth at bedtime.    amLODipine (NORVASC) 2.5 MG tablet Take 2.5 mg by mouth daily.  Calcium Carbonate-Vitamin D (CALCIUM-VITAMIN D) 500-200 MG-UNIT per tablet Take 1 tablet by mouth daily.     Cholecalciferol (VITAMIN D3) 1000 UNITS CAPS Take 1,000 Units by mouth daily.     digoxin (LANOXIN) 0.125 MG tablet Take 0.125 mg by mouth daily.     fludrocortisone (FLORINEF) 0.1 MG tablet Take 0.1 mg by mouth daily.    folic acid (FOLVITE) A999333 MCG tablet Take 400 mcg by mouth daily.    hydrocortisone (CORTEF) 20 MG tablet Take 20 mg by mouth daily.     hydroxychloroquine (PLAQUENIL) 200 MG tablet Take 200 mg by mouth daily.    linaclotide (LINZESS) 145 MCG CAPS capsule Take 145 mcg by mouth daily as needed (constipation).    Multiple Vitamin (MULTIVITAMIN WITH MINERALS) TABS Take 1 tablet by mouth daily.    pantoprazole (PROTONIX) 40 MG tablet Take 40 mg by mouth daily.    pravastatin (PRAVACHOL) 20 MG tablet Take 20 mg by mouth daily.    Propylene Glycol-Glycerin (SOOTHE) 0.6-0.6 % SOLN Place 1 drop into both eyes daily as needed (dryness).    telmisartan (MICARDIS) 80 MG tablet Take 40 mg by mouth daily.    traMADol (ULTRAM) 50 MG tablet Take 50 mg by mouth every 6 (six) hours as needed for moderate pain.    zoledronic acid (RECLAST) 5 MG/100ML SOLN injection Inject 5 mg into the vein See admin instructions. Once a year      STOP taking these medications     HYDROcodone-acetaminophen (NORCO) 5-325 MG tablet        Allergies  Allergen Reactions  . Adhesive [Tape]     Rash    . Phenobarbital Rash   Follow-up Information    Follow up with PERINI,MARK A, MD. Schedule an appointment as soon as possible for a visit in 3 days.   Specialty:  Internal Medicine   Contact information:   [address] [place] [address] [phone]       Follow up with [facility] On 09/01/2015.   Specialty:  General Surgery   Why:  arrive by 11:15AM for a 11:45AM post op check   Contact information:   [address] [place] [address] [phone]       Follow up with PERINI,MARK A, MD. Schedule an appointment as soon as possible for a visit in 1 week.   Specialty:  Internal Medicine   Contact information:   [place] [address] [phone]        The results of  significant diagnostics from this hospitalization (including imaging, microbiology, ancillary and laboratory) are listed below for reference.    Significant Diagnostic Studies: Mr Thoracic Spine Wo Contrast  08/10/2015  CLINICAL DATA:  70 year old male with right flank, abdominal pain, midline spine pain and fever. Initial encounter. EXAM: MRI THORACIC SPINE WITHOUT CONTRAST TECHNIQUE: Multiplanar, multisequence MR imaging of the thoracic spine was performed. No intravenous contrast was administered. COMPARISON:  Chest radiographs 02/02/2012. CT Abdomen and Pelvis 08/09/2015. FINDINGS: Limited sagittal imaging of the cervical spine remarkable for chronic disc and endplate degeneration. No high-grade cervical spinal stenosis. Widespread chronic thoracic compression fractures, predominantly mild from the T2 to the T8 level. T6 and T8 compression is moderate. No associated marrow edema. Lower thoracic levels appear to remain intact. No thoracic spondylolisthesis. No significant thoracic spinal stenosis. Spinal cord signal is within normal limits at all visualized levels. Conus medullaris appears normal at L1-L2. Abnormal dependent signal in both lungs, mildly increased since  yesterday. Trace associated right pleural effusion. Cholelithiasis. Stable visualized upper abdominal viscera. Negative visualized posterior paraspinal soft tissues. IMPRESSION: 1. No acute findings or significant spinal stenosis in the thoracic spine. Multilevel chronic thoracic spine compression fractures. 2. Bilateral pulmonary atelectasis versus pneumonia. Trace right pleural effusion. Electronically Signed   By: Genevie Ann M.D.   On: 08/10/2015 21:43   Mr Lumbar Spine Wo Contrast  08/10/2015  CLINICAL DATA:  70 year old male with right flank, abdominal pain, midline spine pain and fever. Initial encounter. EXAM: MRI LUMBAR SPINE WITHOUT CONTRAST TECHNIQUE: Multiplanar, multisequence MR imaging of the lumbar spine was performed. No  intravenous contrast was administered. COMPARISON:  Thoracic MRI from today reported separately. CT Abdomen and Pelvis 08/09/2015. FINDINGS: Normal thoracic and lumbar spine segmentation. No lumbar compression fracture. No marrow edema or evidence of acute osseous abnormality. Visible sacrum intact. Visualized lower thoracic spinal cord is normal with conus medularis at L2. Stable visualized abdominal viscera. Negative visualized posterior paraspinal soft tissues. Mild for age lumbar spine degeneration above L4-L5, without stenosis at those levels. L4-L5: Disc desiccation and disc space loss with right eccentric circumferential disc osteophyte complex. Superimposed severe ligament flavum and moderate facet hypertrophy. Right facet joint fluid. Severe spinal and right greater than left lateral recess stenosis. Moderate right greater than left L4 foraminal stenosis. L5-S1: Comparatively mild disc osteophyte degeneration. Mild to moderate facet hypertrophy. No spinal or lateral recess stenosis. Mild to moderate L5 foraminal stenosis, greater on the right. IMPRESSION: 1. Severe degenerative spinal and right lateral recess stenosis at L4-L5. Moderate right greater than left L4 and L5 nerve level foraminal stenosis. 2. No acute osseous abnormality in the lumbar spine. Minimal lower thoracic and lumbar degeneration otherwise. Electronically Signed   By: Genevie Ann M.D.   On: 08/10/2015 21:50   Nm Hepatobiliary Liver Func  08/11/2015  CLINICAL DATA:  Right upper quadrant abdominal pain, cholelithiasis EXAM: NUCLEAR MEDICINE HEPATOBILIARY IMAGING TECHNIQUE: Sequential images of the abdomen were obtained out to 60 minutes following intravenous administration of radiopharmaceutical. RADIOPHARMACEUTICALS:  5.2 mCi Tc-18m  Choletec IV COMPARISON:  CT abdomen pelvis dated 08/11/2015 FINDINGS: Normal hepatic excretion. Small bowel is visualized within 15 minutes. Gallbladder was not visualized after 60 minutes. At that time, 2.4  mg morphine IV was administered. Gallbladder was visualized 15 minutes after administration of morphine, conferring patency of the cystic duct. IMPRESSION: Normal hepatobiliary nuclear medicine scan. Electronically Signed   By: Julian Hy M.D.   On: 08/11/2015 16:04   Ct Abdomen Pelvis W Contrast  08/11/2015  CLINICAL DATA:  Increasing fever and back pain, seen here yesterday for same symptoms. EXAM: CT ABDOMEN AND PELVIS WITH CONTRAST TECHNIQUE: Multidetector CT imaging of the abdomen and pelvis was performed using the standard protocol following bolus administration of intravenous contrast. CONTRAST:  162mL ISOVUE-300 IOPAMIDOL (ISOVUE-300) INJECTION 61% COMPARISON:  MRI of the thoracic and lumbar spine July 31, 2015 and CT abdomen and pelvis August 09, 2015 FINDINGS: LUNG BASES: Pleural thickening and enhancing atelectasis. LEFT lower lobe calcified granuloma. 3 mm RIGHT middle lobe pulmonary nodule. SOLID ORGANS: The liver demonstrates subcentimeter probable, unchanged. Dependent gallbladder sludge and punctate cholelithiasis without CT findings of acute cholecystitis. Spleen, gallbladder, pancreas and adrenal glands are unremarkable. GASTROINTESTINAL TRACT: The stomach, small and large bowel are normal in course and caliber without inflammatory changes. Severe descending and sigmoid diverticulosis. Gas and stool distended proximal colon. The appendix is not discretely identified, however there are no inflammatory changes in the right lower quadrant. KIDNEYS/ URINARY  TRACT: Kidneys are orthotopic, demonstrating symmetric enhancement. No nephrolithiasis, hydronephrosis or solid renal masses. The unopacified ureters are normal in course and caliber. Delayed imaging through the kidneys demonstrates symmetric prompt contrast excretion within the proximal urinary collecting system. Urinary bladder is well distended with contrast opacification. PERITONEUM/RETROPERITONEUM: Aortoiliac vessels are normal in  course and caliber, moderate calcific atherosclerosis. No lymphadenopathy by CT size criteria. Internal reproductive organs are unremarkable. No intraperitoneal free fluid nor free air. SOFT TISSUE/OSSEOUS STRUCTURES: Non-suspicious. Streak artifact from bilateral hip arthroplasties. Asymmetric severe RIGHT iliopsoas muscle atrophy. Osteopenia. Stable moderate T8 compression fracture. IMPRESSION: No acute intra-abdominal or pelvic process. Cholelithiasis and severe colonic diverticulosis. Stable old moderate T8 compression fracture. 3 mm RIGHT middle lobe pulmonary nodule. No follow-up needed if patient is low-risk. Non-contrast chest CT can be considered in 12 months if patient is high-risk. This recommendation follows the consensus statement: Guidelines for Management of Incidental Pulmonary Nodules Detected on CT Images:From the Fleischner Society 2017; published online before print (10.1148/radiol.IJ:2314499). Electronically Signed   By: Elon Alas M.D.   On: 08/11/2015 06:38   Ct Abdomen Pelvis W Contrast  08/09/2015  CLINICAL DATA:  Left flank pain since Saturday.  Nausea and fever. EXAM: CT ABDOMEN AND PELVIS WITH CONTRAST TECHNIQUE: Multidetector CT imaging of the abdomen and pelvis was performed using the standard protocol following bolus administration of intravenous contrast. CONTRAST:  1 ISOVUE-300 IOPAMIDOL (ISOVUE-300) INJECTION 61% COMPARISON:  06/15/2015 FINDINGS: Atelectasis in the lung bases.  Small esophageal hiatal hernia. Sub cm low-attenuation lesion in the dome of the liver is too small to characterize but probably represents a small cyst. No change since previous study. Cholelithiasis with small stones in the gallbladder. No inflammatory infiltration. No bile duct dilatation. The pancreas, spleen, adrenal glands, kidneys, abdominal aorta, inferior vena cava, and retroperitoneal lymph nodes are unremarkable. Stomach, small bowel, and colon are not abnormally distended. Scattered  stool and fluid in the colon. No free air or free fluid in the abdomen. Pelvis: Visualization limited due streak artifact from bilateral hip prostheses. Prostate gland appears enlarged. Bladder wall is not thickened. No pelvic mass or lymphadenopathy. Appendix is not identified but no inflammatory changes are suggested. Scattered diverticula in the colon without evidence of diverticulitis. Degenerative changes in the spine. No destructive bone lesions. IMPRESSION: Cholelithiasis.  No acute abnormalities demonstrated. Electronically Signed   By: Lucienne Capers M.D.   On: 08/09/2015 23:04   Dg Chest Portable 1 View  08/11/2015  CLINICAL DATA:  Fever for 3 days EXAM: PORTABLE CHEST 1 VIEW COMPARISON:  February 02, 2012 FINDINGS: There is patchy airspace consolidation in the left base. Lungs elsewhere clear. Heart size and pulmonary vascularity are normal. No adenopathy. There is advanced arthropathy in both shoulders. IMPRESSION: Patchy airspace consolidation left base consistent with a degree of pneumonia. Lungs elsewhere clear. Heart size within normal limits. Followup PA and lateral chest radiographs recommended in 3-4 weeks following trial of antibiotic therapy to ensure resolution and exclude underlying malignancy. Electronically Signed   By: Lowella Grip III M.D.   On: 08/11/2015 07:09   US Abdomen Limited Ruq  08/09/2015  CLINICAL DATA:  Right upper quadrant pain with nausea for 3 days EXAM: US ABDOMEN LIMITED - RIGHT UPPER QUADRANT COMPARISON:  CT abdomen and pelvis June 15, 2015 FINDINGS: Gallbladder: Within the gallbladder, there are echogenic foci which move and shadow consistent with gallstones. Largest gallstone measures 8 mm in length. There is no gallbladder wall thickening or pericholecystic fluid. No sonographic Murphy sign  noted by sonographer. Common bile duct: Diameter: 4 mm. No intrahepatic or extrahepatic biliary duct dilatation. Liver: No focal lesion identified. Within normal  limits in parenchymal echogenicity. IMPRESSION: Cholelithiasis.  Study otherwise unremarkable. Electronically Signed   By: Lowella Grip III M.D.   On: 08/09/2015 21:08    Microbiology: Recent Results (from the past 240 hour(s))  Urine culture     Status: None   Collection Time: 08/11/15 11:10 AM  Result Value Ref Range Status   Specimen Description URINE, CLEAN CATCH  Final   Special Requests NONE  Final   Culture NO GROWTH 1 DAY  Final   Report Status 08/12/2015 FINAL  Final     Labs: Basic Metabolic Panel:  Recent Labs Lab 08/09/15 1534 08/10/15 1219 08/11/15 1144 08/12/15 0345 08/13/15 0534  NA 134* 138  --  138 139  K 3.3* 3.2*  --  3.2* 3.5  CL 101 102  --  106 105  CO2 21* 26  --  21* 22  GLUCOSE 123* 134*  --  132* 131*  BUN 10 7  --  10 9  CREATININE 0.50* 0.65  --  0.52* 0.46*  CALCIUM 9.0 8.6*  --  7.7* 7.8*  MG  --   --  1.7  --   --    Liver Function Tests:  Recent Labs Lab 08/09/15 1534 08/11/15 0516 08/12/15 0345  AST 23 18 18   ALT 22 14* 12*  ALKPHOS 65 54 56  BILITOT 0.9 1.5* 0.7  PROT 6.9 6.1* 5.4*  ALBUMIN 3.9 2.9* 2.4*    Recent Labs Lab 08/11/15 0516  LIPASE 22   No results for input(s): AMMONIA in the last 168 hours. CBC:  Recent Labs Lab 08/09/15 1534 08/09/15 1951 08/10/15 1219 08/12/15 0345 08/13/15 0534  WBC 13.3*  --  12.3* 11.8* 7.5  NEUTROABS  --  8.2* 10.7*  --   --   HGB 14.5  --  13.3 12.7* 12.3*  HCT 42.5  --  38.5* 35.6* 35.9*  MCV 86.4  --  87.3 86.0 85.1  PLT 300  --  316 273 339   Cardiac Enzymes: No results for input(s): CKTOTAL, CKMB, CKMBINDEX, TROPONINI in the last 168 hours. BNP: BNP (last 3 results) No results for input(s): BNP in the last 8760 hours.  ProBNP (last 3 results) No results for input(s): PROBNP in the last 8760 hours.  CBG:  Recent Labs Lab 08/12/15 0830 08/13/15 0746  GLUCAP 82 133*       Signed:  Louellen Molder MD.  [facility] 08/13/2015, 2:05  PM

## 2015-08-13 NOTE — Discharge Instructions (Signed)

## 2015-08-13 NOTE — Care Management (Signed)
Patient requesting bedside commode for home . Same ordered through [facility]. Magdalen Spatz RN BSN [phone]

## 2015-08-16 ENCOUNTER — Encounter (HOSPITAL_COMMUNITY): Payer: Self-pay | Admitting: General Surgery

## 2015-08-19 DIAGNOSIS — R509 Fever, unspecified: Secondary | ICD-10-CM | POA: Diagnosis not present

## 2015-09-07 DIAGNOSIS — M150 Primary generalized (osteo)arthritis: Secondary | ICD-10-CM | POA: Diagnosis not present

## 2015-09-07 DIAGNOSIS — M810 Age-related osteoporosis without current pathological fracture: Secondary | ICD-10-CM | POA: Diagnosis not present

## 2015-09-07 DIAGNOSIS — E271 Primary adrenocortical insufficiency: Secondary | ICD-10-CM | POA: Diagnosis not present

## 2015-09-07 DIAGNOSIS — M0589 Other rheumatoid arthritis with rheumatoid factor of multiple sites: Secondary | ICD-10-CM | POA: Diagnosis not present

## 2015-09-11 DIAGNOSIS — Z6822 Body mass index (BMI) 22.0-22.9, adult: Secondary | ICD-10-CM | POA: Diagnosis not present

## 2015-09-11 DIAGNOSIS — I10 Essential (primary) hypertension: Secondary | ICD-10-CM | POA: Diagnosis not present

## 2015-09-11 DIAGNOSIS — R600 Localized edema: Secondary | ICD-10-CM | POA: Diagnosis not present

## 2015-09-11 DIAGNOSIS — M069 Rheumatoid arthritis, unspecified: Secondary | ICD-10-CM | POA: Diagnosis not present

## 2015-09-11 DIAGNOSIS — R509 Fever, unspecified: Secondary | ICD-10-CM | POA: Diagnosis not present

## 2015-09-11 DIAGNOSIS — Z9049 Acquired absence of other specified parts of digestive tract: Secondary | ICD-10-CM | POA: Diagnosis not present

## 2015-09-15 ENCOUNTER — Ambulatory Visit: Payer: Self-pay | Admitting: Orthopedic Surgery

## 2015-10-13 ENCOUNTER — Ambulatory Visit: Payer: Self-pay | Admitting: Orthopedic Surgery

## 2015-10-13 DIAGNOSIS — T8484XD Pain due to internal orthopedic prosthetic devices, implants and grafts, subsequent encounter: Secondary | ICD-10-CM | POA: Diagnosis not present

## 2015-10-13 NOTE — H&P (Signed)
TOTAL HIP REVISION ADMISSION H&P  Patient is admitted for left revision total hip arthroplasty.  Subjective:  Chief Complaint: left hip pain  HPI: John Lyons, 70 y.o. male, has a history of pain and functional disability in the left hip due to arthritis and patient has failed non-surgical conservative treatments for greater than 12 weeks to include flexibility and strengthening excercises, use of assistive devices, weight reduction as appropriate and activity modification. The indications for the revision total hip arthroplasty are loosening of one or more components, fracture or mechanical failure of one or more component and progressive or substantial periprosthetic bone loss.  Onset of symptoms was gradual starting >10 years ago with gradually worsening course since that time.  Prior procedures on the left hip include arthroplasty.  Patient currently rates pain in the left hip at 10 out of 10 with activity.  There is night pain, worsening of pain with activity and weight bearing, trendelenberg gait, pain that interfers with activities of daily living, pain with passive range of motion and crepitus. Patient has evidence of prosthetic loosening and massive acetabulr bone loss by imaging studies.  This condition presents safety issues increasing the risk of falls.  There is no current active infection.  Patient Active Problem List   Diagnosis Date Noted  . Acute calculous cholecystitis   . Hypokalemia   . Abdominal pain 08/11/2015  . Addison's disease ([place]) 08/11/2015  . Spinal stenosis 08/11/2015  . Rheumatoid arthritis ([place]) 08/11/2015  . Diverticulosis 08/11/2015  . Fever 08/11/2015  . Pyrexia   . Midline low back pain without sciatica   . Essential hypertension 07/13/2015   Past Medical History  Diagnosis Date  . Addison's disease ([place])   . PONV (postoperative nausea and vomiting)   . Rheumatoid arthritis(714.0)   . Hypertension   . H/O hiatal hernia   . GERD (gastroesophageal  reflux disease)   . Dysrhythmia     hx rapid heart rate in 70s, on digoxin  . Osteoporosis   . Cholecystitis, acute 07/2015    Past Surgical History  Procedure Laterality Date  . Total knee arthroplasty  1984    Left   . Total hip arthroplasty  1979    bilat  . Eye surgery      cataracts  . Cardiovascular stress test  01/04/12    Dr. Gwenlyn Found - [facility]  . Total knee arthroplasty  02/12/2012    right  . Total knee arthroplasty  02/12/2012    Procedure: TOTAL KNEE ARTHROPLASTY;  Surgeon: Rudean Haskell, MD;  Location: [facility];  Service: Orthopedics;  Laterality: Right;  . Vasectomy  1979  . Cholecystectomy N/A 08/12/2015    Procedure: LAPAROSCOPIC CHOLECYSTECTOMY;  Surgeon: Mickeal Skinner, MD;  Location: [facility];  Service: General;  Laterality: N/A;     (Not in a hospital admission) Allergies  Allergen Reactions  . Adhesive [Tape]     Rash    . Phenobarbital Rash    Social History  Substance Use Topics  . Smoking status: Never Smoker   . Smokeless tobacco: Never Used  . Alcohol Use: No    No family history on file.    Review of Systems  Constitutional: Negative.   HENT: Negative.   Eyes: Negative.   Respiratory: Negative.   Cardiovascular: Positive for leg swelling.  Gastrointestinal: Positive for constipation.  Genitourinary: Negative.   Musculoskeletal: Positive for joint pain.  Skin: Negative.   Neurological: Negative.   Endo/Heme/Allergies: Positive for environmental allergies.  Psychiatric/Behavioral: Negative.     Objective:  Physical Exam  Constitutional: He is oriented to person, place, and time. He appears well-developed and well-nourished.  HENT:  Head: Normocephalic and atraumatic.  Eyes: Conjunctivae and EOM are normal. Pupils are equal, round, and reactive to light.  Neck: Normal range of motion. Neck supple.  Cardiovascular: Normal rate and regular rhythm.   Respiratory: Effort normal and breath sounds normal.  GI: Soft. Bowel  sounds are normal. He exhibits no distension.  Genitourinary:  deferred  Musculoskeletal:       Left hip: He exhibits decreased range of motion, decreased strength and crepitus.  Neurological: He is alert and oriented to person, place, and time. He has normal reflexes.  Skin: Skin is dry.  Psychiatric: He has a normal mood and affect. His behavior is normal. Judgment and thought content normal.    Vital signs in last 24 hours: @VSRANGES @   Labs:   Estimated body mass index is 23.03 kg/(m^2) as calculated from the following:   Height as of 08/10/15: 5\' 3"  (1.6 m).   Weight as of 08/10/15: 58.968 kg (130 lb).  Imaging Review:  Plain radiographs demonstrate failed left total hip arthroplasty.  There is evidence of loosening of the acetabular cup and femoral stem.The bone quality appears to be poor for age and reported activity level. There is massive acetabular bone loss with Paprosky 3A defect.  Assessment/Plan:  End stage arthritis, left hip(s) with failed previous arthroplasty.  The patient history, physical examination, clinical judgement of the provider and imaging studies are consistent with end stage degenerative joint disease of the left hip(s), previous total hip arthroplasty. Revision total hip arthroplasty is deemed medically necessary. The treatment options including medical management, injection therapy, arthroscopy and arthroplasty were discussed at length. The risks and benefits of total hip arthroplasty were presented and reviewed. The risks due to aseptic loosening, infection, stiffness, dislocation/subluxation,  thromboembolic complications and other imponderables were discussed.  The patient acknowledged the explanation, agreed to proceed with the plan and consent was signed. Patient is being admitted for inpatient treatment for surgery, pain control, PT, OT, prophylactic antibiotics, VTE prophylaxis, progressive ambulation and ADL's and discharge planning. The patient is  planning to be discharged to skilled nursing facility

## 2015-10-14 ENCOUNTER — Encounter: Payer: Self-pay | Admitting: Podiatry

## 2015-10-14 ENCOUNTER — Ambulatory Visit (INDEPENDENT_AMBULATORY_CARE_PROVIDER_SITE_OTHER): Payer: Medicare Other | Admitting: Podiatry

## 2015-10-14 VITALS — BP 145/76 | HR 79 | Resp 16

## 2015-10-14 DIAGNOSIS — L84 Corns and callosities: Secondary | ICD-10-CM | POA: Diagnosis not present

## 2015-10-14 DIAGNOSIS — M779 Enthesopathy, unspecified: Secondary | ICD-10-CM

## 2015-10-14 MED ORDER — TRIAMCINOLONE ACETONIDE 10 MG/ML IJ SUSP
10.0000 mg | Freq: Once | INTRAMUSCULAR | Status: AC
  Administered 2015-10-14: 10 mg

## 2015-10-15 NOTE — Progress Notes (Signed)
Subjective:     Patient ID: John Lyons, male   DOB: 1946-04-19, 70 y.o.   MRN: [id number]  HPI patient presents with pain in the right fifth MPJ with fluid buildup underneath the joint surface and pain when palpated with thick lesion formation   Review of Systems     Objective:   Physical Exam Inflammatory capsulitis right fifth MPJ with fluid buildup and keratotic lesion formation    Assessment:     Capsulitis right fifth MPJ    Plan:     Sterile prep and injected the right fifth MPJ 3 mg dexamethasone Kenalog 5 mg Xylocaine and debrided the lesion fully with instructions on padding and wearing soft-type shoes

## 2015-10-22 ENCOUNTER — Encounter (HOSPITAL_COMMUNITY): Payer: Self-pay

## 2015-10-22 ENCOUNTER — Encounter (HOSPITAL_COMMUNITY)
Admission: RE | Admit: 2015-10-22 | Discharge: 2015-10-22 | Disposition: A | Discharge status: Home / Self Care | Payer: Medicare Other | Source: Ambulatory Visit | Attending: Orthopedic Surgery | Admitting: Orthopedic Surgery

## 2015-10-22 DIAGNOSIS — M069 Rheumatoid arthritis, unspecified: Secondary | ICD-10-CM | POA: Diagnosis not present

## 2015-10-22 DIAGNOSIS — E785 Hyperlipidemia, unspecified: Secondary | ICD-10-CM | POA: Insufficient documentation

## 2015-10-22 DIAGNOSIS — Z0183 Encounter for blood typing: Secondary | ICD-10-CM | POA: Insufficient documentation

## 2015-10-22 DIAGNOSIS — I10 Essential (primary) hypertension: Secondary | ICD-10-CM | POA: Insufficient documentation

## 2015-10-22 DIAGNOSIS — Z7983 Long term (current) use of bisphosphonates: Secondary | ICD-10-CM | POA: Diagnosis not present

## 2015-10-22 DIAGNOSIS — Z96652 Presence of left artificial knee joint: Secondary | ICD-10-CM | POA: Diagnosis not present

## 2015-10-22 DIAGNOSIS — E271 Primary adrenocortical insufficiency: Secondary | ICD-10-CM | POA: Insufficient documentation

## 2015-10-22 DIAGNOSIS — Z01812 Encounter for preprocedural laboratory examination: Secondary | ICD-10-CM | POA: Insufficient documentation

## 2015-10-22 DIAGNOSIS — Z79899 Other long term (current) drug therapy: Secondary | ICD-10-CM | POA: Diagnosis not present

## 2015-10-22 DIAGNOSIS — Z01818 Encounter for other preprocedural examination: Secondary | ICD-10-CM | POA: Insufficient documentation

## 2015-10-22 DIAGNOSIS — Z7952 Long term (current) use of systemic steroids: Secondary | ICD-10-CM | POA: Diagnosis not present

## 2015-10-22 HISTORY — DX: Personal history of colon polyps, unspecified: Z86.0100

## 2015-10-22 HISTORY — DX: Diverticulosis of intestine, part unspecified, without perforation or abscess without bleeding: K57.90

## 2015-10-22 HISTORY — DX: Pain in unspecified joint: M25.50

## 2015-10-22 HISTORY — DX: Personal history of colonic polyps: Z86.010

## 2015-10-22 HISTORY — DX: Hyperlipidemia, unspecified: E78.5

## 2015-10-22 HISTORY — DX: Constipation, unspecified: K59.00

## 2015-10-22 HISTORY — DX: Effusion, unspecified joint: M25.40

## 2015-10-22 HISTORY — DX: Anxiety disorder, unspecified: F41.9

## 2015-10-22 HISTORY — DX: Xerosis cutis: L85.3

## 2015-10-22 HISTORY — DX: Personal history of other medical treatment: Z92.89

## 2015-10-22 HISTORY — DX: Weakness: R53.1

## 2015-10-22 LAB — CBC
HCT: 42.2 % (ref 39.0–52.0)
HEMOGLOBIN: 14.1 g/dL (ref 13.0–17.0)
MCH: 29 pg (ref 26.0–34.0)
MCHC: 33.4 g/dL (ref 30.0–36.0)
MCV: 86.8 fL (ref 78.0–100.0)
Platelets: 342 10*3/uL (ref 150–400)
RBC: 4.86 MIL/uL (ref 4.22–5.81)
RDW: 13.5 % (ref 11.5–15.5)
WBC: 6.7 10*3/uL (ref 4.0–10.5)

## 2015-10-22 LAB — BASIC METABOLIC PANEL
ANION GAP: 8 (ref 5–15)
BUN: 9 mg/dL (ref 6–20)
CALCIUM: 9.2 mg/dL (ref 8.9–10.3)
CO2: 26 mmol/L (ref 22–32)
CREATININE: 0.5 mg/dL — AB (ref 0.61–1.24)
Chloride: 105 mmol/L (ref 101–111)
GFR calc non Af Amer: >60 mL/min (ref >60)
Glucose, Bld: 113 mg/dL — ABNORMAL HIGH (ref 65–99)
Potassium: 3.4 mmol/L — ABNORMAL LOW (ref 3.5–5.1)
SODIUM: 139 mmol/L (ref 135–145)

## 2015-10-22 LAB — SURGICAL PCR SCREEN
MRSA, PCR: NEGATIVE
STAPHYLOCOCCUS AUREUS: NEGATIVE

## 2015-10-22 MED ORDER — CHLORHEXIDINE GLUCONATE 4 % EX LIQD: 60.0000 mL | Freq: Once | CUTANEOUS | Status: DC

## 2015-10-22 NOTE — Pre-Procedure Instructions (Signed)
Mccormick Rachlin  10/22/2015      [facility] - [place], [place] - [address] [place] [address] Phone: [phone] Fax: [phone]    Your procedure is scheduled on Mon, July 10 @ 12:40 PM  Report to [facility] Admitting at 10:40 AM  Call this number if you have problems the morning of surgery:  [phone]   Remember:  Do not eat food or drink liquids after midnight.  Take these medicines the morning of surgery with A SIP OF WATER Amlodipine(Norvasc),Digoxin(Lanoxin),Pain Pill(if needed),Pantoprazole(Protonix),and Eye Drops(if needed)              No Goody's,BC's,Aleve,Advil,Motrin,Ibuprofen,Fish Oil,or any Herbal Medications.    Do not wear jewelry.  Do not wear lotions, powders, or perfumes.    You may shave your face and neck.  Do not bring valuables to the hospital.  [facility] is not responsible for any belongings or valuables.  Contacts, dentures or bridgework may not be worn into surgery.  Leave your suitcase in the car.  After surgery it may be brought to your room.  For patients admitted to the hospital, discharge time will be determined by your treatment team.  Patients discharged the day of surgery will not be allowed to drive home.    [facility]  Before surgery, you can play an important role.  Because skin is not sterile, your skin needs to be as free of germs as possible.  You can reduce the number of germs on you skin by washing with CHG (chlorahexidine gluconate) soap before surgery.  CHG is an antiseptic cleaner which kills germs and bonds with the skin to continue killing germs even after washing.  Please DO NOT use if you have an allergy to CHG or antibacterial soaps.  If your skin becomes reddened/irritated stop using the CHG and inform your nurse when you arrive at Short Stay.  Do not shave (including legs and  underarms) for at least 48 hours prior to the first CHG shower.  You may shave your face.  Please follow these instructions carefully:   1.  Shower with CHG Soap the night before surgery and the                                morning of Surgery.  2.  If you choose to wash your hair, wash your hair first as usual with your       normal shampoo.  3.  After you shampoo, rinse your hair and body thoroughly to remove the                      Shampoo.  4.  Use CHG as you would any other liquid soap.  You can apply chg directly       to the skin and wash gently with scrungie or a clean washcloth.  5.  Apply the CHG Soap to your body ONLY FROM THE NECK DOWN.        Do not use on open wounds or open sores.  Avoid contact with your eyes,       ears, mouth and genitals (private parts).  Wash genitals (private parts)       with your normal soap.  6.  Wash thoroughly, paying special attention to  the area where your surgery        will be performed.  7.  Thoroughly rinse your body with warm water from the neck down.  8.  DO NOT shower/wash with your normal soap after using and rinsing off       the CHG Soap.  9.  Pat yourself dry with a clean towel.            10.  Wear clean pajamas.            11.  Place clean sheets on your bed the night of your first shower and do not        sleep with pets.  Day of Surgery  Do not apply any lotions/deoderants the morning of surgery.  Please wear clean clothes to the hospital/surgery center.  :    Please read over the following fact sheets that you were given. Pain Booklet, Coughing and Deep Breathing, MRSA Information and Surgical Site Infection Prevention

## 2015-10-22 NOTE — Progress Notes (Addendum)
Cardiologist is Dr.Jonathan Gwenlyn Found with last visit in epic from 07-13-15  Medical Md is Dr.Mark Joylene Draft  Echo denies  Stress test report in epic from 07-15-15  EKG in epic from 07-13-15  Heart cath denies  CXR portable done in April 2017 with report in epic

## 2015-10-25 DIAGNOSIS — Z6822 Body mass index (BMI) 22.0-22.9, adult: Secondary | ICD-10-CM | POA: Diagnosis not present

## 2015-10-25 DIAGNOSIS — M06831 Other specified rheumatoid arthritis, right wrist: Secondary | ICD-10-CM | POA: Diagnosis not present

## 2015-10-25 NOTE — Progress Notes (Signed)
Anesthesia Chart Review:  Pt is a 70 year old male scheduled for revision L total hip arthroplasty with both acetabular and femoral components, posterior approach on 11/01/2015 with Rod Can, MD.   PCP is [name], MD. Saw cardiologist Quay Burow, MD for pre-op eval 07/13/15. Both Dr. Joylene Draft and Dr. Gwenlyn Found have cleared pt for surgery.   PMH includes:  HTN, hyperlipidemia, Addison's disease, RA, post-op N/V. Never smoker. BMI 23. S/p cholecystectomy 08/12/15. S/p L TKA 02/12/12  Medications include: amlodipine, digoxin, fludrocortisone, hydrocortisone,  hydroxychloroquine, protonix, pravastatin, telmisartan, zoledronic acid  Preoperative labs reviewed.    1 view CXR 08/11/15: Patchy airspace consolidation left base consistent with a degree of pneumonia. Lungs elsewhere clear. Heart size within normal limits. Followup PA and lateral chest radiographs recommended in 3-4 weeks following trial of antibiotic therapy to ensure resolution and exclude underlying malignancy.  Will repeat CXR DOS.   EKG 07/13/15: sinus rhythm.   Nuclear stress test 07/15/15:   The left ventricular ejection fraction is normal (55-65%).  Nuclear stress EF: 62%.  No T wave inversion was noted during stress.  There was no ST segment deviation noted during stress.  This is a low risk study. - Normal perfusion. LVEF 62% with normal wall motion. This is a low risk study.  If no changes, I anticipate pt can proceed with surgery as scheduled.   Willeen Cass, FNP-BC [facility] Short Stay Surgical Center/Anesthesiology Phone: [phone] 10/25/2015 3:19 PM

## 2015-10-27 DIAGNOSIS — M0589 Other rheumatoid arthritis with rheumatoid factor of multiple sites: Secondary | ICD-10-CM | POA: Diagnosis not present

## 2015-10-27 DIAGNOSIS — M150 Primary generalized (osteo)arthritis: Secondary | ICD-10-CM | POA: Diagnosis not present

## 2015-10-27 DIAGNOSIS — M810 Age-related osteoporosis without current pathological fracture: Secondary | ICD-10-CM | POA: Diagnosis not present

## 2015-10-27 DIAGNOSIS — E271 Primary adrenocortical insufficiency: Secondary | ICD-10-CM | POA: Diagnosis not present

## 2015-10-29 MED ORDER — TRANEXAMIC ACID 1000 MG/10ML IV SOLN
1000.0000 mg | INTRAVENOUS | Status: AC
  Administered 2015-11-01: 1000 mg via INTRAVENOUS
  Filled 2015-10-29: qty 10

## 2015-10-29 MED ORDER — CEFAZOLIN SODIUM-DEXTROSE 2-4 GM/100ML-% IV SOLN
2.0000 g | INTRAVENOUS | Status: AC
  Administered 2015-11-01 (×3): 2 g via INTRAVENOUS
  Filled 2015-10-29: qty 100

## 2015-10-29 MED ORDER — ACETAMINOPHEN 10 MG/ML IV SOLN
1000.0000 mg | INTRAVENOUS | Status: AC
  Administered 2015-11-01: 1000 mg via INTRAVENOUS

## 2015-10-29 MED ORDER — VANCOMYCIN HCL IN DEXTROSE 1-5 GM/200ML-% IV SOLN
1000.0000 mg | INTRAVENOUS | Status: AC
  Administered 2015-11-01: 1000 mg via INTRAVENOUS
  Filled 2015-10-29: qty 200

## 2015-10-29 MED ORDER — SODIUM CHLORIDE 0.9 % IV SOLN
INTRAVENOUS | Status: DC
  Administered 2015-11-01: 20:00:00 via INTRAVENOUS

## 2015-11-01 ENCOUNTER — Inpatient Hospital Stay (HOSPITAL_COMMUNITY): Payer: Medicare Other

## 2015-11-01 ENCOUNTER — Inpatient Hospital Stay (HOSPITAL_COMMUNITY): Payer: Medicare Other | Admitting: Emergency Medicine

## 2015-11-01 ENCOUNTER — Encounter (HOSPITAL_COMMUNITY): Payer: Self-pay | Admitting: *Deleted

## 2015-11-01 ENCOUNTER — Encounter (HOSPITAL_COMMUNITY)
Admission: RE | Disposition: A | Discharge status: Skilled Nursing Facility | Payer: Self-pay | Source: Ambulatory Visit | Attending: Orthopedic Surgery

## 2015-11-01 ENCOUNTER — Inpatient Hospital Stay (HOSPITAL_COMMUNITY)
Admission: RE | Admit: 2015-11-01 | Discharge: 2015-11-05 | DRG: 466 | Disposition: A | Discharge status: Skilled Nursing Facility | Payer: Medicare Other | Source: Ambulatory Visit | Attending: Orthopedic Surgery | Admitting: Orthopedic Surgery

## 2015-11-01 DIAGNOSIS — T84018A Broken internal joint prosthesis, other site, initial encounter: Secondary | ICD-10-CM

## 2015-11-01 DIAGNOSIS — Z96649 Presence of unspecified artificial hip joint: Secondary | ICD-10-CM

## 2015-11-01 DIAGNOSIS — M25559 Pain in unspecified hip: Secondary | ICD-10-CM | POA: Diagnosis not present

## 2015-11-01 DIAGNOSIS — M069 Rheumatoid arthritis, unspecified: Secondary | ICD-10-CM | POA: Diagnosis present

## 2015-11-01 DIAGNOSIS — I10 Essential (primary) hypertension: Secondary | ICD-10-CM | POA: Diagnosis not present

## 2015-11-01 DIAGNOSIS — R0602 Shortness of breath: Secondary | ICD-10-CM | POA: Diagnosis not present

## 2015-11-01 DIAGNOSIS — K5900 Constipation, unspecified: Secondary | ICD-10-CM | POA: Diagnosis present

## 2015-11-01 DIAGNOSIS — D62 Acute posthemorrhagic anemia: Secondary | ICD-10-CM | POA: Diagnosis not present

## 2015-11-01 DIAGNOSIS — R05 Cough: Secondary | ICD-10-CM | POA: Diagnosis not present

## 2015-11-01 DIAGNOSIS — I499 Cardiac arrhythmia, unspecified: Secondary | ICD-10-CM | POA: Diagnosis present

## 2015-11-01 DIAGNOSIS — F419 Anxiety disorder, unspecified: Secondary | ICD-10-CM | POA: Diagnosis not present

## 2015-11-01 DIAGNOSIS — J9811 Atelectasis: Secondary | ICD-10-CM | POA: Diagnosis not present

## 2015-11-01 DIAGNOSIS — M25552 Pain in left hip: Secondary | ICD-10-CM | POA: Diagnosis present

## 2015-11-01 DIAGNOSIS — R262 Difficulty in walking, not elsewhere classified: Secondary | ICD-10-CM | POA: Diagnosis not present

## 2015-11-01 DIAGNOSIS — Z419 Encounter for procedure for purposes other than remedying health state, unspecified: Secondary | ICD-10-CM

## 2015-11-01 DIAGNOSIS — M84459A Pathological fracture, hip, unspecified, initial encounter for fracture: Secondary | ICD-10-CM | POA: Diagnosis present

## 2015-11-01 DIAGNOSIS — E271 Primary adrenocortical insufficiency: Secondary | ICD-10-CM | POA: Diagnosis present

## 2015-11-01 DIAGNOSIS — E785 Hyperlipidemia, unspecified: Secondary | ICD-10-CM | POA: Diagnosis present

## 2015-11-01 DIAGNOSIS — Z79899 Other long term (current) drug therapy: Secondary | ICD-10-CM

## 2015-11-01 DIAGNOSIS — M6281 Muscle weakness (generalized): Secondary | ICD-10-CM | POA: Diagnosis not present

## 2015-11-01 DIAGNOSIS — T84091A Other mechanical complication of internal left hip prosthesis, initial encounter: Principal | ICD-10-CM | POA: Diagnosis present

## 2015-11-01 DIAGNOSIS — R9389 Abnormal findings on diagnostic imaging of other specified body structures: Secondary | ICD-10-CM

## 2015-11-01 DIAGNOSIS — Z471 Aftercare following joint replacement surgery: Secondary | ICD-10-CM | POA: Diagnosis not present

## 2015-11-01 DIAGNOSIS — E876 Hypokalemia: Secondary | ICD-10-CM | POA: Diagnosis not present

## 2015-11-01 DIAGNOSIS — Z96651 Presence of right artificial knee joint: Secondary | ICD-10-CM | POA: Diagnosis present

## 2015-11-01 DIAGNOSIS — I959 Hypotension, unspecified: Secondary | ICD-10-CM | POA: Diagnosis not present

## 2015-11-01 DIAGNOSIS — Y792 Prosthetic and other implants, materials and accessory orthopedic devices associated with adverse incidents: Secondary | ICD-10-CM | POA: Diagnosis present

## 2015-11-01 DIAGNOSIS — K219 Gastro-esophageal reflux disease without esophagitis: Secondary | ICD-10-CM | POA: Diagnosis present

## 2015-11-01 DIAGNOSIS — Z96642 Presence of left artificial hip joint: Secondary | ICD-10-CM | POA: Diagnosis not present

## 2015-11-01 DIAGNOSIS — T84031D Mechanical loosening of internal left hip prosthetic joint, subsequent encounter: Secondary | ICD-10-CM | POA: Diagnosis not present

## 2015-11-01 DIAGNOSIS — Z09 Encounter for follow-up examination after completed treatment for conditions other than malignant neoplasm: Secondary | ICD-10-CM

## 2015-11-01 DIAGNOSIS — M160 Bilateral primary osteoarthritis of hip: Secondary | ICD-10-CM | POA: Diagnosis not present

## 2015-11-01 DIAGNOSIS — R509 Fever, unspecified: Secondary | ICD-10-CM

## 2015-11-01 HISTORY — PX: TOTAL HIP REVISION: SHX763

## 2015-11-01 HISTORY — PX: TOTAL HIP ARTHROPLASTY: SHX124

## 2015-11-01 LAB — CBC
HCT: 29.4 % — ABNORMAL LOW (ref 39.0–52.0)
HEMOGLOBIN: 9.8 g/dL — AB (ref 13.0–17.0)
MCH: 28.8 pg (ref 26.0–34.0)
MCHC: 33.3 g/dL (ref 30.0–36.0)
MCV: 86.5 fL (ref 78.0–100.0)
PLATELETS: 208 10*3/uL (ref 150–400)
RBC: 3.4 MIL/uL — AB (ref 4.22–5.81)
RDW: 14.3 % (ref 11.5–15.5)
WBC: 9.9 10*3/uL (ref 4.0–10.5)

## 2015-11-01 LAB — PREPARE RBC (CROSSMATCH)

## 2015-11-01 SURGERY — TOTAL HIP REVISION
Anesthesia: General | Site: Hip | Laterality: Left

## 2015-11-01 MED ORDER — FENTANYL CITRATE (PF) 250 MCG/5ML IJ SOLN
INTRAMUSCULAR | Status: AC
  Filled 2015-11-01: qty 5

## 2015-11-01 MED ORDER — PHENYLEPHRINE HCL 10 MG/ML IJ SOLN
10.0000 mg | INTRAMUSCULAR | Status: DC | PRN
  Administered 2015-11-01: 20:00:00 via INTRAVENOUS
  Administered 2015-11-01: 25 ug/min via INTRAVENOUS

## 2015-11-01 MED ORDER — PROPOFOL 10 MG/ML IV BOLUS
INTRAVENOUS | Status: AC
  Filled 2015-11-01: qty 20

## 2015-11-01 MED ORDER — ROCURONIUM BROMIDE 50 MG/5ML IV SOLN
INTRAVENOUS | Status: AC
  Filled 2015-11-01: qty 5

## 2015-11-01 MED ORDER — LIDOCAINE HCL (CARDIAC) 20 MG/ML IV SOLN
INTRAVENOUS | Status: DC | PRN
  Administered 2015-11-01: 100 mg via INTRAVENOUS

## 2015-11-01 MED ORDER — HYDROMORPHONE HCL 1 MG/ML IJ SOLN
0.2500 mg | INTRAMUSCULAR | Status: DC | PRN
  Administered 2015-11-01: 0.5 mg via INTRAVENOUS

## 2015-11-01 MED ORDER — BUPIVACAINE-EPINEPHRINE (PF) 0.5% -1:200000 IJ SOLN
INTRAMUSCULAR | Status: AC
  Filled 2015-11-01: qty 30

## 2015-11-01 MED ORDER — CEFAZOLIN SODIUM 1 G IJ SOLR
INTRAMUSCULAR | Status: AC
  Filled 2015-11-01: qty 20

## 2015-11-01 MED ORDER — POVIDONE-IODINE 10 % EX SWAB: 2.0000 "application " | Freq: Once | CUTANEOUS | Status: DC

## 2015-11-01 MED ORDER — PHENYLEPHRINE HCL 10 MG/ML IJ SOLN
INTRAMUSCULAR | Status: AC
  Filled 2015-11-01: qty 1

## 2015-11-01 MED ORDER — ONDANSETRON HCL 4 MG/2ML IJ SOLN
INTRAMUSCULAR | Status: DC | PRN
  Administered 2015-11-01: 4 mg via INTRAVENOUS

## 2015-11-01 MED ORDER — SUCCINYLCHOLINE CHLORIDE 20 MG/ML IJ SOLN
INTRAMUSCULAR | Status: DC | PRN
  Administered 2015-11-01: 80 mg via INTRAVENOUS

## 2015-11-01 MED ORDER — ONDANSETRON HCL 4 MG/2ML IJ SOLN
INTRAMUSCULAR | Status: AC
  Filled 2015-11-01: qty 6

## 2015-11-01 MED ORDER — KETOROLAC TROMETHAMINE 30 MG/ML IJ SOLN
INTRAMUSCULAR | Status: AC
  Filled 2015-11-01: qty 2

## 2015-11-01 MED ORDER — SUCCINYLCHOLINE CHLORIDE 200 MG/10ML IV SOSY
PREFILLED_SYRINGE | INTRAVENOUS | Status: AC
  Filled 2015-11-01: qty 20

## 2015-11-01 MED ORDER — LIDOCAINE 2% (20 MG/ML) 5 ML SYRINGE
INTRAMUSCULAR | Status: AC
  Filled 2015-11-01: qty 10

## 2015-11-01 MED ORDER — GLYCOPYRROLATE 0.2 MG/ML IV SOSY
PREFILLED_SYRINGE | INTRAVENOUS | Status: AC
  Filled 2015-11-01: qty 3

## 2015-11-01 MED ORDER — MEPERIDINE HCL 25 MG/ML IJ SOLN: 6.2500 mg | INTRAMUSCULAR | Status: DC | PRN

## 2015-11-01 MED ORDER — SODIUM CHLORIDE 0.9 % IV SOLN: 10.0000 mL/h | Freq: Once | INTRAVENOUS | Status: DC

## 2015-11-01 MED ORDER — LACTATED RINGERS IV SOLN
INTRAVENOUS | Status: DC
  Administered 2015-11-01 (×5): via INTRAVENOUS

## 2015-11-01 MED ORDER — PROMETHAZINE HCL 25 MG/ML IJ SOLN: 6.2500 mg | INTRAMUSCULAR | Status: DC | PRN

## 2015-11-01 MED ORDER — ALBUMIN HUMAN 5 % IV SOLN: 12.5000 g | Freq: Once | INTRAVENOUS | Status: DC

## 2015-11-01 MED ORDER — FENTANYL CITRATE (PF) 100 MCG/2ML IJ SOLN
INTRAMUSCULAR | Status: DC | PRN
  Administered 2015-11-01 (×8): 50 ug via INTRAVENOUS

## 2015-11-01 MED ORDER — ALBUMIN HUMAN 5 % IV SOLN
INTRAVENOUS | Status: DC | PRN
  Administered 2015-11-01 (×2): via INTRAVENOUS

## 2015-11-01 MED ORDER — PROPOFOL 10 MG/ML IV BOLUS
INTRAVENOUS | Status: DC | PRN
  Administered 2015-11-01: 140 mg via INTRAVENOUS

## 2015-11-01 MED ORDER — SODIUM CHLORIDE 0.9 % IJ SOLN
INTRAMUSCULAR | Status: DC | PRN
Start: 2015-11-01 — End: 2015-11-01
  Administered 2015-11-01: 30 mL via INTRAVENOUS

## 2015-11-01 MED ORDER — DEXAMETHASONE SODIUM PHOSPHATE 10 MG/ML IJ SOLN
INTRAMUSCULAR | Status: DC | PRN
  Administered 2015-11-01: 10 mg via INTRAVENOUS

## 2015-11-01 MED ORDER — HYDROCORTISONE NA SUCCINATE PF 250 MG IJ SOLR
INTRAMUSCULAR | Status: AC
  Filled 2015-11-01: qty 250

## 2015-11-01 MED ORDER — DEXAMETHASONE SODIUM PHOSPHATE 10 MG/ML IJ SOLN
INTRAMUSCULAR | Status: AC
  Filled 2015-11-01: qty 3

## 2015-11-01 MED ORDER — ALBUMIN HUMAN 5 % IV SOLN
INTRAVENOUS | Status: AC
  Administered 2015-11-02: 01:00:00
  Filled 2015-11-01: qty 250

## 2015-11-01 MED ORDER — KETOROLAC TROMETHAMINE 30 MG/ML IJ SOLN
INTRAMUSCULAR | Status: DC | PRN
  Administered 2015-11-01: 30 mg via INTRAVENOUS

## 2015-11-01 MED ORDER — HYDROCORTISONE NA SUCCINATE PF 100 MG IJ SOLR
INTRAMUSCULAR | Status: DC | PRN
  Administered 2015-11-01: 100 mg via INTRAVENOUS

## 2015-11-01 MED ORDER — BUPIVACAINE-EPINEPHRINE 0.5% -1:200000 IJ SOLN: INTRAMUSCULAR | Status: DC | PRN

## 2015-11-01 MED ORDER — GLYCOPYRROLATE 0.2 MG/ML IJ SOLN
INTRAMUSCULAR | Status: DC | PRN
  Administered 2015-11-01 (×2): 0.2 mg via INTRAVENOUS

## 2015-11-01 MED ORDER — HYDROMORPHONE HCL 1 MG/ML IJ SOLN
INTRAMUSCULAR | Status: AC
  Filled 2015-11-01: qty 1

## 2015-11-01 MED ORDER — MIDAZOLAM HCL 5 MG/5ML IJ SOLN
INTRAMUSCULAR | Status: DC | PRN
  Administered 2015-11-01: 2 mg via INTRAVENOUS

## 2015-11-01 MED ORDER — HYDROCORTISONE NA SUCCINATE PF 100 MG IJ SOLR
50.0000 mg | Freq: Three times a day (TID) | INTRAMUSCULAR | Status: DC
  Administered 2015-11-02 (×2): 50 mg via INTRAVENOUS
  Filled 2015-11-01 (×3): qty 1

## 2015-11-01 MED ORDER — ROCURONIUM BROMIDE 100 MG/10ML IV SOLN
INTRAVENOUS | Status: DC | PRN
  Administered 2015-11-01 (×2): 20 mg via INTRAVENOUS
  Administered 2015-11-01: 10 mg via INTRAVENOUS
  Administered 2015-11-01: 50 mg via INTRAVENOUS

## 2015-11-01 MED ORDER — MIDAZOLAM HCL 2 MG/2ML IJ SOLN
INTRAMUSCULAR | Status: AC
  Filled 2015-11-01: qty 2

## 2015-11-01 MED ORDER — PHENYLEPHRINE 40 MCG/ML (10ML) SYRINGE FOR IV PUSH (FOR BLOOD PRESSURE SUPPORT)
PREFILLED_SYRINGE | INTRAVENOUS | Status: AC
  Filled 2015-11-01: qty 10

## 2015-11-01 MED ORDER — 0.9 % SODIUM CHLORIDE (POUR BTL) OPTIME
TOPICAL | Status: DC | PRN
  Administered 2015-11-01: 1000 mL

## 2015-11-01 SURGICAL SUPPLY — 93 items
ADH SKN CLS APL DERMABOND .7 (GAUZE/BANDAGES/DRESSINGS) ×6
BAG DECANTER FOR FLEXI CONT (MISCELLANEOUS) ×2 IMPLANT
BIT DRILL 4.3 (BIT) IMPLANT
BIT DRILL QC 3.3X195 (BIT) ×1 IMPLANT
BIT DRILL RINGLOC 3.2MMX40 (BIT) IMPLANT
BIT DRILL RINGLOC 3.2X40 (BIT) ×1
BLADE SAW SGTL 18X1.27X75 (BLADE) ×2 IMPLANT
BONE CANC CHIPS 40CC CAN1/2 (Bone Implant) ×2 IMPLANT
BONE CEMENT PALACOS R-G (Orthopedic Implant) ×4 IMPLANT
BUR ROUND PRECISION 4.0 (BURR) ×1 IMPLANT
CABLE CERLAGE W/CRIMP 1.8 (Cable) ×4 IMPLANT
CEMENT BONE PALACOS R-G (Orthopedic Implant) IMPLANT
CHIPS CANC BONE 40CC CAN1/2 (Bone Implant) ×1 IMPLANT
COVER SURGICAL LIGHT HANDLE (MISCELLANEOUS) ×3 IMPLANT
DERMABOND ADVANCED (GAUZE/BANDAGES/DRESSINGS) ×6
DERMABOND ADVANCED .7 DNX12 (GAUZE/BANDAGES/DRESSINGS) IMPLANT
DRAPE HIP W/POCKET STRL (DRAPE) ×2 IMPLANT
DRAPE INCISE IOBAN 66X45 STRL (DRAPES) ×2 IMPLANT
DRAPE INCISE IOBAN 85X60 (DRAPES) ×2 IMPLANT
DRAPE POUCH INSTRU U-SHP 10X18 (DRAPES) ×2 IMPLANT
DRAPE SURG 17X11 SM STRL (DRAPES) ×2 IMPLANT
DRAPE U-SHAPE 47X51 STRL (DRAPES) ×2 IMPLANT
DRILL BIT 4.3 (BIT) ×4
DRILL BIT RINGLOC 3.2MMX40 (BIT) ×2
DRSG AQUACEL AG ADV 3.5X10 (GAUZE/BANDAGES/DRESSINGS) ×2 IMPLANT
DRSG AQUACEL AG ADV 3.5X14 (GAUZE/BANDAGES/DRESSINGS) ×2 IMPLANT
DRSG MEPILEX BORDER 4X4 (GAUZE/BANDAGES/DRESSINGS) ×1 IMPLANT
DURAPREP 26ML APPLICATOR (WOUND CARE) ×2 IMPLANT
ELECT BLADE TIP CTD 4 INCH (ELECTRODE) ×2 IMPLANT
ELECT REM PT RETURN 9FT ADLT (ELECTROSURGICAL) ×2
ELECTRODE REM PT RTRN 9FT ADLT (ELECTROSURGICAL) ×1 IMPLANT
EVACUATOR 1/8 PVC DRAIN (DRAIN) ×1 IMPLANT
FACESHIELD WRAPAROUND (MASK) ×8 IMPLANT
FACESHIELD WRAPAROUND OR TEAM (MASK) ×4 IMPLANT
FEMORAL MOD ARCOS 70MM SZB (Screw) ×1 IMPLANT
GLOVE BIO SURGEON STRL SZ8.5 (GLOVE) ×2 IMPLANT
GLOVE BIOGEL PI IND STRL 8.5 (GLOVE) ×2 IMPLANT
GLOVE BIOGEL PI INDICATOR 8.5 (GLOVE) ×2
GLOVE SURG SS PI 8.0 STRL IVOR (GLOVE) ×4 IMPLANT
GOWN SPEC L3 XXLG W/TWL (GOWN DISPOSABLE) ×4 IMPLANT
GOWN STRL REUS W/TWL 2XL LVL3 (GOWN DISPOSABLE) ×3 IMPLANT
GRAFT BNE CHIP CANC 1-8 40 (Bone Implant) IMPLANT
GUIDEWIRE BALL NOSE 80CM (WIRE) ×1 IMPLANT
HANDPIECE INTERPULSE COAX TIP (DISPOSABLE) ×2
HEAD FEM -3XOFST 36XMDLR (Head) IMPLANT
HEAD MODULAR 36MM (Head) ×2 IMPLANT
K-WIRE 2.0 (WIRE) ×2
K-WIRE FXSTD 280X2XNS SS (WIRE) ×1
KIT BASIN OR (CUSTOM PROCEDURE TRAY) ×2 IMPLANT
KWIRE FXSTD 280X2XNS SS (WIRE) IMPLANT
LINER ACETAB MOD 36X66MM 10D (Screw) ×1 IMPLANT
LIQUID BAND (GAUZE/BANDAGES/DRESSINGS) ×2 IMPLANT
LOCKPLATE CABLE BUTTON NCP HIP (Orthopedic Implant) ×3 IMPLANT
MANIFOLD NEPTUNE II (INSTRUMENTS) ×2 IMPLANT
NDL SAFETY ECLIPSE 18X1.5 (NEEDLE) ×1 IMPLANT
NEEDLE HYPO 18GX1.5 SHARP (NEEDLE) ×2
NS IRRIG 1000ML POUR BTL (IV SOLUTION) ×2 IMPLANT
PACK TOTAL JOINT (CUSTOM PROCEDURE TRAY) ×2 IMPLANT
PLATE NCB 15H HIP (Plate) ×1 IMPLANT
SCREW 5.0 80MM (Screw) ×1 IMPLANT
SCREW BONE STAP 6.5MM 30MM (Screw) ×2 IMPLANT
SCREW CANC HGP II 6.5X20MM (Screw) ×1 IMPLANT
SCREW CANC HGP II 6.5X50MM (Screw) ×1 IMPLANT
SCREW CANC ST HGP II 6.5X40MM (Screw) ×1 IMPLANT
SCREW CANC ST HGP II 6.5X60MM (Screw) ×2 IMPLANT
SCREW CORTICAL NCB 5.0X65 (Screw) ×1 IMPLANT
SCREW NCB 3.5X75X5X6.2XST (Screw) IMPLANT
SCREW NCB 5.0X30MM (Screw) ×1 IMPLANT
SCREW NCB 5.0X34MM (Screw) ×1 IMPLANT
SCREW NCB 5.0X75MM (Screw) ×2 IMPLANT
SEALER BIPOLAR AQUA 6.0 (INSTRUMENTS) ×1 IMPLANT
SET HNDPC FAN SPRY TIP SCT (DISPOSABLE) IMPLANT
SHELL ACETABULAR TI 20X66MM (Cup) ×1 IMPLANT
SPONGE LAP 18X18 X RAY DECT (DISPOSABLE) ×1 IMPLANT
STAPLER VISISTAT (STAPLE) ×3 IMPLANT
STEM FEM ARCOS 13X190MM (Stem) ×1 IMPLANT
STEM FEM ARCOS 14X190MM (Stem) ×1 IMPLANT
SUCTION FRAZIER HANDLE 10FR (MISCELLANEOUS) ×1
SUCTION TUBE FRAZIER 10FR DISP (MISCELLANEOUS) ×1 IMPLANT
SUT ETHIBOND 2 V 37 (SUTURE) ×4 IMPLANT
SUT MNCRL AB 4-0 PS2 18 (SUTURE) ×2 IMPLANT
SUT MON AB 2-0 CT1 36 (SUTURE) ×8 IMPLANT
SUT VIC AB 1 CT1 27 (SUTURE) ×12
SUT VIC AB 1 CT1 27XBRD ANBCTR (SUTURE) ×1 IMPLANT
SUT VIC AB 2-0 CT1 27 (SUTURE) ×2
SUT VIC AB 2-0 CT1 TAPERPNT 27 (SUTURE) ×1 IMPLANT
SUT VLOC 180 0 24IN GS25 (SUTURE) ×2 IMPLANT
SYR 50ML LL SCALE MARK (SYRINGE) ×2 IMPLANT
TOWEL OR 17X26 10 PK STRL BLUE (TOWEL DISPOSABLE) ×4 IMPLANT
TRABECULAR ACE REVISION 58MM ×1 IMPLANT
TRAY CATH 16FR W/PLASTIC CATH (SET/KITS/TRAYS/PACK) IMPLANT
TRAY FOLEY CATH 16FRSI W/METER (SET/KITS/TRAYS/PACK) IMPLANT
YANKAUER SUCT BULB TIP NO VENT (SUCTIONS) ×1 IMPLANT

## 2015-11-01 NOTE — Brief Op Note (Signed)
11/01/2015  10:06 PM  PATIENT:  John Lyons  70 y.o. male  PRE-OPERATIVE DIAGNOSIS:  failed left total hip arthroplasty  POST-OPERATIVE DIAGNOSIS:  failed left total hip arthroplasty  PROCEDURE:  Procedure(s) with comments: REVISION OF LEFT TOTAL HIP ARTHROPLASTY WITH BOTH ACETABULAR AND FEMORAL COMPONENTS POSTERIOR APPROACH (Left) - Requesting RNFA  SURGEON:  Surgeon(s) and Role:    * Rod Can, MD - Primary  PHYSICIAN ASSISTANT: none  ASSISTANTS: Ky Barban, RNFA   ANESTHESIA:   general  EBL:  Total I/O In: 2205 [I.V.:1400; Blood:555; IV Piggyback:250] Out: 450 [Urine:200; Blood:250]  BLOOD ADMINISTERED: 2 units PRBCs  DRAINS: (1 medium) Hemovact drain(s) in the left hip with  Suction Open   LOCAL MEDICATIONS USED:  NONE  SPECIMEN:  Source of Specimen:  left hip fluid  DISPOSITION OF SPECIMEN:  micro  COUNTS:  YES  TOURNIQUET:  * No tourniquets in log *  DICTATION: .Other Dictation: Dictation Number [id number]  PLAN OF CARE: Admit to inpatient   PATIENT DISPOSITION:  PACU - hemodynamically stable.   Delay start of Pharmacological VTE agent (>24hrs) due to surgical blood loss or risk of bleeding: no

## 2015-11-01 NOTE — Anesthesia Preprocedure Evaluation (Addendum)
Anesthesia Evaluation  Patient identified by MRN, date of birth, ID band Patient awake    Reviewed: Allergy & Precautions, NPO status , Patient's Chart, lab work & pertinent test results  History of Anesthesia Complications (+) PONV and history of anesthetic complications  Airway Mallampati: III  TM Distance: <3 FB Neck ROM: Full    Dental no notable dental hx.    Pulmonary neg pulmonary ROS,    Pulmonary exam normal breath sounds clear to auscultation       Cardiovascular hypertension, Pt. on medications Normal cardiovascular exam+ dysrhythmias  Rhythm:Regular Rate:Normal     Neuro/Psych PSYCHIATRIC DISORDERS Anxiety negative neurological ROS     GI/Hepatic Neg liver ROS, hiatal hernia, GERD  Medicated,  Endo/Other  Addison's disease  Renal/GU negative Renal ROS     Musculoskeletal  (+) Arthritis , Rheumatoid disorders,    Abdominal   Peds  Hematology negative hematology ROS (+)   Anesthesia Other Findings H/o Addison's Disease  Reproductive/Obstetrics negative OB ROS                           Anesthesia Physical  Anesthesia Plan  ASA: III  Anesthesia Plan: General   Post-op Pain Management:    Induction: Intravenous  Airway Management Planned: Oral ETT  Additional Equipment:   Intra-op Plan:   Post-operative Plan: Extubation in OR  Informed Consent: I have reviewed the patients History and Physical, chart, labs and discussed the procedure including the risks, benefits and alternatives for the proposed anesthesia with the patient or authorized representative who has indicated his/her understanding and acceptance.   Dental advisory given  Plan Discussed with: CRNA and Surgeon  Anesthesia Plan Comments:         Anesthesia Quick Evaluation

## 2015-11-01 NOTE — Transfer of Care (Signed)
Immediate Anesthesia Transfer of Care Note  Patient: John Lyons  Procedure(s) Performed: Procedure(s) with comments: REVISION OF LEFT TOTAL HIP ARTHROPLASTY WITH BOTH ACETABULAR AND FEMORAL COMPONENTS POSTERIOR APPROACH (Left) - Requesting RNFA  Patient Location: PACU  Anesthesia Type:General  Level of Consciousness: awake, alert  and oriented  Airway & Oxygen Therapy: Patient connected to face mask oxygen  Post-op Assessment: Report given to RN, Post -op Vital signs reviewed and stable and Patient moving all extremities X 4  Post vital signs: Reviewed and stable  Last Vitals:  Filed Vitals:   11/01/15 1011  BP: 175/61  Pulse: 72  Resp: 20    Last Pain: There were no vitals filed for this visit.       Complications: No apparent anesthesia complications

## 2015-11-01 NOTE — Anesthesia Procedure Notes (Signed)
Procedure Name: Intubation Date/Time: 11/01/2015 1:27 PM Performed by: Clearnce Sorrel Pre-anesthesia Checklist: Patient identified, Emergency Drugs available, Suction available, Patient being monitored and Timeout performed Patient Re-evaluated:Patient Re-evaluated prior to inductionOxygen Delivery Method: Circle system utilized Preoxygenation: Pre-oxygenation with 100% oxygen Intubation Type: IV induction Ventilation: Mask ventilation without difficulty Laryngoscope Size: Glidescope and 3 Grade View: Grade I Tube type: Oral Tube size: 7.5 mm Number of attempts: 1 Airway Equipment and Method: Stylet Placement Confirmation: ETT inserted through vocal cords under direct vision,  positive ETCO2 and breath sounds checked- equal and bilateral Secured at: 22 cm Tube secured with: Tape Dental Injury: Teeth and Oropharynx as per pre-operative assessment  Difficulty Due To: Difficulty was anticipated

## 2015-11-01 NOTE — H&P (View-Only) (Signed)
TOTAL HIP REVISION ADMISSION H&P  Patient is admitted for left revision total hip arthroplasty.  Subjective:  Chief Complaint: left hip pain  HPI: John Lyons, 70 y.o. male, has a history of pain and functional disability in the left hip due to arthritis and patient has failed non-surgical conservative treatments for greater than 12 weeks to include flexibility and strengthening excercises, use of assistive devices, weight reduction as appropriate and activity modification. The indications for the revision total hip arthroplasty are loosening of one or more components, fracture or mechanical failure of one or more component and progressive or substantial periprosthetic bone loss.  Onset of symptoms was gradual starting >10 years ago with gradually worsening course since that time.  Prior procedures on the left hip include arthroplasty.  Patient currently rates pain in the left hip at 10 out of 10 with activity.  There is night pain, worsening of pain with activity and weight bearing, trendelenberg gait, pain that interfers with activities of daily living, pain with passive range of motion and crepitus. Patient has evidence of prosthetic loosening and massive acetabulr bone loss by imaging studies.  This condition presents safety issues increasing the risk of falls.  There is no current active infection.  Patient Active Problem List   Diagnosis Date Noted  . Acute calculous cholecystitis   . Hypokalemia   . Abdominal pain 08/11/2015  . Addison's disease ([place]) 08/11/2015  . Spinal stenosis 08/11/2015  . Rheumatoid arthritis ([place]) 08/11/2015  . Diverticulosis 08/11/2015  . Fever 08/11/2015  . Pyrexia   . Midline low back pain without sciatica   . Essential hypertension 07/13/2015   Past Medical History  Diagnosis Date  . Addison's disease ([place])   . PONV (postoperative nausea and vomiting)   . Rheumatoid arthritis(714.0)   . Hypertension   . H/O hiatal hernia   . GERD (gastroesophageal  reflux disease)   . Dysrhythmia     hx rapid heart rate in 70s, on digoxin  . Osteoporosis   . Cholecystitis, acute 07/2015    Past Surgical History  Procedure Laterality Date  . Total knee arthroplasty  1984    Left   . Total hip arthroplasty  1979    bilat  . Eye surgery      cataracts  . Cardiovascular stress test  01/04/12    Dr. Gwenlyn Found - [facility]  . Total knee arthroplasty  02/12/2012    right  . Total knee arthroplasty  02/12/2012    Procedure: TOTAL KNEE ARTHROPLASTY;  Surgeon: Rudean Haskell, MD;  Location: [facility];  Service: Orthopedics;  Laterality: Right;  . Vasectomy  1979  . Cholecystectomy N/A 08/12/2015    Procedure: LAPAROSCOPIC CHOLECYSTECTOMY;  Surgeon: Mickeal Skinner, MD;  Location: [facility];  Service: General;  Laterality: N/A;     (Not in a hospital admission) Allergies  Allergen Reactions  . Adhesive [Tape]     Rash    . Phenobarbital Rash    Social History  Substance Use Topics  . Smoking status: Never Smoker   . Smokeless tobacco: Never Used  . Alcohol Use: No    No family history on file.    Review of Systems  Constitutional: Negative.   HENT: Negative.   Eyes: Negative.   Respiratory: Negative.   Cardiovascular: Positive for leg swelling.  Gastrointestinal: Positive for constipation.  Genitourinary: Negative.   Musculoskeletal: Positive for joint pain.  Skin: Negative.   Neurological: Negative.   Endo/Heme/Allergies: Positive for environmental allergies.  Psychiatric/Behavioral: Negative.     Objective:  Physical Exam  Constitutional: He is oriented to person, place, and time. He appears well-developed and well-nourished.  HENT:  Head: Normocephalic and atraumatic.  Eyes: Conjunctivae and EOM are normal. Pupils are equal, round, and reactive to light.  Neck: Normal range of motion. Neck supple.  Cardiovascular: Normal rate and regular rhythm.   Respiratory: Effort normal and breath sounds normal.  GI: Soft. Bowel  sounds are normal. He exhibits no distension.  Genitourinary:  deferred  Musculoskeletal:       Left hip: He exhibits decreased range of motion, decreased strength and crepitus.  Neurological: He is alert and oriented to person, place, and time. He has normal reflexes.  Skin: Skin is dry.  Psychiatric: He has a normal mood and affect. His behavior is normal. Judgment and thought content normal.    Vital signs in last 24 hours: @VSRANGES @   Labs:   Estimated body mass index is 23.03 kg/(m^2) as calculated from the following:   Height as of 08/10/15: 5\' 3"  (1.6 m).   Weight as of 08/10/15: 58.968 kg (130 lb).  Imaging Review:  Plain radiographs demonstrate failed left total hip arthroplasty.  There is evidence of loosening of the acetabular cup and femoral stem.The bone quality appears to be poor for age and reported activity level. There is massive acetabular bone loss with Paprosky 3A defect.  Assessment/Plan:  End stage arthritis, left hip(s) with failed previous arthroplasty.  The patient history, physical examination, clinical judgement of the provider and imaging studies are consistent with end stage degenerative joint disease of the left hip(s), previous total hip arthroplasty. Revision total hip arthroplasty is deemed medically necessary. The treatment options including medical management, injection therapy, arthroscopy and arthroplasty were discussed at length. The risks and benefits of total hip arthroplasty were presented and reviewed. The risks due to aseptic loosening, infection, stiffness, dislocation/subluxation,  thromboembolic complications and other imponderables were discussed.  The patient acknowledged the explanation, agreed to proceed with the plan and consent was signed. Patient is being admitted for inpatient treatment for surgery, pain control, PT, OT, prophylactic antibiotics, VTE prophylaxis, progressive ambulation and ADL's and discharge planning. The patient is  planning to be discharged to skilled nursing facility

## 2015-11-01 NOTE — Interval H&P Note (Signed)
History and Physical Interval Note:  11/01/2015 11:59 AM  John Lyons  has presented today for surgery, with the diagnosis of failed left total hip arthroplasty  The various methods of treatment have been discussed with the patient and family. After consideration of risks, benefits and other options for treatment, the patient has consented to  Procedure(s) with comments: [place] (Left) - Requesting RNFA as a surgical intervention .  The patient's history has been reviewed, patient examined, no change in status, stable for surgery.  I have reviewed the patient's chart and labs.  Questions were answered to the patient's satisfaction.     Rubylee Zamarripa, Horald Pollen

## 2015-11-01 NOTE — H&P (Signed)
Medical Consultation   John Lyons  [id number]  DOB: 1945/07/18  DOA: 11/01/2015  PCP: [name], MD    Requesting physician: Ortho, Dr. Lillia Corporal  Reason for consultation: to help in managing chronic medical problems including Addison's disease.   History of Present Illness:  John Lyons is an 70 y.o. male hypertension, hyperlipidemia, GERD, anxiety, Addison's disease, rheumatoid arthritis, who is admitted by ortho, Dr. Lillia Corporal for revision of left total hip arthroplasty surgery.   Pt has a failed left total hip arthroplasty and continue have pain. Today, he is admitted by ortho, Dr. Lillia Corporal for revision of left total hip arthroplasty surgery. Per report,  pt underwent left hip surgery, and proximately lost 1400 mL of blood. He had one episode of hypotension with blood pressure 65/35, which responded to the IV fluid treatment quickly and improved to 121/46. When I saw pt in PACU, he reports morderate pain in left hip and upper thigh. Also reports chronic moderate right shoulder pain. He denies nausea, vomiting, diarrhea, abdominal pain, chest pain, shortness of breath, fever or chills. No symptoms of a UTI. Digoxin is on his med list. Pt states that he has been taking this med for rapid heart beats since 1978, but dose not know the clear diagnosis.  Review of Systems:  Review of Systems  Constitutional: Negative for fever, chills, weight loss, malaise/fatigue and diaphoresis.  HENT: Negative for ear discharge, ear pain, hearing loss, nosebleeds, sore throat and tinnitus.   Eyes: Negative for blurred vision, double vision, photophobia, pain, discharge and redness.  Respiratory: Negative for cough, hemoptysis, sputum production, shortness of breath, wheezing and stridor.   Cardiovascular: Negative for chest pain, palpitations, orthopnea, claudication, leg swelling and PND.  Gastrointestinal: Negative for heartburn, nausea, vomiting, abdominal pain, diarrhea,  constipation, blood in stool and melena.  Genitourinary: Negative for dysuria, urgency, frequency, hematuria and flank pain.  Musculoskeletal: Positive for joint pain. Negative for myalgias, back pain, falls and neck pain.  Skin: Negative for itching and rash.  Neurological: Negative.  Negative for weakness and headaches.  Endo/Heme/Allergies: Negative.   Psychiatric/Behavioral: Negative.     Past Medical History: Past Medical History  Diagnosis Date  . Addison's disease ([place])     takes Florinef and Cortef daily  . PONV (postoperative nausea and vomiting)   . H/O hiatal hernia   . Constipation     takes Linzess daily and Miralax as needed as well as Colace  . Dysrhythmia     takes Digoxin daily  . Anxiety     takes Xanax at bedtime  . GERD (gastroesophageal reflux disease)     takes Pantoprazole daily  . Hyperlipidemia     takes Pravastatin daily  . Hypertension     takes Amlodipine and Micardis daily  . Weakness     right hand.Knot on right hand,states came up 2 wks ago,=  . Joint pain   . Joint swelling   . Rheumatoid arthritis(714.0)     Rheumatoid-takes Reclast yearly and Plaquenil daily  . Dry skin     on legs  . History of colon polyps     benign  . Diverticulosis   . History of blood transfusion     Past Surgical History: Past Surgical History  Procedure Laterality Date  . Total knee arthroplasty  1984    Left   . Total hip arthroplasty  1978/1979    bilat  . Eye surgery  cataracts  . Cardiovascular stress test  01/04/12    Dr. Gwenlyn Found - [facility]  . Total knee arthroplasty  02/12/2012    right  . Total knee arthroplasty  02/12/2012    Procedure: TOTAL KNEE ARTHROPLASTY;  Surgeon: Rudean Haskell, MD;  Location: [place];  Service: Orthopedics;  Laterality: Right;  . Vasectomy  1979  . Cholecystectomy N/A 08/12/2015    Procedure: LAPAROSCOPIC CHOLECYSTECTOMY;  Surgeon: Mickeal Skinner, MD;  Location: [place];  Service: General;   Laterality: N/A;  . Colonoscopy with esophagogastroduodenoscopy (egd) and esophageal dilation (ed)    . Colonoscopy       Allergies:   Allergies  Allergen Reactions  . Adhesive [Tape] Rash  . Phenobarbital Rash     Social History:  reports that he has never smoked. He has never used smokeless tobacco. He reports that he does not drink alcohol or use illicit drugs.   Family History: Family History  Problem Relation Age of Onset  . Diabetes Mother   . Heart disease Mother   . Testicular cancer Father   . Rheumatic fever Brother     Physical Exam: Filed Vitals:   11/02/15 0010 11/02/15 0055 11/02/15 0120 11/02/15 0232  BP: 128/55 122/55 118/51 111/52  Pulse: 76 70 65 75  Temp: 97.7 F (36.5 C) 97.7 F (36.5 C) 97.7 F (36.5 C) 98.1 F (36.7 C)  TempSrc: Oral Oral Oral Oral  Resp: 18 20 17 16   Height:      Weight:      SpO2: 100% 100% 100% 100%   General: Not in acute distress HEENT: PERRL, EOMI, no scleral icterus, No JVD or bruit Cardiac: S1/S2, RRR, No murmurs, gallops or rubs Pulm: no rebound pain, no organomegaly, BS present Ext: No edema. 2+DP/PT pulse bilaterally Musculoskeletal: s/p of left hip surgery. Has tenderness over left hip and upper thigh. Skin: No rashes.  Neuro: Alert and oriented X3, cranial nerves II-XII grossly intact. Psych: Patient is not psychotic, no suicidal or hemocidal ideation.  Data reviewed:  I have personally reviewed following labs and imaging studies Labs:  CBC:  Recent Labs Lab 11/01/15 1316  WBC 9.9  HGB 9.8*  HCT 29.4*  MCV 86.5  PLT 123XX123    Basic Metabolic Panel: No results for input(s): NA, K, CL, CO2, GLUCOSE, BUN, CREATININE, CALCIUM, MG, PHOS in the last 168 hours. GFR Estimated Creatinine Clearance: 68.2 mL/min (by C-G formula based on Cr of 0.5). Liver Function Tests: No results for input(s): AST, ALT, ALKPHOS, BILITOT, PROT, ALBUMIN in the last 168 hours. No results for input(s): LIPASE, AMYLASE in the  last 168 hours. No results for input(s): AMMONIA in the last 168 hours. Coagulation profile No results for input(s): INR, PROTIME in the last 168 hours.  Cardiac Enzymes: No results for input(s): CKTOTAL, CKMB, CKMBINDEX, TROPONINI in the last 168 hours. BNP: Invalid input(s): POCBNP CBG: No results for input(s): GLUCAP in the last 168 hours. D-Dimer No results for input(s): DDIMER in the last 72 hours. Hgb A1c No results for input(s): HGBA1C in the last 72 hours. Lipid Profile No results for input(s): CHOL, HDL, LDLCALC, TRIG, CHOLHDL, LDLDIRECT in the last 72 hours. Thyroid function studies No results for input(s): TSH, T4TOTAL, T3FREE, THYROIDAB in the last 72 hours.  Invalid input(s): FREET3 Anemia work up No results for input(s): VITAMINB12, FOLATE, FERRITIN, TIBC, IRON, RETICCTPCT in the last 72 hours. Urinalysis    Component Value Date/Time   COLORURINE YELLOW 08/10/2015 1657  APPEARANCEUR CLEAR 08/10/2015 1657   LABSPEC 1.016 08/10/2015 1657   PHURINE 5.5 08/10/2015 1657   GLUCOSEU NEGATIVE 08/10/2015 1657   HGBUR NEGATIVE 08/10/2015 1657   BILIRUBINUR NEGATIVE 08/10/2015 1657   KETONESUR NEGATIVE 08/10/2015 1657   PROTEINUR NEGATIVE 08/10/2015 1657   UROBILINOGEN 0.2 02/02/2012 1135   NITRITE NEGATIVE 08/10/2015 [place] 08/10/2015 1657     Microbiology Recent Results (from the past 240 hour(s))  Aerobic/Anaerobic Culture (surgical/deep wound)     Status: None (Preliminary result)   Collection Time: 11/01/15  3:16 PM  Result Value Ref Range Status   Specimen Description SYNOVIAL LEFT HIP  Final   Special Requests FLUID ON SWAB PATIENT ON FOLLOWING ANCEF  Final   Gram Stain   Final    RARE WBC PRESENT, PREDOMINANTLY PMN NO ORGANISMS SEEN    Culture PENDING  Incomplete   Report Status PENDING  Incomplete       Inpatient Medications:   Scheduled Meds: . ALPRAZolam  0.25 mg Oral QHS  . amLODipine  2.5 mg Oral Daily  .  cholecalciferol  1,000 Units Oral Daily  . digoxin  0.125 mg Oral Daily  . docusate sodium  100 mg Oral BID  . enoxaparin (LOVENOX) injection  40 mg Subcutaneous Daily  . fludrocortisone  0.1 mg Oral Daily  . folic acid  1 mg Oral Daily  . [START ON 11/03/2015] hydrocortisone  20 mg Oral Daily  . hydrocortisone sod succinate (SOLU-CORTEF) inj  50 mg Intravenous Q8H  . HYDROmorphone      . hydroxychloroquine  200 mg Oral Daily  . irbesartan  300 mg Oral Daily  . ketorolac  7.5 mg Intravenous Q6H  . pantoprazole  40 mg Oral Daily  . pravastatin  20 mg Oral Daily  . senna  2 tablet Oral QHS   Continuous Infusions: . sodium chloride 150 mL/hr at 11/02/15 0109     Radiological Exams on Admission: Dg Chest 2 View  11/01/2015  CLINICAL DATA:  Preoperative total hip replacement. Cough and congestion EXAM: CHEST  2 VIEW COMPARISON:  August 11, 2015 FINDINGS: There is no edema or consolidation. The heart size and pulmonary vascularity are normal. No adenopathy. There is advanced arthropathy in both shoulders. IMPRESSION: No edema or consolidation. Electronically Signed   By: Lowella Grip III M.D.   On: 11/01/2015 10:59   Dg Pelvis Portable  11/02/2015  CLINICAL DATA:  Postoperative left hip revision. EXAM: PORTABLE PELVIS 1-2 VIEWS COMPARISON:  Intraoperative fluoroscopy 11/01/2015 FINDINGS: Postoperative left total hip arthroplasty using long-stem femoral component with additional acetabular prosthesis superiorly. Plate and screw fixation of the visualize left femoral shaft with cerclage wires present. Distal portion of the surgical hardware in the left femur is not included within the field of view. Visualize components appear well seated. Previous right total hip arthroplasty using cemented components. There is lucency at the bone-cement interface in the acetabular region which may suggest loosening. No dislocation. Visualized pelvis appears intact. IMPRESSION: Postoperative changes in both  hips as described. Electronically Signed   By: Lucienne Capers M.D.   On: 11/02/2015 00:24   Dg C-arm 61-120 Min  11/01/2015  CLINICAL DATA:  Left total hip arthroplasty with internal fixation left femoral fracture. EXAM: DG C-ARM 61-120 MIN; LEFT FEMUR 2 VIEWS COMPARISON:  Left hip 05/18/2015 FINDINGS: Intraoperative fluoroscopy is obtained for surgical control purposes during orthopedic procedure. Fluoroscopy time is recorded at 2 minutes 29 seconds. Seven spot fluoroscopic images are obtained. Spot fluoroscopic  images obtained of the left hip and left femur the demonstrate placement of a left total hip arthroplasty with long-stem femoral component and with additional acetabular prosthesis. Bone matrix of the femur is demineralized. Multiple cerclage wires are utilized. Lateral plate and screw fixation of the femoral shaft. Left knee arthroplasty. Visualized components appear grossly well-seated. IMPRESSION: Intraoperative fluoroscopy obtained for surgical control purposes demonstrating left hip and left knee arthroplasties with plate and screw fixation of the left femur. Electronically Signed   By: Lucienne Capers M.D.   On: 11/01/2015 21:37   Dg Femur Min 2 Views Left  11/01/2015  CLINICAL DATA:  Left total hip arthroplasty with internal fixation left femoral fracture. EXAM: DG C-ARM 61-120 MIN; LEFT FEMUR 2 VIEWS COMPARISON:  Left hip 05/18/2015 FINDINGS: Intraoperative fluoroscopy is obtained for surgical control purposes during orthopedic procedure. Fluoroscopy time is recorded at 2 minutes 29 seconds. Seven spot fluoroscopic images are obtained. Spot fluoroscopic images obtained of the left hip and left femur the demonstrate placement of a left total hip arthroplasty with long-stem femoral component and with additional acetabular prosthesis. Bone matrix of the femur is demineralized. Multiple cerclage wires are utilized. Lateral plate and screw fixation of the femoral shaft. Left knee arthroplasty.  Visualized components appear grossly well-seated. IMPRESSION: Intraoperative fluoroscopy obtained for surgical control purposes demonstrating left hip and left knee arthroplasties with plate and screw fixation of the left femur. Electronically Signed   By: Lucienne Capers M.D.   On: 11/01/2015 21:37   Dg Femur Port Min 2 Views Left  11/02/2015  CLINICAL DATA:  Postoperative evaluation. EXAM: LEFT FEMUR PORTABLE 2 VIEWS COMPARISON: Pelvic radiograph November 01, 2015 FINDINGS: Status post LEFT hip total arthroplasty. Lateral plate and screw fixation with cerclage wires. [employer] LEFT greater trochanter, stable. Status post total knee arthroplasty. Osteopenia. Surgical drain in place, subcutaneous gas and overlying skin staples. Included view of the RIGHT femur demonstrates old distal fracture. Moderate vascular calcifications. Cachexia. IMPRESSION: Status post LEFT total hip arthroplasty, LEFT femur plate and screw fixation, LEFT total knee arthroplasty. Surgical drain in place. Electronically Signed   By: Elon Alas M.D.   On: 11/02/2015 01:20    Impression/Recommendations Principal Problem:  Failed total hip arthroplasty [facility]): pt is s/p of revision of left total hip arthroplasty with both acetabular and femoral component posterior approach. Now has moderate pain.  -managed by ortho as primary team -Pain control per ortho  HTN: -Continue home amlodipine and Micardis, but will start in AM  GERD: -Protonix  Addison's disease:  -continue home Cortet and fludrocortisone -will start stress dose of Solu Cortef, 50 mg every 8 hour -Check cortisol level  Rheumatoid arthritis ([place]): -continue Plaquenil   HLD: Last LDL was not on record -Continue home medications: Pravastatin -Check FLP  Anxiety: -continue home Xanax  Arrhythmia: Diagnosis is not clear. Patient has been taking digoxin since 1978 per patient. -continue home digoxin -check digoxin level -will check EKG   Thank you for  this consultation.  Our [facility] hospitalist team will follow the patient with you.   Time Spent: 35 min  Ivor Costa M.D. [facility] 11/02/2015, 3:48 AM

## 2015-11-02 ENCOUNTER — Encounter (HOSPITAL_COMMUNITY): Payer: Self-pay | Admitting: Internal Medicine

## 2015-11-02 ENCOUNTER — Inpatient Hospital Stay (HOSPITAL_COMMUNITY): Payer: Medicare Other

## 2015-11-02 DIAGNOSIS — I499 Cardiac arrhythmia, unspecified: Secondary | ICD-10-CM | POA: Diagnosis present

## 2015-11-02 LAB — CBC
HEMATOCRIT: 29.3 % — AB (ref 39.0–52.0)
Hemoglobin: 9.8 g/dL — ABNORMAL LOW (ref 13.0–17.0)
MCH: 28.8 pg (ref 26.0–34.0)
MCHC: 33.4 g/dL (ref 30.0–36.0)
MCV: 86.2 fL (ref 78.0–100.0)
PLATELETS: 203 10*3/uL (ref 150–400)
RBC: 3.4 MIL/uL — AB (ref 4.22–5.81)
RDW: 14.3 % (ref 11.5–15.5)
WBC: 9.8 10*3/uL (ref 4.0–10.5)

## 2015-11-02 LAB — BASIC METABOLIC PANEL
ANION GAP: 11 (ref 5–15)
BUN: 8 mg/dL (ref 6–20)
CO2: 24 mmol/L (ref 22–32)
Calcium: 7.5 mg/dL — ABNORMAL LOW (ref 8.9–10.3)
Chloride: 106 mmol/L (ref 101–111)
Creatinine, Ser: 0.63 mg/dL (ref 0.61–1.24)
GLUCOSE: 182 mg/dL — AB (ref 65–99)
POTASSIUM: 3.4 mmol/L — AB (ref 3.5–5.1)
Sodium: 141 mmol/L (ref 135–145)

## 2015-11-02 LAB — DIGOXIN LEVEL: DIGOXIN LVL: 0.4 ng/mL — AB (ref 0.8–2.0)

## 2015-11-02 LAB — CORTISOL-AM, BLOOD: Cortisol - AM: 83.2 ug/dL — ABNORMAL HIGH (ref 6.7–22.6)

## 2015-11-02 LAB — CREATININE, SERUM: Creatinine, Ser: 0.66 mg/dL (ref 0.61–1.24)

## 2015-11-02 MED ORDER — METHOCARBAMOL 500 MG PO TABS
500.0000 mg | ORAL_TABLET | Freq: Four times a day (QID) | ORAL | Status: DC | PRN
  Administered 2015-11-02 – 2015-11-05 (×2): 500 mg via ORAL
  Filled 2015-11-02 (×3): qty 1

## 2015-11-02 MED ORDER — ALPRAZOLAM 0.25 MG PO TABS
0.2500 mg | ORAL_TABLET | Freq: Every day | ORAL | Status: DC
  Administered 2015-11-02 – 2015-11-04 (×4): 0.25 mg via ORAL
  Filled 2015-11-02 (×4): qty 1

## 2015-11-02 MED ORDER — POLYVINYL ALCOHOL 1.4 % OP SOLN: 1.0000 [drp] | Freq: Every day | OPHTHALMIC | Status: DC | PRN

## 2015-11-02 MED ORDER — VANCOMYCIN HCL IN DEXTROSE 1-5 GM/200ML-% IV SOLN
1000.0000 mg | Freq: Two times a day (BID) | INTRAVENOUS | Status: AC
  Administered 2015-11-02: 1000 mg via INTRAVENOUS
  Filled 2015-11-02: qty 200

## 2015-11-02 MED ORDER — MAGNESIUM CITRATE PO SOLN
1.0000 | Freq: Once | ORAL | Status: AC | PRN
  Administered 2015-11-05: 1 via ORAL
  Filled 2015-11-02: qty 296

## 2015-11-02 MED ORDER — SENNA 8.6 MG PO TABS
2.0000 | ORAL_TABLET | Freq: Every day | ORAL | Status: DC
  Administered 2015-11-02 – 2015-11-04 (×4): 17.2 mg via ORAL
  Filled 2015-11-02 (×4): qty 2

## 2015-11-02 MED ORDER — SODIUM CHLORIDE 0.9 % IV SOLN
INTRAVENOUS | Status: DC
  Administered 2015-11-02: 06:00:00 via INTRAVENOUS
  Administered 2015-11-02 – 2015-11-03 (×2): 125 mL/h via INTRAVENOUS
  Administered 2015-11-05: 04:00:00 via INTRAVENOUS

## 2015-11-02 MED ORDER — DEXAMETHASONE SODIUM PHOSPHATE 10 MG/ML IJ SOLN: 10.0000 mg | Freq: Once | INTRAMUSCULAR | Status: DC

## 2015-11-02 MED ORDER — ACETAMINOPHEN 325 MG PO TABS
650.0000 mg | ORAL_TABLET | Freq: Four times a day (QID) | ORAL | Status: DC | PRN
  Administered 2015-11-03 – 2015-11-04 (×3): 650 mg via ORAL
  Filled 2015-11-02 (×4): qty 2

## 2015-11-02 MED ORDER — SORBITOL 70 % SOLN: 30.0000 mL | Freq: Every day | Status: DC | PRN

## 2015-11-02 MED ORDER — KETOROLAC TROMETHAMINE 15 MG/ML IJ SOLN
7.5000 mg | Freq: Four times a day (QID) | INTRAMUSCULAR | Status: AC
  Administered 2015-11-02 (×3): 7.5 mg via INTRAVENOUS
  Filled 2015-11-02 (×3): qty 1

## 2015-11-02 MED ORDER — HYDROCORTISONE 20 MG PO TABS
20.0000 mg | ORAL_TABLET | Freq: Every day | ORAL | Status: DC
  Administered 2015-11-03 – 2015-11-05 (×3): 20 mg via ORAL
  Filled 2015-11-02 (×3): qty 1

## 2015-11-02 MED ORDER — HYDROXYCHLOROQUINE SULFATE 200 MG PO TABS
200.0000 mg | ORAL_TABLET | Freq: Every day | ORAL | Status: DC
  Administered 2015-11-02 – 2015-11-05 (×4): 200 mg via ORAL
  Filled 2015-11-02 (×4): qty 1

## 2015-11-02 MED ORDER — POLYETHYLENE GLYCOL 3350 17 G PO PACK: 17.0000 g | PACK | Freq: Every day | ORAL | Status: DC | PRN

## 2015-11-02 MED ORDER — FOLIC ACID 1 MG PO TABS
1.0000 mg | ORAL_TABLET | Freq: Every day | ORAL | Status: DC
  Administered 2015-11-02 – 2015-11-05 (×4): 1 mg via ORAL
  Filled 2015-11-02 (×4): qty 1

## 2015-11-02 MED ORDER — HYDROCORTISONE NA SUCCINATE PF 100 MG IJ SOLR
50.0000 mg | Freq: Two times a day (BID) | INTRAMUSCULAR | Status: AC
  Administered 2015-11-02: 50 mg via INTRAVENOUS
  Filled 2015-11-02: qty 1

## 2015-11-02 MED ORDER — PRAVASTATIN SODIUM 20 MG PO TABS
20.0000 mg | ORAL_TABLET | Freq: Every day | ORAL | Status: DC
  Administered 2015-11-02 – 2015-11-05 (×4): 20 mg via ORAL
  Filled 2015-11-02 (×4): qty 1

## 2015-11-02 MED ORDER — APIXABAN 2.5 MG PO TABS
2.5000 mg | ORAL_TABLET | Freq: Two times a day (BID) | ORAL | Status: DC
Start: 2015-11-02 — End: 2015-11-05
  Administered 2015-11-02 – 2015-11-05 (×7): 2.5 mg via ORAL
  Filled 2015-11-02 (×7): qty 1

## 2015-11-02 MED ORDER — METHOCARBAMOL 1000 MG/10ML IJ SOLN: 500.0000 mg | Freq: Four times a day (QID) | INTRAVENOUS | Status: DC | PRN

## 2015-11-02 MED ORDER — HYDROMORPHONE HCL 1 MG/ML IJ SOLN
0.5000 mg | INTRAMUSCULAR | Status: DC | PRN
  Administered 2015-11-02: 1 mg via INTRAVENOUS
  Administered 2015-11-02 (×2): 0.5 mg via INTRAVENOUS
  Filled 2015-11-02 (×3): qty 1

## 2015-11-02 MED ORDER — IRBESARTAN 300 MG PO TABS: 300.0000 mg | ORAL_TABLET | Freq: Every day | ORAL | Status: DC

## 2015-11-02 MED ORDER — ONDANSETRON HCL 4 MG PO TABS
4.0000 mg | ORAL_TABLET | Freq: Four times a day (QID) | ORAL | Status: DC | PRN
  Administered 2015-11-02: 4 mg via ORAL
  Filled 2015-11-02: qty 1

## 2015-11-02 MED ORDER — AMLODIPINE BESYLATE 2.5 MG PO TABS: 2.5000 mg | ORAL_TABLET | Freq: Every day | ORAL | Status: DC

## 2015-11-02 MED ORDER — ENOXAPARIN SODIUM 40 MG/0.4ML ~~LOC~~ SOLN: 40.0000 mg | Freq: Every day | SUBCUTANEOUS | Status: DC

## 2015-11-02 MED ORDER — METOCLOPRAMIDE HCL 5 MG PO TABS: 5.0000 mg | ORAL_TABLET | Freq: Three times a day (TID) | ORAL | Status: DC | PRN

## 2015-11-02 MED ORDER — PANTOPRAZOLE SODIUM 40 MG PO TBEC
40.0000 mg | DELAYED_RELEASE_TABLET | Freq: Every day | ORAL | Status: DC
  Administered 2015-11-02 – 2015-11-05 (×4): 40 mg via ORAL
  Filled 2015-11-02 (×4): qty 1

## 2015-11-02 MED ORDER — FLUDROCORTISONE ACETATE 0.1 MG PO TABS
0.1000 mg | ORAL_TABLET | Freq: Every day | ORAL | Status: DC
  Administered 2015-11-02 – 2015-11-05 (×4): 0.1 mg via ORAL
  Filled 2015-11-02 (×4): qty 1

## 2015-11-02 MED ORDER — POTASSIUM CHLORIDE CRYS ER 20 MEQ PO TBCR
40.0000 meq | EXTENDED_RELEASE_TABLET | Freq: Once | ORAL | Status: AC
  Administered 2015-11-02: 40 meq via ORAL
  Filled 2015-11-02: qty 2

## 2015-11-02 MED ORDER — DOCUSATE SODIUM 100 MG PO CAPS
100.0000 mg | ORAL_CAPSULE | Freq: Two times a day (BID) | ORAL | Status: DC
  Administered 2015-11-02 – 2015-11-05 (×8): 100 mg via ORAL
  Filled 2015-11-02 (×8): qty 1

## 2015-11-02 MED ORDER — DIGOXIN 125 MCG PO TABS
0.1250 mg | ORAL_TABLET | Freq: Every day | ORAL | Status: DC
  Administered 2015-11-02 – 2015-11-05 (×4): 0.125 mg via ORAL
  Filled 2015-11-02 (×4): qty 1

## 2015-11-02 MED ORDER — VITAMIN D 1000 UNITS PO TABS
1000.0000 [IU] | ORAL_TABLET | Freq: Every day | ORAL | Status: DC
  Administered 2015-11-02 – 2015-11-05 (×4): 1000 [IU] via ORAL
  Filled 2015-11-02 (×4): qty 1

## 2015-11-02 MED ORDER — ONDANSETRON HCL 4 MG/2ML IJ SOLN: 4.0000 mg | Freq: Four times a day (QID) | INTRAMUSCULAR | Status: DC | PRN

## 2015-11-02 MED ORDER — CETYLPYRIDINIUM CHLORIDE 0.05 % MT LIQD
7.0000 mL | Freq: Two times a day (BID) | OROMUCOSAL | Status: DC
  Administered 2015-11-02 – 2015-11-05 (×5): 7 mL via OROMUCOSAL

## 2015-11-02 MED ORDER — HYDROCORTISONE 20 MG PO TABS: 20.0000 mg | ORAL_TABLET | Freq: Every day | ORAL | Status: DC

## 2015-11-02 MED ORDER — HYDROCODONE-ACETAMINOPHEN 7.5-325 MG PO TABS
1.0000 | ORAL_TABLET | ORAL | Status: DC | PRN
  Administered 2015-11-02 – 2015-11-03 (×3): 2 via ORAL
  Administered 2015-11-04 – 2015-11-05 (×4): 1 via ORAL
  Administered 2015-11-05: 2 via ORAL
  Filled 2015-11-02 (×4): qty 2
  Filled 2015-11-02 (×4): qty 1

## 2015-11-02 MED ORDER — ACETAMINOPHEN 650 MG RE SUPP: 650.0000 mg | Freq: Four times a day (QID) | RECTAL | Status: DC | PRN

## 2015-11-02 MED ORDER — METOCLOPRAMIDE HCL 5 MG/ML IJ SOLN: 5.0000 mg | Freq: Three times a day (TID) | INTRAMUSCULAR | Status: DC | PRN

## 2015-11-02 MED ORDER — PHENOL 1.4 % MT LIQD: 1.0000 | OROMUCOSAL | Status: DC | PRN

## 2015-11-02 MED ORDER — MENTHOL 3 MG MT LOZG: 1.0000 | LOZENGE | OROMUCOSAL | Status: DC | PRN

## 2015-11-02 NOTE — Anesthesia Postprocedure Evaluation (Signed)
Anesthesia Post Note  Patient: John Lyons  Procedure(s) Performed: Procedure(s) (LRB): REVISION OF LEFT TOTAL HIP ARTHROPLASTY WITH BOTH ACETABULAR AND FEMORAL COMPONENTS POSTERIOR APPROACH (Left)  Patient location during evaluation: PACU Anesthesia Type: General Level of consciousness: sedated Pain management: pain level controlled Vital Signs Assessment: post-procedure vital signs reviewed and stable Respiratory status: spontaneous breathing and respiratory function stable Cardiovascular status: stable Anesthetic complications: no    Last Vitals:  Filed Vitals:   11/01/15 2345 11/01/15 2355  BP: 127/52   Pulse: 73 72  Temp:  36.6 C  Resp: 21 20    Last Pain:  Filed Vitals:   11/01/15 2356  PainSc: 3     LLE Motor Response: Purposeful movement;Responds to commands (11/01/15 2355) LLE Sensation: Full sensation (11/01/15 2355)          Duane Boston DANIEL

## 2015-11-02 NOTE — Op Note (Signed)
NAME:  John Lyons, John Lyons              ACCOUNT NO.:  [id number]  MEDICAL RECORD NO.:  [id number]  LOCATION:  [id number]                        FACILITY:  [facility]  PHYSICIAN:  Rod Can, MD     DATE OF BIRTH:  04-17-1946  DATE OF PROCEDURE:  11/01/2015 DATE OF DISCHARGE:                              OPERATIVE REPORT   SURGEON:  Rod Can, MD.  ASSISTANT:  Ky Barban, RNFA.  PREOPERATIVE DIAGNOSIS:  Failed left total hip arthroplasty.  POSTOPERATIVE DIAGNOSIS:  Failed left total hip arthroplasty.  PROCEDURE PERFORMED: 1. Revision, left total hip arthroplasty, acetabular and femoral     components. 2. Open reduction and internal fixation, left femur.  EXPLANTS:  Zimmer cemented total hip system with 52 mm cup and monoblock 32 mm standard femoral component.  IMPLANTS: 1. Zimmer trabecular metal acetabular revision shell, size 66 mm with     6.5 mm cancellous screws x3. 2. 58 x 15 mm Zimmer acetabular augment with 6.5 mm cancellous screws     x2. 3. Size 36, 10-degree lipped cemented liner. 4. Zimmer Palacos R+G bone cement. 5. Arcos 14 x 190 mm STS stem with size B, 70 mm, standard offset,     cone body. 6. 36 mm -3 metal femoral head. 7. Zimmer NCB 15-hole distal periprosthetic femur locking plate with     5.0 mm screws and 1.8-mm adult recon cables. 8. 20-gauge stainless steel wire.  ANESTHESIA:  General.  ANTIBIOTICS: 1. 2 g Ancef. 2. 1 g vancomycin.  BLOOD:  2 units of packed red blood cells.  EBL:  1400 mL.  DRAINS:  One medium Hemovac drain in the left hip.  SPECIMENS:  Left hip fluid for culture.  DISPOSITION:  Stable to PACU.  INDICATIONS:  The patient is a 70 year old male with rheumatoid arthritis who underwent cemented bilateral total hip arthroplasties about 35 years ago.  He had progressive pain on the left side.  Also, has a history of a previous left total knee replacement with severe stiffness.  He had a recent right total knee arthroplasty in  2013 by Dr. Ronnie Derby.  The patient presented to me with worsening left hip pain. Radiographs revealed previous cemented left total hip arthroplasties, acetabular and femoral components with catastrophic failure and severe bone loss.  The patient was worked up with a CT scan to evaluate for pelvic discontinuity which was negative.  He also had CT angiogram to rule out involvement of his iliac vessels with some extruded cement. Risks, benefits, and alternatives to revision of his left total hip were explained.  He elected to proceed.  DESCRIPTION OF PROCEDURE IN DETAIL:  I identified the patient in the holding area using 2 identifiers.  I marked the surgical site.  He was taken to the operating room.  General anesthesia was induced on his bed. He was flipped to the right lateral decubitus position.  He was held with a hip positioner.  Axillary roll was placed.  All bony prominences were well padded.  The left hip was prepped and draped in normal sterile surgical fashion.  Time-out was called verifying side and site of surgery.  He did receive IV antibiotics within 60 minutes of beginning the procedure.  I began by using a #10 blade to excise his previous skin incision.  I created full-thickness skin flaps.  I identified the IT band which I split in line with fibers.  I split the gluteus maximus proximally.  Charnley retractor was placed.  I took down short external rotators and posterior capsule in a single layer.  The sciatic nerve was palpated and protected throughout the entire procedure. I identified his total hip.  He had a very small amount of joint fluid.  I sent this for culture.  There was no pus or evidence of infection.  I used a bone hook to gently dislocate the femoral component.  The femoral component was grossly loose and I easily removed it by hand.  It was obvious that his cement mantle was fractured.  After I removed all loose pieces of cement, I identified that he had 3  large cavitary defects in the femur. One defect was located on the anterior aspect of the femoral neck between the calcar and the greater trochanter.  This was an uncontained defect.  He also had a large defect a couple centimeters distal to the vastus tubercle and he had a third defect on the posterior aspect of the femur just distal to the previously described defect.  My original plan was going to be to use his previous cement mantle and place a new cemented component, but with severe proximal femur bone loss, this was not going to be a viable option.  He did have a Paprosky 2C proximal femur defect with unsupportive metaphysis.  I used drill and  back scratcher osteotomes to remove his cement pedestal.  This was done under live fluoro.  This was done with some great difficulty due to proximal femoral remodelling.  Once the cement was removed, I did place a prophylactic cerclage wire.  I then began reaming for an Arcos.  I began with a 12 mm reamer with passing even the first reamer.  Due to his multiple cavitary defects, he did sustain a proximal femoral fracture basically located in between the distal 2 defects which were located right around the area of his previous cement pedestal.  I reduced the fracture.  I placed multiple sets of cerclage wires to hold the reduction.  I then sequentially reamed him up to a 14 without significant gapping of the fracture.  I placed 190 mm x 14 mm tapered stem.  I then turned my attention to the acetabulum.  The acetabular component was grossly loose.  I removed the socket.  He had large cavitary defect at essentially 10 and 2:00 p.m. This is a Paprosky 3A acetabular defect.  I debrided these with a curette to a stable bony base.   I removed all the cement inferiorly as well.  There was no discontinuity.   His medial wall was intact.  He did have good anterior and posterior columns.   He had a large superior cavitary defect.  I reamed sequentially  keeping the hip center low and lateral up to 65 mm reamer.  I then took a 66 mm trial shell.  I gently held it in place and I selected the proximal augment that would fit the defect.  I selected a 58 x 15 mm defect.  I did not have to trim any bone.  This fit very nicely.  His 2 cavitary defects were too small for augments.  I chose to pack these with 40 mL of cancellous bone chips.  I then opened the  real superior augment.  I placed it and then I was able to secure it with 2 very good screws, 1 centrally and 1 anteriorly.  The posterior screw was an air ball.  Once the augment was fixed, we then opened the real cup.  We mixed 1 pack of cement.  This was placed on the part of the augment that would contact the cup.  The cup was impacted into place.  It got excellent scratch fit between both the anterior and posterior columns as well as the augment superiorly.  The purchase of the cup was excellent. I chose to place 2  superior screws into the ilium through the cup.  I then selected a 10-degree  Lipped liner.  This was cemented into place with a pack of Palacos cement.   I held the cup in place.  The excess cement was cleared.  This was held stably until the cement was hardened.  I then returned my attention to the femur.  I reamed proximally for a size B body.  A trial body was placed with a -3 head.  The hip was reduced.  The hip was very stable. Soft tissue tension was appropriate.  I then dislocated the hip.  I removed the trial body.  I placed the real body maintaining correct version.  I then placed a real -336 mm head ball.  The hip was reduced. Stability testing was difficult to perform because of ankylosis of the left knee but through the range, I was able to obtain the hip was very stable.  Due to his poor bone quality, I elected to protect his entire femur.  I chose a distal periprosthetic NCB plate.  He did have a total knee again distally.  I selected the 15-hole plate.  This  was held in place with K-wires.  I checked the position with fluoroscopy.  I then compressed the plate distally with 5.0 mm screws x3 as well as 2 additional screws in the metadiaphyseal region distal to the tip of the stem.  I placed 3 cables proximally through beads that were threaded into the screw holes.  The cables were tightened and then the screws were tightened and then the cables were clipped.  I obtained final AP and lateral fluoroscopy views of the pelvis and left femur.  The hardware position was excellent.  The fracture reduction was anatomic. All hardware positioning was appropriate.  The wound was then copiously irrigated with saline.  The short external rotators were closed with #1 Vicryl suture as well as the posterior capsule.  The IT band was closed with #1 Vicryl over a medium Hemovac drain.  The deep dermal layer was closed with 2-0 interrupted Monocryl.  Skin was reapproximated with staples.  Dermabond was applied to the skin.  Once the glue was hardened, Aquacel dressing was applied.  The patient was then extubated, taken to PACU in stable condition.  Sponge, needle, and instrument counts were correct at the end of the case x2.  He tolerated the procedure well.  I discussed the operative events and findings with the patient's family. We will admit him to the hospital.  He will be touchdown weightbearing to left lower extremity.  We will start him on Lovenox for DVT prophylaxis.  I will consult the hospitalist for medical management as well as dosing of his stress steroids due to Addison disease.  We will work on disposition planning as well as physical and occupational therapy.  All questions were solicited and  answered to their satisfaction.  I will see him in the office in 2 weeks after discharge.          ______________________________ Rod Can, MD     BS/MEDQ  D:  11/01/2015  T:  11/02/2015  Job:  [id number]

## 2015-11-02 NOTE — Evaluation (Signed)
Physical Therapy Evaluation Patient Details Name: John Lyons MRN: [id number] DOB: 1945/06/16 Today's Date: 11/02/2015   History of Present Illness  Patient is a 70 y/o male with hx of Addison's disease, HTN, HLD, anxiety and RA presents s/p left THA revision, acetabular and femoral components and ORIF left femur.  Clinical Impression  Patient presents with pain and post surgical deficits LLE s/p left THA. Tolerated taking a few hops to get to chair requiring Mod A for support. Able to maintain TDWB during mobility. Education on posterior hip precautions and exercises. Will bring handouts next session. Pt was Mod I with SPC PTA and working. Limited due to lack of knee flexion in LLE from prior knee surgery. Would benefit from short term SNF to maximize independence and mobility prior to return home. Will follow acutely.    Follow Up Recommendations SNF    Equipment Recommendations  None recommended by PT    Recommendations for Other Services OT consult     Precautions / Restrictions Precautions Precautions: Posterior Hip Precaution Comments: Reviewed precautions. Restrictions Weight Bearing Restrictions: Yes Other Position/Activity Restrictions: TDWB LLE      Mobility  Bed Mobility Overal bed mobility: Needs Assistance Bed Mobility: Supine to Sit     Supine to sit: Mod assist;HOB elevated     General bed mobility comments: Assist to bring LLE to EOB and to elevate trunk. Use of rail for support. Increased time and cues.   Transfers Overall transfer level: Needs assistance Equipment used: Rolling walker (2 wheeled) Transfers: Sit to/from [facility] Sit to Stand: Mod assist Stand pivot transfers: Mod assist       General transfer comment: Assist to boost from EOB with cues for hand placement/technique. Maintaining TDWB in standing with much assist. Able to take a few hops to get to chair with Mod A for balance.  Ambulation/Gait                 Stairs            Wheelchair Mobility    Modified Rankin (Stroke Patients Only)       Balance Overall balance assessment: Needs assistance Sitting-balance support: Feet supported;Bilateral upper extremity supported Sitting balance-Leahy Scale: Fair     Standing balance support: During functional activity Standing balance-Leahy Scale: Poor Standing balance comment: Reliant on BUEs and Min-Mod A for standing balance. Able to maintain TDWB LLE in standing with support.                             Pertinent Vitals/Pain Pain Assessment: 0-10 Pain Score: 6  Pain Location: left hip with movement Pain Descriptors / Indicators: Sore Pain Intervention(s): Monitored during session;Repositioned;Limited activity within patient's tolerance    Home Living Family/patient expects to be discharged to:: Private residence Living Arrangements: Spouse/significant other Available Help at Discharge: Family Type of Home: House Home Access: Stairs to enter Entrance Stairs-Rails: None Entrance Stairs-Number of Steps: 1 Home Layout: One level Home Equipment: Environmental consultant - 2 wheels;Bedside commode;Cane - single point      Prior Function Level of Independence: Needs assistance   Gait / Transfers Assistance Needed: Pt using SPC PTA. 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        Hand Dominance   Dominant Hand: Right    Extremity/Trunk Assessment   Upper Extremity Assessment: Defer to OT evaluation  Lower Extremity Assessment: LLE deficits/detail;Generalized weakness   LLE Deficits / Details: Pt with no knee flexion (from priro knee surgery in 1983); Difficulty with QS, ankle AROM WFL.     Communication   Communication: No difficulties  Cognition Arousal/Alertness: Awake/alert Behavior During Therapy: WFL for tasks assessed/performed Overall Cognitive Status: Within  Functional Limits for tasks assessed                      General Comments      Exercises Total Joint Exercises Ankle Circles/Pumps: Both;10 reps;Supine Quad Sets: Both;10 reps;Supine Gluteal Sets: Both;10 reps;Supine      Assessment/Plan    PT Assessment Patient needs continued PT services  PT Diagnosis Difficulty walking;Generalized weakness   PT Problem List Decreased strength;Decreased range of motion;Decreased activity tolerance;Decreased balance;Decreased mobility  PT Treatment Interventions Balance training;Gait training;Functional mobility training;Therapeutic activities;Therapeutic exercise;Wheelchair mobility training;Patient/family education;DME instruction   PT Goals (Current goals can be found in the Care Plan section) Acute Rehab PT Goals Patient Stated Goal: to get back to work and home ASAP PT Goal Formulation: With patient Time For Goal Achievement: 11/16/15 Potential to Achieve Goals: Fair    Frequency 7X/week   Barriers to discharge Decreased caregiver support;Inaccessible home environment      Co-evaluation               End of Session Equipment Utilized During Treatment: Gait belt Activity Tolerance: Patient tolerated treatment well Patient left: in chair;with call bell/phone within reach Nurse Communication: Mobility status         Time: [id number] PT Time Calculation (min) (ACUTE ONLY): 23 min   Charges:   PT Evaluation $PT Eval Moderate Complexity: 1 Procedure PT Treatments $Therapeutic Activity: 8-22 mins   PT G Codes:        Nicole Hafley A Taichi Repka 11/02/2015, 11:22 AM Wray Kearns, PT, DPT [phone]

## 2015-11-02 NOTE — Progress Notes (Signed)
CONSULT PROGRESS NOTE                                                                                                                                                                                                             Patient Demographics:    John Lyons, is a 70 y.o. male, DOB - June 29, 1945, [id number]  Admit date - 11/01/2015   Admitting Physician Ivor Costa, MD  Outpatient Primary MD for the patient is John Ly, MD  LOS - 1    No chief complaint on file.      Brief Narrative  70 y.o. male hypertension, hyperlipidemia, GERD, anxiety, Addison's disease, rheumatoid arthritis, who is admitted by ortho, Dr. Lillia Corporal for revision of left total hip arthroplasty surgery, Status post surgical repair 7/10, patient with an episode of hypotension during surgery, he had consulted for medical management.  Subjective:    John Lyons today has, No headache, No chest pain, No abdominal pain - No Nausea, No new weakness tingling or numbness, No Cough - SOB.   Assessment  & Plan :    Principal Problem:   Failed total hip arthroplasty ([place]) Active Problems:   Essential hypertension   Addison's disease ([place])   Rheumatoid arthritis ([place])   Anxiety   GERD (gastroesophageal reflux disease)   Hyperlipidemia   Hypertension   Dysrhythmia   Failed total hip arthroplasty ([place]) - pt is s/p of revision of left total hip arthroplasty with both acetabular and femoral component posterior approach. - managed by ortho as primary team - Pain control per ortho  HTN: - Blood pressure has been acceptable to lower side today, will continue to hold amlodipine and Micardis today, but  likely would resume tomorrow if blood pressure started to increase .   GERD: - Protonix  Addison's disease:  -continue home Cortet and fludrocortisone -Started on stress dose steroids, will stop by end of the day  Rheumatoid arthritis ([place]): - continue  Plaquenil   HLD -Continue home medications: Pravastatin  Anxiety: -continue home Xanax  Arrhythmia - Diagnosis is not clear. Patient has been taking digoxin since 1978 per patient. - continue home digoxin    Code Status : fULL  Family Communication  : Brother at bedside  Disposition Plan  : SNF for PT  DVT Prophylaxis  :  Per ortho, On Eliquis  Lab Results  Component  Value Date   PLT 203 11/02/2015    Antibiotics  :   Anti-infectives    Start     Dose/Rate Route Frequency Ordered Stop   11/02/15 1000  hydroxychloroquine (PLAQUENIL) tablet 200 mg     200 mg Oral Daily 11/02/15 0024     11/02/15 0024  vancomycin (VANCOCIN) IVPB 1000 mg/200 mL premix     1,000 mg 200 mL/hr over 60 Minutes Intravenous Every 12 hours 11/02/15 0024 11/02/15 0241   11/01/15 1200  ceFAZolin (ANCEF) IVPB 2g/100 mL premix     2 g 200 mL/hr over 30 Minutes Intravenous To [facility] 10/29/15 0808 11/01/15 2130   11/01/15 1200  vancomycin (VANCOCIN) IVPB 1000 mg/200 mL premix     1,000 mg 200 mL/hr over 60 Minutes Intravenous To [facility]Stay [facility] 10/29/15 0808 11/01/15 1137        Objective:   Filed Vitals:   11/02/15 0055 11/02/15 0120 11/02/15 0232 11/02/15 0554  BP: 122/55 118/51 111/52 118/48  Pulse: 70 65 75 67  Temp: 97.7 F (36.5 C) 97.7 F (36.5 C) 98.1 F (36.7 C) 98.6 F (37 C)  TempSrc: Oral Oral Oral Oral  Resp: 20 17 16 15   Height:      Weight:      SpO2: 100% 100% 100% 100%    Wt Readings from Last 3 Encounters:  11/01/15 58.968 kg (130 lb)  10/22/15 59.058 kg (130 lb 3.2 oz)  08/10/15 58.968 kg (130 lb)     Intake/Output Summary (Last 24 hours) at 11/02/15 1256 Last data filed at 11/02/15 0900  Gross per 24 hour  Intake 6311.25 ml  Output   3350 ml  Net 2961.25 ml     Physical Exam  Awake Alert, Oriented X 3,  [place].AT,PERRAL Supple Neck,No JVD, No cervical lymphadenopathy appriciated.  Symmetrical Chest wall movement, Good air movement  bilaterally, CTAB RRR,No Gallops,Rubs or new Murmurs, No Parasternal Heave +ve B.Sounds, Abd Soft, No tenderness, No organomegaly appriciated, No rebound - guarding or rigidity. No Cyanosis, Clubbing or edema, No new Rash or bruise      Data Review:    CBC  Recent Labs Lab 11/01/15 1316 11/02/15 0213  WBC 9.9 9.8  HGB 9.8* 9.8*  HCT 29.4* 29.3*  PLT 208 203  MCV 86.5 86.2  MCH 28.8 28.8  MCHC 33.3 33.4  RDW 14.3 14.3    Chemistries   Recent Labs Lab 11/02/15 0213  NA 141  K 3.4*  CL 106  CO2 24  GLUCOSE 182*  BUN 8  CREATININE 0.63  0.66  CALCIUM 7.5*   ------------------------------------------------------------------------------------------------------------------ No results for input(s): CHOL, HDL, LDLCALC, TRIG, CHOLHDL, LDLDIRECT in the last 72 hours.  No results found for: HGBA1C ------------------------------------------------------------------------------------------------------------------ No results for input(s): TSH, T4TOTAL, T3FREE, THYROIDAB in the last 72 hours.  Invalid input(s): FREET3 ------------------------------------------------------------------------------------------------------------------ No results for input(s): VITAMINB12, FOLATE, FERRITIN, TIBC, IRON, RETICCTPCT in the last 72 hours.  Coagulation profile No results for input(s): INR, PROTIME in the last 168 hours.  No results for input(s): DDIMER in the last 72 hours.  Cardiac Enzymes No results for input(s): CKMB, TROPONINI, MYOGLOBIN in the last 168 hours.  Invalid input(s): CK ------------------------------------------------------------------------------------------------------------------ No results found for: BNP  Inpatient Medications  Scheduled Meds: . ALPRAZolam  0.25 mg Oral QHS  . antiseptic oral rinse  7 mL Mouth Rinse BID  . apixaban  2.5 mg Oral BID  . cholecalciferol  1,000 Units Oral Daily  . digoxin  0.125 mg Oral Daily  .  docusate sodium  100 mg Oral  BID  . fludrocortisone  0.1 mg Oral Daily  . folic acid  1 mg Oral Daily  . [START ON 11/03/2015] hydrocortisone  20 mg Oral Daily  . hydrocortisone sod succinate (SOLU-CORTEF) inj  50 mg Intravenous Q12H  . hydroxychloroquine  200 mg Oral Daily  . ketorolac  7.5 mg Intravenous Q6H  . pantoprazole  40 mg Oral Daily  . potassium chloride  40 mEq Oral Once  . pravastatin  20 mg Oral Daily  . senna  2 tablet Oral QHS   Continuous Infusions: . sodium chloride 125 mL/hr at 11/02/15 0600   PRN Meds:.acetaminophen **OR** acetaminophen, HYDROcodone-acetaminophen, HYDROmorphone (DILAUDID) injection, magnesium citrate, menthol-cetylpyridinium **OR** phenol, methocarbamol **OR** methocarbamol (ROBAXIN)  IV, metoCLOPramide **OR** metoCLOPramide (REGLAN) injection, ondansetron **OR** ondansetron (ZOFRAN) IV, polyethylene glycol, polyvinyl alcohol, sorbitol  Micro Results Recent Results (from the past 240 hour(s))  Aerobic/Anaerobic Culture (surgical/deep wound)     Status: None (Preliminary result)   Collection Time: 11/01/15  3:16 PM  Result Value Ref Range Status   Specimen Description SYNOVIAL LEFT HIP  Final   Special Requests FLUID ON SWAB PATIENT ON FOLLOWING ANCEF  Final   Gram Stain   Final    RARE WBC PRESENT, PREDOMINANTLY PMN NO ORGANISMS SEEN    Culture NO GROWTH < 24 HOURS  Final   Report Status PENDING  Incomplete    Radiology Reports Dg Chest 2 View  11/01/2015  CLINICAL DATA:  Preoperative total hip replacement. Cough and congestion EXAM: CHEST  2 VIEW COMPARISON:  August 11, 2015 FINDINGS: There is no edema or consolidation. The heart size and pulmonary vascularity are normal. No adenopathy. There is advanced arthropathy in both shoulders. IMPRESSION: No edema or consolidation. Electronically Signed   By: Lowella Grip III M.D.   On: 11/01/2015 10:59   Dg Pelvis Portable  11/02/2015  CLINICAL DATA:  Postoperative left hip revision. EXAM: PORTABLE PELVIS 1-2 VIEWS  COMPARISON:  Intraoperative fluoroscopy 11/01/2015 FINDINGS: Postoperative left total hip arthroplasty using long-stem femoral component with additional acetabular prosthesis superiorly. Plate and screw fixation of the visualize left femoral shaft with cerclage wires present. Distal portion of the surgical hardware in the left femur is not included within the field of view. Visualize components appear well seated. Previous right total hip arthroplasty using cemented components. There is lucency at the bone-cement interface in the acetabular region which may suggest loosening. No dislocation. Visualized pelvis appears intact. IMPRESSION: Postoperative changes in both hips as described. Electronically Signed   By: Lucienne Capers M.D.   On: 11/02/2015 00:24   Dg C-arm 61-120 Min  11/01/2015  CLINICAL DATA:  Left total hip arthroplasty with internal fixation left femoral fracture. EXAM: DG C-ARM 61-120 MIN; LEFT FEMUR 2 VIEWS COMPARISON:  Left hip 05/18/2015 FINDINGS: Intraoperative fluoroscopy is obtained for surgical control purposes during orthopedic procedure. Fluoroscopy time is recorded at 2 minutes 29 seconds. Seven spot fluoroscopic images are obtained. Spot fluoroscopic images obtained of the left hip and left femur the demonstrate placement of a left total hip arthroplasty with long-stem femoral component and with additional acetabular prosthesis. Bone matrix of the femur is demineralized. Multiple cerclage wires are utilized. Lateral plate and screw fixation of the femoral shaft. Left knee arthroplasty. Visualized components appear grossly well-seated. IMPRESSION: Intraoperative fluoroscopy obtained for surgical control purposes demonstrating left hip and left knee arthroplasties with plate and screw fixation of the left femur. Electronically Signed   By: Oren Beckmann.D.  On: 11/01/2015 21:37   Dg Femur Min 2 Views Left  11/01/2015  CLINICAL DATA:  Left total hip arthroplasty with internal  fixation left femoral fracture. EXAM: DG C-ARM 61-120 MIN; LEFT FEMUR 2 VIEWS COMPARISON:  Left hip 05/18/2015 FINDINGS: Intraoperative fluoroscopy is obtained for surgical control purposes during orthopedic procedure. Fluoroscopy time is recorded at 2 minutes 29 seconds. Seven spot fluoroscopic images are obtained. Spot fluoroscopic images obtained of the left hip and left femur the demonstrate placement of a left total hip arthroplasty with long-stem femoral component and with additional acetabular prosthesis. Bone matrix of the femur is demineralized. Multiple cerclage wires are utilized. Lateral plate and screw fixation of the femoral shaft. Left knee arthroplasty. Visualized components appear grossly well-seated. IMPRESSION: Intraoperative fluoroscopy obtained for surgical control purposes demonstrating left hip and left knee arthroplasties with plate and screw fixation of the left femur. Electronically Signed   By: Lucienne Capers M.D.   On: 11/01/2015 21:37   Dg Femur Port Min 2 Views Left  11/02/2015  CLINICAL DATA:  Postoperative evaluation. EXAM: LEFT FEMUR PORTABLE 2 VIEWS COMPARISON: Pelvic radiograph November 01, 2015 FINDINGS: Status post LEFT hip total arthroplasty. Lateral plate and screw fixation with cerclage wires. Lucent LEFT greater trochanter, stable. Status post total knee arthroplasty. Osteopenia. Surgical drain in place, subcutaneous gas and overlying skin staples. Included view of the RIGHT femur demonstrates old distal fracture. Moderate vascular calcifications. Cachexia. IMPRESSION: Status post LEFT total hip arthroplasty, LEFT femur plate and screw fixation, LEFT total knee arthroplasty. Surgical drain in place. Electronically Signed   By: Elon Alas M.D.   On: 11/02/2015 01:20    Time Spent in minutes  25 minutes   Analilia Geddis M.D on 11/02/2015 at 12:56 PM  Between 7am to 7pm - Pager - [phone]  After 7pm go to [email or web address] - password [facility]  [facility] -  Office  [phone]

## 2015-11-02 NOTE — Progress Notes (Signed)
   Subjective:  Patient reports pain as mild to moderate.  No c/o. Denies N/V/CP/SOB.  Objective:   VITALS:   Filed Vitals:   11/02/15 0055 11/02/15 0120 11/02/15 0232 11/02/15 0554  BP: 122/55 118/51 111/52 118/48  Pulse: 70 65 75 67  Temp: 97.7 F (36.5 C) 97.7 F (36.5 C) 98.1 F (36.7 C) 98.6 F (37 C)  TempSrc: Oral Oral Oral Oral  Resp: 20 17 16 15   Height:      Weight:      SpO2: 100% 100% 100% 100%    ABD soft Sensation intact distally Intact pulses distally Dorsiflexion/Plantar flexion intact Incision: dressing C/D/I Compartment soft HV ss  Lab Results  Component Value Date   WBC 9.8 11/02/2015   HGB 9.8* 11/02/2015   HCT 29.3* 11/02/2015   MCV 86.2 11/02/2015   PLT 203 11/02/2015   BMET    Component Value Date/Time   NA 141 11/02/2015 0213   K 3.4* 11/02/2015 0213   CL 106 11/02/2015 0213   CO2 24 11/02/2015 0213   GLUCOSE 182* 11/02/2015 0213   BUN 8 11/02/2015 0213   CREATININE 0.63 11/02/2015 0213   CREATININE 0.66 11/02/2015 0213   CALCIUM 7.5* 11/02/2015 0213   GFRNONAA >60 11/02/2015 0213   GFRNONAA >60 11/02/2015 0213   GFRAA >60 11/02/2015 0213   GFRAA >60 11/02/2015 0213     Assessment/Plan: 1 Day Post-Op   Principal Problem:   Failed total hip arthroplasty ([facility]) Active Problems:   Essential hypertension   Addison's disease ([facility])   Rheumatoid arthritis ([facility])   Anxiety   GERD (gastroesophageal reflux disease)   Hyperlipidemia   Hypertension   Dysrhythmia   TDWB LLE Posterior hip precautions DVT ppx: apixaban, SCDs, TEDs PT/OT Appreciate hospitalist recs Cont HV drain today Dispo: likely SNF, Wed vs Thursday   Elie Goody 11/02/2015, 7:39 AM   Rod Can, MD Cell [phone]

## 2015-11-02 NOTE — NC FL2 (Signed)
[place] LEVEL OF CARE SCREENING TOOL     IDENTIFICATION  Patient Name: John Lyons Birthdate: [birth year] Sex: male Admission Date (Current Location): 11/01/2015  [facility] and [place] Number:  Herbalist and Address:  [facility]. [facility], [place] [address] [place], [place] [address]      Provider Number: [id number]  Attending Physician Name and Address:  Rod Can, MD  Relative Name and Phone Number:  Bethena Roys, spouse, [phone]    Current Level of Care: Hospital Recommended Level of Care: [place] Prior Approval Number:    Date Approved/Denied:   PASRR Number: [id number]  Discharge Plan: SNF    Current Diagnoses: Patient Active Problem List   Diagnosis Date Noted  . Dysrhythmia   . Failed total hip arthroplasty ([place]) 11/01/2015  . Anxiety   . GERD (gastroesophageal reflux disease)   . Hyperlipidemia   . Hypertension   . Acute calculous cholecystitis   . Hypokalemia   . Abdominal pain 08/11/2015  . Addison's disease ([place]) 08/11/2015  . Spinal stenosis 08/11/2015  . Rheumatoid arthritis ([place]) 08/11/2015  . Diverticulosis 08/11/2015  . Fever 08/11/2015  . Pyrexia   . Midline low back pain without sciatica   . Essential hypertension 07/13/2015    Orientation RESPIRATION BLADDER Height & Weight     Self, Time, Situation, Place  O2 (Nasal cannula 2L) Continent, Indwelling catheter (Urinary catheter) Weight: 58.968 kg (130 lb) Height:  5\' 3"  (160 cm)  BEHAVIORAL SYMPTOMS/MOOD NEUROLOGICAL BOWEL NUTRITION STATUS      Continent Diet (Please see DC Summary)  AMBULATORY STATUS COMMUNICATION OF NEEDS Skin   Extensive Assist Verbally Normal                       Personal Care Assistance Level of Assistance  Bathing, Feeding, Dressing Bathing Assistance: Maximum assistance Feeding assistance: Independent Dressing Assistance: Limited assistance     Functional Limitations Info  Sight Sight Info:  Impaired        SPECIAL CARE FACTORS FREQUENCY  PT (By licensed PT)     PT Frequency: 5x/week              Contractures      Additional Factors Info  Code Status, Allergies, Psychotropic Code Status Info: Full Allergies Info: Adhesive, Phenobarbital Psychotropic Info: Xanax         Current Medications (11/02/2015):  This is the current hospital active medication list Current Facility-Administered Medications  Medication Dose Route Frequency Provider Last Rate Last Dose  . 0.9 %  sodium chloride infusion   Intravenous Continuous Ivor Costa, MD 125 mL/hr at 11/02/15 0600    . acetaminophen (TYLENOL) tablet 650 mg  650 mg Oral Q6H PRN Rod Can, MD       Or  . acetaminophen (TYLENOL) suppository 650 mg  650 mg Rectal Q6H PRN Rod Can, MD      . ALPRAZolam Duanne Moron) tablet 0.25 mg  0.25 mg Oral QHS Rod Can, MD   0.25 mg at 11/02/15 0103  . antiseptic oral rinse (CPC / CETYLPYRIDINIUM CHLORIDE 0.05%) solution 7 mL  7 mL Mouth Rinse BID Rod Can, MD   7 mL at 11/02/15 1000  . apixaban (ELIQUIS) tablet 2.5 mg  2.5 mg Oral BID Rod Can, MD   2.5 mg at 11/02/15 1028  . cholecalciferol (VITAMIN D) tablet 1,000 Units  1,000 Units Oral Daily Rod Can, MD   1,000 Units at 11/02/15 1028  . digoxin (  LANOXIN) tablet 0.125 mg  0.125 mg Oral Daily Rod Can, MD   0.125 mg at 11/02/15 1028  . docusate sodium (COLACE) capsule 100 mg  100 mg Oral BID Rod Can, MD   100 mg at 11/02/15 1028  . fludrocortisone (FLORINEF) tablet 0.1 mg  0.1 mg Oral Daily Rod Can, MD   0.1 mg at 11/02/15 1028  . folic acid (FOLVITE) tablet 1 mg  1 mg Oral Daily Rod Can, MD   1 mg at 11/02/15 1028  . HYDROcodone-acetaminophen (NORCO) 7.5-325 MG per tablet 1-2 tablet  1-2 tablet Oral Q4H PRN Rod Can, MD   2 tablet at 11/02/15 1027  . [START ON 11/03/2015] hydrocortisone (CORTEF) tablet 20 mg  20 mg Oral Daily Ivor Costa, MD      . hydrocortisone sodium  succinate (SOLU-CORTEF) 100 MG injection 50 mg  50 mg Intravenous Q12H Dawood S Elgergawy, MD      . HYDROmorphone (DILAUDID) injection 0.5-1 mg  0.5-1 mg Intravenous Q2H PRN Rod Can, MD   1 mg at 11/02/15 1509  . hydroxychloroquine (PLAQUENIL) tablet 200 mg  200 mg Oral Daily Rod Can, MD   200 mg at 11/02/15 1028  . ketorolac (TORADOL) 15 MG/ML injection 7.5 mg  7.5 mg Intravenous Q6H Rod Can, MD   7.5 mg at 11/02/15 0601  . magnesium citrate solution 1 Bottle  1 Bottle Oral Once PRN Rod Can, MD      . menthol-cetylpyridinium (CEPACOL) lozenge 3 mg  1 lozenge Oral PRN Rod Can, MD       Or  . phenol (CHLORASEPTIC) mouth spray 1 spray  1 spray Mouth/Throat PRN Rod Can, MD      . methocarbamol (ROBAXIN) tablet 500 mg  500 mg Oral Q6H PRN Rod Can, MD       Or  . methocarbamol (ROBAXIN) 500 mg in dextrose 5 % 50 mL IVPB  500 mg Intravenous Q6H PRN Rod Can, MD      . metoCLOPramide (REGLAN) tablet 5-10 mg  5-10 mg Oral Q8H PRN Rod Can, MD       Or  . metoCLOPramide (REGLAN) injection 5-10 mg  5-10 mg Intravenous Q8H PRN Rod Can, MD      . ondansetron [facility]) tablet 4 mg  4 mg Oral Q6H PRN Rod Can, MD   4 mg at 11/02/15 1028   Or  . ondansetron (ZOFRAN) injection 4 mg  4 mg Intravenous Q6H PRN Rod Can, MD      . pantoprazole (PROTONIX) EC tablet 40 mg  40 mg Oral Daily Rod Can, MD   40 mg at 11/02/15 1028  . polyethylene glycol (MIRALAX / GLYCOLAX) packet 17 g  17 g Oral Daily PRN Rod Can, MD      . polyvinyl alcohol (LIQUIFILM TEARS) 1.4 % ophthalmic solution 1 drop  1 drop Both Eyes Daily PRN Rod Can, MD      . pravastatin (PRAVACHOL) tablet 20 mg  20 mg Oral Daily Rod Can, MD   20 mg at 11/02/15 1028  . senna (SENOKOT) tablet 17.2 mg  2 tablet Oral QHS Rod Can, MD   17.2 mg at 11/02/15 0136  . sorbitol 70 % solution 30 mL  30 mL Oral Daily PRN Rod Can, MD          Discharge Medications: Please see discharge summary for a list of discharge medications.  Relevant Imaging Results:  Relevant Lab Results:   Additional Information SSN: [id number]  Benard Halsted, LCSWA

## 2015-11-02 NOTE — Progress Notes (Signed)
Physical Therapy Treatment Patient Details Name: John Lyons MRN: [id number] DOB: 21-Jun-1945 Today's Date: 11/02/2015    History of Present Illness Patient is a 70 y/o male with hx of Addison's disease, HTN, HLD, anxiety and RA presents s/p left THA revision, acetabular and femoral components and ORIF left femur.    PT Comments    Patient currently mod A for OOB transfers and gait training. Pt able to ambulate ~6 ft and 69ft this session. Reviewed HEP and precautions and pt given handouts. Continue to progress as tolerated with anticipated d/c to SNF for further skilled PT services.    Follow Up Recommendations  SNF     Equipment Recommendations  None recommended by PT    Recommendations for Other Services OT consult     Precautions / Restrictions Precautions Precautions: Posterior Hip Precaution Comments: reviewed 3/3 precautions and given handout Restrictions Weight Bearing Restrictions: Yes Other Position/Activity Restrictions: TDWB LLE    Mobility  Bed Mobility Overal bed mobility: Needs Assistance Bed Mobility: Supine to Sit;Sit to Supine     Supine to sit: Mod assist;HOB elevated Sit to supine: Mod assist   General bed mobility comments: assist to bring L LE to EOB, scoot hips to EOB with use of bed pad, and elevate trunk into sitting; use of rail, HOB elevated, and increased time; cues for technique and sequencing; assist to elevate bilat LE into bed  Transfers Overall transfer level: Needs assistance Equipment used: Rolling walker (2 wheeled) Transfers: Sit to/from Stand Sit to Stand: Mod assist Stand pivot transfers: Mod assist       General transfer comment: X1 from EOB and X1 from recliner; cues for hand placement, technique, and maintaining TDWB status; mod A to power up into standing as pt demo'd limited ROM in bilat knees; therapist blocking R foot   Ambulation/Gait Ambulation/Gait assistance: Min assist Ambulation Distance (Feet): 8 Feet (6,  2) Assistive device: Rolling walker (2 wheeled) Gait Pattern/deviations: Step-to pattern     General Gait Details: cues for sequencing of gait with use of AD, technique, and WB status   Stairs            Wheelchair Mobility    Modified Rankin (Stroke Patients Only)       Balance Overall balance assessment: Needs assistance Sitting-balance support: Feet supported;Bilateral upper extremity supported Sitting balance-Leahy Scale: Fair     Standing balance support: During functional activity Standing balance-Leahy Scale: Poor Standing balance comment: Reliant on BUEs and Min-Mod A for standing balance. Able to maintain TDWB LLE in standing with support.                    Cognition Arousal/Alertness: Awake/alert Behavior During Therapy: WFL for tasks assessed/performed Overall Cognitive Status: Within Functional Limits for tasks assessed                      Exercises Total Joint Exercises Ankle Circles/Pumps: Both;10 reps;Supine Quad Sets: Both;10 reps;Supine Gluteal Sets: Both;10 reps;Supine    General Comments General comments (skin integrity, edema, etc.): reviewed HEP hand out; family present for session; pt reported that when transferring to bed with nursing staff prior to session there was an audible pop when sitting down; pt reported no pain at time of pop or post transfer      Pertinent Vitals/Pain Pain Assessment: Faces Pain Score: 6  Faces Pain Scale: Hurts little more Pain Location: L hip Pain Descriptors / Indicators: Sore Pain Intervention(s): Limited activity within patient's tolerance;Monitored  during session;Premedicated before session;Repositioned    Home Living Family/patient expects to be discharged to:: Private residence Living Arrangements: Spouse/significant other Available Help at Discharge: Family Type of Home: House Home Access: Stairs to enter Entrance Stairs-Rails: None Home Layout: One level Home Equipment: Environmental consultant  - 2 wheels;Bedside commode;Cane - single point      Prior Function Level of Independence: Needs assistance  Gait / Transfers Assistance Needed: Pt using SPC PTA. Works at [employer], mostly desk job work [employer] Needed: Wife assists with shoes/socks.     PT Goals (current goals can now be found in the care plan section) Acute Rehab PT Goals Patient Stated Goal: to get back to work and home ASAP PT Goal Formulation: With patient Time For Goal Achievement: 11/16/15 Potential to Achieve Goals: Fair Progress towards PT goals: Progressing toward goals    Frequency  7X/week    PT Plan Current plan remains appropriate    Co-evaluation             End of Session Equipment Utilized During Treatment: Gait belt Activity Tolerance: Patient tolerated treatment well Patient left: in bed;with call bell/phone within reach;with family/visitor present     Time: [id number] PT Time Calculation (min) (ACUTE ONLY): 28 min  Charges:  $Gait Training: 8-22 mins $Therapeutic Activity: 8-22 mins                    G Codes:      [place]ina April, PTA Pager: [phone]   11/02/2015, 3:12 PM

## 2015-11-02 NOTE — Clinical Social Work Placement (Signed)
   CLINICAL SOCIAL WORK PLACEMENT  NOTE  Date:  11/02/2015  Patient Details  Name: Devell Brinkerhoff MRN: [id number] Date of Birth: Sep 24, 1945  Clinical Social Work is seeking post-discharge placement for this patient at the [facility] level of care (*CSW will initial, date and re-position this form in  chart as items are completed):      Patient/family provided with [facility] Work Department's list of facilities offering this level of care within the geographic area requested by the patient (or if unable, by the patient's family).      Patient/family informed of their freedom to choose among providers that offer the needed level of care, that participate in Medicare, Medicaid or managed care program needed by the patient, have an available bed and are willing to accept the patient.      Patient/family informed of [facility]'s ownership interest in [facility] and [facility], as well as of the fact that they are under no obligation to receive care at these facilities.  PASRR submitted to EDS on 11/02/15     PASRR number received on 11/02/15     Existing PASRR number confirmed on       FL2 transmitted to all facilities in geographic area requested by pt/family on 11/02/15     FL2 transmitted to all facilities within larger geographic area on       Patient informed that his/her managed care company has contracts with or will negotiate with certain facilities, including the following:            Patient/family informed of bed offers received.  Patient chooses bed at       Physician recommends and patient chooses bed at      Patient to be transferred to   on  .  Patient to be transferred to facility by       Patient family notified on   of transfer.  Name of family member notified:        PHYSICIAN Please sign FL2     Additional Comment:    _______________________________________________ Benard Halsted, [place] 11/02/2015, 3:17  PM

## 2015-11-03 DIAGNOSIS — F419 Anxiety disorder, unspecified: Secondary | ICD-10-CM

## 2015-11-03 DIAGNOSIS — I10 Essential (primary) hypertension: Secondary | ICD-10-CM

## 2015-11-03 DIAGNOSIS — K219 Gastro-esophageal reflux disease without esophagitis: Secondary | ICD-10-CM

## 2015-11-03 DIAGNOSIS — T84018A Broken internal joint prosthesis, other site, initial encounter: Secondary | ICD-10-CM

## 2015-11-03 DIAGNOSIS — Z96649 Presence of unspecified artificial hip joint: Secondary | ICD-10-CM

## 2015-11-03 DIAGNOSIS — E271 Primary adrenocortical insufficiency: Secondary | ICD-10-CM

## 2015-11-03 LAB — POCT I-STAT 4, (NA,K, GLUC, HGB,HCT)
GLUCOSE: 118 mg/dL — AB (ref 65–99)
Glucose, Bld: 125 mg/dL — ABNORMAL HIGH (ref 65–99)
HCT: 33 % — ABNORMAL LOW (ref 39.0–52.0)
HEMATOCRIT: 26 % — AB (ref 39.0–52.0)
HEMOGLOBIN: 11.2 g/dL — AB (ref 13.0–17.0)
Hemoglobin: 8.8 g/dL — ABNORMAL LOW (ref 13.0–17.0)
POTASSIUM: 3.4 mmol/L — AB (ref 3.5–5.1)
Potassium: 3.4 mmol/L — ABNORMAL LOW (ref 3.5–5.1)
SODIUM: 142 mmol/L (ref 135–145)
Sodium: 143 mmol/L (ref 135–145)

## 2015-11-03 LAB — CBC
HCT: 21.5 % — ABNORMAL LOW (ref 39.0–52.0)
Hemoglobin: 7.3 g/dL — ABNORMAL LOW (ref 13.0–17.0)
MCH: 29.2 pg (ref 26.0–34.0)
MCHC: 34 g/dL (ref 30.0–36.0)
MCV: 86 fL (ref 78.0–100.0)
PLATELETS: 199 10*3/uL (ref 150–400)
RBC: 2.5 MIL/uL — ABNORMAL LOW (ref 4.22–5.81)
RDW: 15.2 % (ref 11.5–15.5)
WBC: 8.6 10*3/uL (ref 4.0–10.5)

## 2015-11-03 LAB — BASIC METABOLIC PANEL
Anion gap: 6 (ref 5–15)
BUN: 14 mg/dL (ref 6–20)
CO2: 25 mmol/L (ref 22–32)
CREATININE: 0.78 mg/dL (ref 0.61–1.24)
Calcium: 6.9 mg/dL — ABNORMAL LOW (ref 8.9–10.3)
Chloride: 107 mmol/L (ref 101–111)
GFR calc Af Amer: >60 mL/min (ref >60)
GLUCOSE: 112 mg/dL — AB (ref 65–99)
Potassium: 3.3 mmol/L — ABNORMAL LOW (ref 3.5–5.1)
SODIUM: 138 mmol/L (ref 135–145)

## 2015-11-03 LAB — PREPARE RBC (CROSSMATCH)

## 2015-11-03 MED ORDER — SODIUM CHLORIDE 0.9 % IV SOLN
Freq: Once | INTRAVENOUS | Status: AC
  Administered 2015-11-03: 10 mL via INTRAVENOUS

## 2015-11-03 NOTE — Progress Notes (Signed)
Physical Therapy Treatment Patient Details Name: John Lyons MRN: [id number] DOB: [birth year] Today's Date: 11/03/2015    History of Present Illness Patient is a 70 y/o male with hx of Addison's disease, HTN, HLD, anxiety and RA presents s/p left THA revision, acetabular and femoral components and ORIF left femur.    PT Comments    Pt very cooperative and motivated but ltd this pm by fatigue.  Pt with one unit of blood left on transfusion  Follow Up Recommendations  SNF     Equipment Recommendations  None recommended by PT    Recommendations for Other Services OT consult     Precautions / Restrictions Precautions Precautions: Posterior Hip Precaution Comments: Pt recalls 2/3 THP.  Reveiwed all with pt and family Restrictions Weight Bearing Restrictions: Yes LLE Weight Bearing: Touchdown weight bearing Other Position/Activity Restrictions: TDWB LLE    Mobility  Bed Mobility Overal bed mobility: Needs Assistance Bed Mobility: Supine to Sit;Sit to Supine     Supine to sit: Mod assist;+2 for physical assistance;+2 for safety/equipment Sit to supine: Mod assist;+2 for physical assistance;+2 for safety/equipment   General bed mobility comments: Cues for sequence and adherence to THP.  Pt with ltd use of hands and ltd knee flex on non-op LE - utilized pad to assist pt to EOB.  Transfers Overall transfer level: Needs assistance Equipment used: Rolling walker (2 wheeled) Transfers: Sit to/from Stand Sit to Stand: Mod assist;+2 safety/equipment;+2 physical assistance;From elevated surface         General transfer comment: Cues for LE management, use of UEs, and TWB.  Physical assist to bring wt up and fwd and to balance in standing wtih RW.  Pt stood x 2 min but not up to attempting step. with c/o mild dizziness  Ambulation/Gait             General Gait Details: Stood with RW and min/mod assist for balance/support x 2 min.  Amb deferred 2* pt c/o weakness and mild  dizziness   Stairs            Wheelchair Mobility    Modified Rankin (Stroke Patients Only)       Balance   Sitting-balance support: Feet supported;No upper extremity supported Sitting balance-Leahy Scale: Fair     Standing balance support: Bilateral upper extremity supported Standing balance-Leahy Scale: Poor Standing balance comment: min/mod assist for balance/support.  Pt able to maintain TWB on L LE                    Cognition Arousal/Alertness: Awake/alert Behavior During Therapy: WFL for tasks assessed/performed Overall Cognitive Status: Within Functional Limits for tasks assessed                      Exercises Total Joint Exercises Ankle Circles/Pumps: Both;Supine;15 reps;AROM Quad Sets: Both;10 reps;Supine Gluteal Sets: Both;10 reps;Supine Hip ABduction/ADduction: AAROM;Left;15 reps;Supine Straight Leg Raises: PROM;AAROM;Left;10 reps;Supine (With increased assist - pt unable to performe HS 2* ltd knee)    General Comments        Pertinent Vitals/Pain Pain Assessment: 0-10 Pain Score: 4  Pain Location: L hip Pain Descriptors / Indicators: Aching;Sore Pain Intervention(s): Limited activity within patient's tolerance;Monitored during session;Premedicated before session    Home Living                      Prior Function            PT Goals (current goals can now be  found in the care plan section) Acute Rehab PT Goals Patient Stated Goal: to get back to work and home ASAP PT Goal Formulation: With patient Time For Goal Achievement: 11/16/15 Potential to Achieve Goals: Fair Progress towards PT goals: Progressing toward goals    Frequency  7X/week    PT Plan Current plan remains appropriate    Co-evaluation             End of Session Equipment Utilized During Treatment: Gait belt Activity Tolerance: Patient limited by fatigue;Patient tolerated treatment well Patient left: in bed;with call bell/phone within  reach;with family/visitor present     Time: [id number] PT Time Calculation (min) (ACUTE ONLY): 40 min  Charges:  $Therapeutic Exercise: 8-22 mins $Therapeutic Activity: 8-22 mins                    G Codes:      Aime Meloche 11/15/15, 2:23 PM

## 2015-11-03 NOTE — Care Management Important Message (Signed)
Important Message  Patient Details  Name: John Lyons MRN: [id number] Date of Birth: [birth year]   Medicare Important Message Given:  Yes    [name] 11/03/2015, 11:09 AM

## 2015-11-03 NOTE — Progress Notes (Signed)
CONSULT PROGRESS NOTE                                                                                                                                                                                                             Patient Demographics:    John Lyons, is a 70 y.o. male, DOB - 1945-12-24, [id number]  Admit date - 11/01/2015   Admitting Physician Ivor Costa, MD  Outpatient Primary MD for the patient is [name], MD  LOS - 2    No chief complaint on file.      Brief Narrative  70 y.o. male hypertension, hyperlipidemia, GERD, anxiety, Addison's disease, rheumatoid arthritis, who is admitted by ortho, Dr. Lillia Corporal for revision of left total hip arthroplasty surgery, Status post surgical repair 7/10, patient with an episode of hypotension during surgery, he had consulted for medical management.  Subjective:    John Lyons today has, No headache, No chest pain, No abdominal pain - No Nausea, No new weakness tingling or numbness, No Cough - SOB.   Assessment  & Plan :    Principal Problem:   Failed total hip arthroplasty ([place]) Active Problems:   Essential hypertension   Addison's disease ([place])   Rheumatoid arthritis ([place])   Anxiety   GERD (gastroesophageal reflux disease)   Hyperlipidemia   Hypertension   Dysrhythmia   Failed total hip arthroplasty ([place]) - pt is s/p of revision of left total hip arthroplasty with both acetabular and femoral component posterior approach. - managed by ortho as primary team - Pain control per ortho  Anemia Hb is 7.3 today, likely post surgical. 2 units PRBC ordered per Ortho Follow CBC in am.  HTN: - Blood pressure has been acceptable to lower side today, will continue to hold amlodipine and Micardis.  GERD: - Protonix  Addison's disease:  -continue home Cortef and fludrocortisone   Rheumatoid arthritis ([place]): - continue Plaquenil  HLD -Continue home medications:  Pravastatin  Anxiety: -continue home Xanax  Arrhythmia - Diagnosis is not clear. Patient has been taking digoxin since 1978 per patient. - continue home digoxin     Lab Results  Component Value Date   PLT 199 11/03/2015    Antibiotics  :   Anti-infectives    Start     Dose/Rate Route Frequency Ordered Stop   11/02/15 1000  hydroxychloroquine (PLAQUENIL) tablet 200 mg     200 mg Oral Daily 11/02/15 0024     11/02/15 0024  vancomycin (VANCOCIN) IVPB 1000 mg/200 mL premix     1,000 mg  200 mL/hr over 60 Minutes Intravenous Every 12 hours 11/02/15 0024 11/02/15 0241   11/01/15 1200  ceFAZolin (ANCEF) IVPB 2g/100 mL premix     2 g 200 mL/hr over 30 Minutes Intravenous To ShortStay Surgical 10/29/15 0808 11/01/15 2130   11/01/15 1200  vancomycin (VANCOCIN) IVPB 1000 mg/200 mL premix     1,000 mg 200 mL/hr over 60 Minutes Intravenous To ShortStay Surgical 10/29/15 0808 11/01/15 1137        Objective:   Filed Vitals:   11/03/15 1021 11/03/15 1055 11/03/15 1149 11/03/15 1246  BP: 123/41 125/39 125/42 122/43  Pulse: 95 109 109 107  Temp: 100.7 F (38.2 C) 99.1 F (37.3 C) 99.8 F (37.7 C) 99.5 F (37.5 C)  TempSrc:  Oral Oral Oral  Resp: 16 16 18 16   Height:      Weight:      SpO2: 95% 96% 95% 97%    Wt Readings from Last 3 Encounters:  11/01/15 58.968 kg (130 lb)  10/22/15 59.058 kg (130 lb 3.2 oz)  08/10/15 58.968 kg (130 lb)     Intake/Output Summary (Last 24 hours) at 11/03/15 1344 Last data filed at 11/03/15 1031  Gross per 24 hour  Intake   3575 ml  Output    700 ml  Net   2875 ml     Physical Exam  Physical Exam: Eyes: No icterus, extraocular muscles intact  Mouth: Oral mucosa is moist, no lesions on palate,  Neck: Supple, no deformities, masses, or tenderness Lungs: Normal respiratory effort, bilateral clear to auscultation, no crackles or wheezes.  Heart: Regular rate and rhythm, S1 and S2 normal, no murmurs, rubs auscultated Abdomen: BS  normoactive,soft,nondistended,non-tender to palpation,no organomegaly Extremities: No pretibial edema, no erythema, no cyanosis, no clubbing Neuro : Alert and oriented to time, place and person, No focal deficits Skin: No rashes seen on exam     Data Review:    CBC  Recent Labs Lab 11/01/15 1316 11/01/15 1737 11/01/15 1958 11/02/15 0213 11/03/15 0409  WBC 9.9  --   --  9.8 8.6  HGB 9.8* 11.2* 8.8* 9.8* 7.3*  HCT 29.4* 33.0* 26.0* 29.3* 21.5*  PLT 208  --   --  203 199  MCV 86.5  --   --  86.2 86.0  MCH 28.8  --   --  28.8 29.2  MCHC 33.3  --   --  33.4 34.0  RDW 14.3  --   --  14.3 15.2    Chemistries   Recent Labs Lab 11/01/15 1737 11/01/15 1958 11/02/15 0213 11/03/15 0409  NA 142 143 141 138  K 3.4* 3.4* 3.4* 3.3*  CL  --   --  106 107  CO2  --   --  24 25  GLUCOSE 125* 118* 182* 112*  BUN  --   --  8 14  CREATININE  --   --  0.63  0.66 0.78  CALCIUM  --   --  7.5* 6.9*   ------------------------------------------------------------------------------------------------------------------   Inpatient Medications  Scheduled Meds: . ALPRAZolam  0.25 mg Oral QHS  . antiseptic oral rinse  7 mL Mouth Rinse BID  . apixaban  2.5 mg Oral BID  . cholecalciferol  1,000 Units Oral Daily  . digoxin  0.125 mg Oral Daily  . docusate sodium  100 mg Oral BID  . fludrocortisone  0.1 mg Oral Daily  . folic acid  1 mg Oral Daily  . hydrocortisone  20 mg Oral Daily  .  hydroxychloroquine  200 mg Oral Daily  . pantoprazole  40 mg Oral Daily  . pravastatin  20 mg Oral Daily  . senna  2 tablet Oral QHS   Continuous Infusions: . sodium chloride 125 mL/hr (11/03/15 0140)   PRN Meds:.acetaminophen **OR** acetaminophen, HYDROcodone-acetaminophen, HYDROmorphone (DILAUDID) injection, magnesium citrate, menthol-cetylpyridinium **OR** phenol, methocarbamol **OR** methocarbamol (ROBAXIN)  IV, metoCLOPramide **OR** metoCLOPramide (REGLAN) injection, ondansetron **OR** ondansetron  (ZOFRAN) IV, polyethylene glycol, polyvinyl alcohol, sorbitol  Micro Results Recent Results (from the past 240 hour(s))  Aerobic/Anaerobic Culture (surgical/deep wound)     Status: None (Preliminary result)   Collection Time: 11/01/15  3:16 PM  Result Value Ref Range Status   Specimen Description SYNOVIAL LEFT HIP  Final   Special Requests FLUID ON SWAB PATIENT ON FOLLOWING ANCEF  Final   Gram Stain   Final    RARE WBC PRESENT, PREDOMINANTLY PMN NO ORGANISMS SEEN    Culture NO GROWTH 2 DAYS  Final   Report Status PENDING  Incomplete    Radiology Reports Dg Chest 2 View  11/01/2015  CLINICAL DATA:  Preoperative total hip replacement. Cough and congestion EXAM: CHEST  2 VIEW COMPARISON:  August 11, 2015 FINDINGS: There is no edema or consolidation. The heart size and pulmonary vascularity are normal. No adenopathy. There is advanced arthropathy in both shoulders. IMPRESSION: No edema or consolidation. Electronically Signed   By: Lowella Grip III M.D.   On: 11/01/2015 10:59   Dg Pelvis Portable  11/02/2015  CLINICAL DATA:  Postoperative left hip revision. EXAM: PORTABLE PELVIS 1-2 VIEWS COMPARISON:  Intraoperative fluoroscopy 11/01/2015 FINDINGS: Postoperative left total hip arthroplasty using long-stem femoral component with additional acetabular prosthesis superiorly. Plate and screw fixation of the visualize left femoral shaft with cerclage wires present. Distal portion of the surgical hardware in the left femur is not included within the field of view. Visualize components appear well seated. Previous right total hip arthroplasty using cemented components. There is lucency at the bone-cement interface in the acetabular region which may suggest loosening. No dislocation. Visualized pelvis appears intact. IMPRESSION: Postoperative changes in both hips as described. Electronically Signed   By: Lucienne Capers M.D.   On: 11/02/2015 00:24   Dg C-arm 61-120 Min  11/01/2015  CLINICAL DATA:   Left total hip arthroplasty with internal fixation left femoral fracture. EXAM: DG C-ARM 61-120 MIN; LEFT FEMUR 2 VIEWS COMPARISON:  Left hip 05/18/2015 FINDINGS: Intraoperative fluoroscopy is obtained for surgical control purposes during orthopedic procedure. Fluoroscopy time is recorded at 2 minutes 29 seconds. Seven spot fluoroscopic images are obtained. Spot fluoroscopic images obtained of the left hip and left femur the demonstrate placement of a left total hip arthroplasty with long-stem femoral component and with additional acetabular prosthesis. Bone matrix of the femur is demineralized. Multiple cerclage wires are utilized. Lateral plate and screw fixation of the femoral shaft. Left knee arthroplasty. Visualized components appear grossly well-seated. IMPRESSION: Intraoperative fluoroscopy obtained for surgical control purposes demonstrating left hip and left knee arthroplasties with plate and screw fixation of the left femur. Electronically Signed   By: Lucienne Capers M.D.   On: 11/01/2015 21:37   Dg Femur Min 2 Views Left  11/01/2015  CLINICAL DATA:  Left total hip arthroplasty with internal fixation left femoral fracture. EXAM: DG C-ARM 61-120 MIN; LEFT FEMUR 2 VIEWS COMPARISON:  Left hip 05/18/2015 FINDINGS: Intraoperative fluoroscopy is obtained for surgical control purposes during orthopedic procedure. Fluoroscopy time is recorded at 2 minutes 29 seconds. Seven spot fluoroscopic images are  obtained. Spot fluoroscopic images obtained of the left hip and left femur the demonstrate placement of a left total hip arthroplasty with long-stem femoral component and with additional acetabular prosthesis. Bone matrix of the femur is demineralized. Multiple cerclage wires are utilized. Lateral plate and screw fixation of the femoral shaft. Left knee arthroplasty. Visualized components appear grossly well-seated. IMPRESSION: Intraoperative fluoroscopy obtained for surgical control purposes demonstrating left  hip and left knee arthroplasties with plate and screw fixation of the left femur. Electronically Signed   By: Lucienne Capers M.D.   On: 11/01/2015 21:37   Dg Femur Port Min 2 Views Left  11/02/2015  CLINICAL DATA:  Postoperative evaluation. EXAM: LEFT FEMUR PORTABLE 2 VIEWS COMPARISON: Pelvic radiograph November 01, 2015 FINDINGS: Status post LEFT hip total arthroplasty. Lateral plate and screw fixation with cerclage wires. [employer] LEFT greater trochanter, stable. Status post total knee arthroplasty. Osteopenia. Surgical drain in place, subcutaneous gas and overlying skin staples. Included view of the RIGHT femur demonstrates old distal fracture. Moderate vascular calcifications. Cachexia. IMPRESSION: Status post LEFT total hip arthroplasty, LEFT femur plate and screw fixation, LEFT total knee arthroplasty. Surgical drain in place. Electronically Signed   By: Elon Alas M.D.   On: 11/02/2015 01:20    Time Spent in minutes  25 minutes   Alcie Runions S M.D on 11/03/2015 at 1:44 PM  Between 7am to 7pm - Pager - [phone]  After 7pm go to [email or web address] - password [facility]  [facility] -  Office  [phone]

## 2015-11-03 NOTE — Discharge Summary (Signed)
Physician Discharge Summary  Patient ID: John Lyons MRN: [id number] DOB/AGE: December 28, 1945 70 y.o.  Admit date: 11/01/2015 Discharge date: 11/05/2015  Admission Diagnoses:  Failed total hip arthroplasty [facility])  Discharge Diagnoses:  Principal Problem:   Failed total hip arthroplasty [facility]) Active Problems:   Essential hypertension   Addison's disease ([place])   Rheumatoid arthritis ([place])   Anxiety   GERD (gastroesophageal reflux disease)   Hyperlipidemia   Hypertension   Dysrhythmia   Past Medical History  Diagnosis Date  . Addison's disease ([place])     takes Florinef and Cortef daily  . PONV (postoperative nausea and vomiting)   . H/O hiatal hernia   . Constipation     takes Linzess daily and Miralax as needed as well as Colace  . Dysrhythmia     takes Digoxin daily  . Anxiety     takes Xanax at bedtime  . GERD (gastroesophageal reflux disease)     takes Pantoprazole daily  . Hyperlipidemia     takes Pravastatin daily  . Hypertension     takes Amlodipine and Micardis daily  . Weakness     right hand.Knot on right hand,states came up 2 wks ago,=  . Joint pain   . Joint swelling   . Rheumatoid arthritis(714.0)     Rheumatoid-takes Reclast yearly and Plaquenil daily  . Dry skin     on legs  . History of colon polyps     benign  . Diverticulosis   . History of blood transfusion     Surgeries: Procedure(s): REVISION OF LEFT TOTAL HIP ARTHROPLASTY WITH BOTH ACETABULAR AND FEMORAL COMPONENTS POSTERIOR APPROACH on 11/01/2015 - 11/02/2015   Consultants (if any): Treatment Team:  Albertine Patricia, MD  Discharged Condition: Improved  Hospital Course: John Lyons is an 70 y.o. male who was admitted 11/01/2015 with a diagnosis of Failed total hip arthroplasty ([place]) and went to the operating room on 11/01/2015 and underwent the above named procedures.    He was given perioperative antibiotics:      Anti-infectives    Start     Dose/Rate Route Frequency Ordered Stop    11/02/15 1000  hydroxychloroquine (PLAQUENIL) tablet 200 mg     200 mg Oral Daily 11/02/15 0024     11/02/15 0024  vancomycin (VANCOCIN) IVPB 1000 mg/200 mL premix     1,000 mg 200 mL/hr over 60 Minutes Intravenous Every 12 hours 11/02/15 0024 11/02/15 0241   11/01/15 1200  ceFAZolin (ANCEF) IVPB 2g/100 mL premix     2 g 200 mL/hr over 30 Minutes Intravenous To [facility] 10/29/15 0808 11/01/15 2130   11/01/15 1200  vancomycin (VANCOCIN) IVPB 1000 mg/200 mL premix     1,000 mg 200 mL/hr over 60 Minutes Intravenous To [facility]Stay [facility] 10/29/15 0808 11/01/15 1137    .  He was made touch down weight bearing. He was given sequential compression devices and apixaban for DVT prophylaxis. The hospitalist was consulted for medical management including stress dose steroids for Addison's disease. On POD#2 he was given 2 units PRBCs for symptomatic acute blood loss anemia. He ran a low grade temperature, so a CT chest was obtained to rule out PE. Temperature believed to be due to atelectasis.   He benefited maximally from the hospital stay and there were no complications.    Recent vital signs:  Filed Vitals:   11/05/15 0351 11/05/15 0352  BP: 121/44 114/45  Pulse: 97   Temp: 99.5 F (37.5 C)   Resp: 19  Recent laboratory studies:  Lab Results  Component Value Date   HGB 8.9* 11/05/2015   HGB 9.6* 11/04/2015   HGB 7.3* 11/03/2015   Lab Results  Component Value Date   WBC 10.4 11/05/2015   PLT 208 11/05/2015   Lab Results  Component Value Date   INR 1.15 08/11/2015   Lab Results  Component Value Date   NA 136 11/05/2015   K 3.6 11/05/2015   CL 104 11/05/2015   CO2 24 11/05/2015   BUN 6 11/05/2015   CREATININE 0.42* 11/05/2015   GLUCOSE 88 11/05/2015    Discharge Medications:     Medication List    STOP taking these medications        acetaminophen 500 MG tablet  Commonly known as:  TYLENOL     amLODipine 2.5 MG tablet  Commonly known as:  NORVASC      oxyCODONE-acetaminophen 5-325 MG tablet  Commonly known as:  PERCOCET     RECLAST 5 MG/100ML Soln injection  Generic drug:  zoledronic acid     telmisartan 80 MG tablet  Commonly known as:  MICARDIS     traMADol 50 MG tablet  Commonly known as:  ULTRAM      TAKE these medications        ALPRAZolam 0.25 MG tablet  Commonly known as:  XANAX  Take 0.25 mg by mouth at bedtime.     apixaban 2.5 MG Tabs tablet  Commonly known as:  ELIQUIS  Take 1 tablet (2.5 mg total) by mouth 2 (two) times daily.     diazepam 5 MG tablet  Commonly known as:  VALIUM  Take 0.5 tablets (2.5 mg total) by mouth every 6 (six) hours as needed for muscle spasms.     digoxin 0.125 MG tablet  Commonly known as:  LANOXIN  Take 0.125 mg by mouth daily.     docusate sodium 100 MG capsule  Commonly known as:  COLACE  Take 1 capsule (100 mg total) by mouth 2 (two) times daily.     fludrocortisone 0.1 MG tablet  Commonly known as:  FLORINEF  Take 0.1 mg by mouth daily.     folic acid A999333 MCG tablet  Commonly known as:  FOLVITE  Take 400 mcg by mouth daily.     HYDROcodone-acetaminophen 5-325 MG tablet  Commonly known as:  NORCO  Take 1-2 tablets by mouth every 4 (four) hours as needed for moderate pain.     hydrocortisone 20 MG tablet  Commonly known as:  CORTEF  Take 20 mg by mouth daily.     hydroxychloroquine 200 MG tablet  Commonly known as:  PLAQUENIL  Take 200 mg by mouth daily.     LINZESS 145 MCG Caps capsule  Generic drug:  linaclotide  Take 145 mcg by mouth daily as needed (constipation).     multivitamin with minerals Tabs tablet  Take 1 tablet by mouth daily.     ondansetron 4 MG tablet  Commonly known as:  ZOFRAN  Take 1 tablet (4 mg total) by mouth every 6 (six) hours as needed for nausea.     pantoprazole 40 MG tablet  Commonly known as:  PROTONIX  Take 40 mg by mouth daily.     polyethylene glycol packet  Commonly known as:  MIRALAX / GLYCOLAX  Take 17 g by  mouth daily as needed for mild constipation.     pravastatin 20 MG tablet  Commonly known as:  PRAVACHOL  Take 20 mg  by mouth daily.     senna 8.6 MG Tabs tablet  Commonly known as:  SENOKOT  Take 2 tablets (17.2 mg total) by mouth at bedtime.     SOOTHE 0.6-0.6 % Soln  Generic drug:  Propylene Glycol-Glycerin  Place 1 drop into both eyes daily as needed (dryness).     Vitamin D3 1000 units Caps  Take 1,000 Units by mouth daily.        Diagnostic Studies: Dg Chest 2 View  11/01/2015  CLINICAL DATA:  Preoperative total hip replacement. Cough and congestion EXAM: CHEST  2 VIEW COMPARISON:  August 11, 2015 FINDINGS: There is no edema or consolidation. The heart size and pulmonary vascularity are normal. No adenopathy. There is advanced arthropathy in both shoulders. IMPRESSION: No edema or consolidation. Electronically Signed   By: Lowella Grip III M.D.   On: 11/01/2015 10:59   Ct Angio Chest Pe W Or Wo Contrast  11/05/2015  CLINICAL DATA:  Shortness of Breath, history of hip replacement and femur fixation EXAM: CT ANGIOGRAPHY CHEST WITH CONTRAST TECHNIQUE: Multidetector CT imaging of the chest was performed using the standard protocol during bolus administration of intravenous contrast. Multiplanar CT image reconstructions and MIPs were obtained to evaluate the vascular anatomy. CONTRAST:  100 mL Isovue 370. COMPARISON:  None. FINDINGS: Mediastinum/Lymph Nodes: No significant lymphadenopathy is noted. The thoracic inlet is unremarkable. Cardiovascular: The thoracic aorta is well visualized and demonstrates no evidence of aneurysmal dilatation or dissection. Mild coronary calcifications are seen. The pulmonary artery is well visualized without evidence of filling defect to suggest pulmonary embolus. Cardiac structures are otherwise within normal limits. Lungs/Pleura: Small pleural effusions are noted bilaterally. No focal infiltrate or sizable pneumothorax is seen. No parenchymal nodules are  noted. Upper abdomen: No acute findings. Musculoskeletal: Mild degenerative change of the thoracic spine is noted. Review of the MIP images confirms the above findings. IMPRESSION: Small pleural effusions bilaterally. No evidence of pulmonary emboli. No other focal abnormality is seen. Electronically Signed   By: Inez Catalina M.D.   On: 11/05/2015 09:45   Dg Pelvis Portable  11/02/2015  CLINICAL DATA:  Postoperative left hip revision. EXAM: PORTABLE PELVIS 1-2 VIEWS COMPARISON:  Intraoperative fluoroscopy 11/01/2015 FINDINGS: Postoperative left total hip arthroplasty using long-stem femoral component with additional acetabular prosthesis superiorly. Plate and screw fixation of the visualize left femoral shaft with cerclage wires present. Distal portion of the surgical hardware in the left femur is not included within the field of view. Visualize components appear well seated. Previous right total hip arthroplasty using cemented components. There is lucency at the bone-cement interface in the acetabular region which may suggest loosening. No dislocation. Visualized pelvis appears intact. IMPRESSION: Postoperative changes in both hips as described. Electronically Signed   By: Lucienne Capers M.D.   On: 11/02/2015 00:24   Dg C-arm 61-120 Min  11/01/2015  CLINICAL DATA:  Left total hip arthroplasty with internal fixation left femoral fracture. EXAM: DG C-ARM 61-120 MIN; LEFT FEMUR 2 VIEWS COMPARISON:  Left hip 05/18/2015 FINDINGS: Intraoperative fluoroscopy is obtained for surgical control purposes during orthopedic procedure. Fluoroscopy time is recorded at 2 minutes 29 seconds. Seven spot fluoroscopic images are obtained. Spot fluoroscopic images obtained of the left hip and left femur the demonstrate placement of a left total hip arthroplasty with long-stem femoral component and with additional acetabular prosthesis. Bone matrix of the femur is demineralized. Multiple cerclage wires are utilized. Lateral plate  and screw fixation of the femoral shaft. Left knee arthroplasty. Visualized  components appear grossly well-seated. IMPRESSION: Intraoperative fluoroscopy obtained for surgical control purposes demonstrating left hip and left knee arthroplasties with plate and screw fixation of the left femur. Electronically Signed   By: Lucienne Capers M.D.   On: 11/01/2015 21:37   Dg Femur Min 2 Views Left  11/01/2015  CLINICAL DATA:  Left total hip arthroplasty with internal fixation left femoral fracture. EXAM: DG C-ARM 61-120 MIN; LEFT FEMUR 2 VIEWS COMPARISON:  Left hip 05/18/2015 FINDINGS: Intraoperative fluoroscopy is obtained for surgical control purposes during orthopedic procedure. Fluoroscopy time is recorded at 2 minutes 29 seconds. Seven spot fluoroscopic images are obtained. Spot fluoroscopic images obtained of the left hip and left femur the demonstrate placement of a left total hip arthroplasty with long-stem femoral component and with additional acetabular prosthesis. Bone matrix of the femur is demineralized. Multiple cerclage wires are utilized. Lateral plate and screw fixation of the femoral shaft. Left knee arthroplasty. Visualized components appear grossly well-seated. IMPRESSION: Intraoperative fluoroscopy obtained for surgical control purposes demonstrating left hip and left knee arthroplasties with plate and screw fixation of the left femur. Electronically Signed   By: Lucienne Capers M.D.   On: 11/01/2015 21:37   Dg Femur Port Min 2 Views Left  11/02/2015  CLINICAL DATA:  Postoperative evaluation. EXAM: LEFT FEMUR PORTABLE 2 VIEWS COMPARISON: Pelvic radiograph November 01, 2015 FINDINGS: Status post LEFT hip total arthroplasty. Lateral plate and screw fixation with cerclage wires. [place] LEFT greater trochanter, stable. Status post total knee arthroplasty. Osteopenia. Surgical drain in place, subcutaneous gas and overlying skin staples. Included view of the RIGHT femur demonstrates old distal fracture.  Moderate vascular calcifications. Cachexia. IMPRESSION: Status post LEFT total hip arthroplasty, LEFT femur plate and screw fixation, LEFT total knee arthroplasty. Surgical drain in place. Electronically Signed   By: Elon Alas M.D.   On: 11/02/2015 01:20    Disposition: 01-Home or Self Care  Discharge Instructions    Call MD / Call 911    Complete by:  As directed   If you experience chest pain or shortness of breath, CALL 911 and be transported to the hospital emergency room.  If you develope a fever above 101 F, pus (white drainage) or increased drainage or redness at the wound, or calf pain, call your surgeon's office.     Constipation Prevention    Complete by:  As directed   Drink plenty of fluids.  Prune juice may be helpful.  You may use a stool softener, such as Colace (over the counter) 100 mg twice a day.  Use MiraLax (over the counter) for constipation as needed.     Diet - low sodium heart healthy    Complete by:  As directed      Discharge instructions    Complete by:  As directed   Touch down weight bearing left leg     Driving restrictions    Complete by:  As directed   No driving for 6 weeks     Follow the hip precautions as taught in Physical Therapy    Complete by:  As directed      Increase activity slowly as tolerated    Complete by:  As directed      Lifting restrictions    Complete by:  As directed   No lifting for 6 weeks     TED hose    Complete by:  As directed   Use stockings (TED hose) for 2 weeks on both leg(s).  You may remove  them at night for sleeping.              Signed: Elie Goody 11/05/2015, 3:08 PM

## 2015-11-03 NOTE — Progress Notes (Signed)
OT Cancellation Note  Patient Details Name: John Lyons MRN: [id number] DOB: 1946-04-04   Cancelled Treatment:    Reason Eval/Treat Not Completed: Other (comment) Pt is Medicare and current D/C plan is SNF. No apparent immediate acute care OT needs, therefore will defer OT to SNF. If OT eval is needed please call Acute Rehab Dept. at [phone] or text page OT at [phone].    Almon Register [id number] 11/03/2015, 7:02 AM

## 2015-11-03 NOTE — Discharge Instructions (Signed)

## 2015-11-03 NOTE — Progress Notes (Addendum)
   Subjective:  Patient reports pain as mild to moderate.  C/o nausea, no emesis. Feels weak this am. Denies CP/SOB.  Objective:   VITALS:   Filed Vitals:   11/02/15 1951 11/02/15 1953 11/02/15 2213 11/03/15 0457  BP: 97/36 101/36 109/48 125/52  Pulse: 86   104  Temp: 97.7 F (36.5 C)   99.7 F (37.6 C)  TempSrc: Axillary   Oral  Resp:    16  Height:      Weight:      SpO2: 94%   94%    ABD soft Sensation intact distally Intact pulses distally Dorsiflexion/Plantar flexion intact Incision: dressing C/D/I Compartment soft HV ss  Lab Results  Component Value Date   WBC 8.6 11/03/2015   HGB 7.3* 11/03/2015   HCT 21.5* 11/03/2015   MCV 86.0 11/03/2015   PLT 199 11/03/2015   BMET    Component Value Date/Time   NA 138 11/03/2015 0409   K 3.3* 11/03/2015 0409   CL 107 11/03/2015 0409   CO2 25 11/03/2015 0409   GLUCOSE 112* 11/03/2015 0409   BUN 14 11/03/2015 0409   CREATININE 0.78 11/03/2015 0409   CALCIUM 6.9* 11/03/2015 0409   GFRNONAA >60 11/03/2015 0409   GFRAA >60 11/03/2015 0409    Intraop culture: NGTD   Assessment/Plan: 2 Days Post-Op   Principal Problem:   Failed total hip arthroplasty ([facility]) Active Problems:   Essential hypertension   Addison's disease ([facility])   Rheumatoid arthritis ([place])   Anxiety   GERD (gastroesophageal reflux disease)   Hyperlipidemia   Hypertension   Dysrhythmia   TDWB LLE Posterior hip precautions DVT ppx: apixaban, SCDs, TEDs PT/OT Appreciate hospitalist recs D/c HV drain  ABLA: hgb 7.3 symptomatic with hypotension and tachycardia, will transfuse 2 units PRBCs Dispo: SNF placement, likely Thursday   John Lyons 11/03/2015, 7:39 AM   Rod Can, MD Cell [phone]

## 2015-11-03 NOTE — Clinical Social Work Note (Signed)
Clinical Social Work Assessment  Patient Details  Name: John Lyons MRN: [id number] Date of Birth: 10-Sep-1945  Date of referral:  11/03/15               Reason for consult:  Facility Placement, Discharge Planning                Permission sought to share information with:  Chartered certified accountant granted to share information::  Yes, Verbal Permission Granted  Name::        Agency::  [employer]  Relationship::     Contact Information:     Housing/Transportation Living arrangements for the past 2 months:  Single Family Home Source of Information:  Patient, Adult Children, Spouse Patient Interpreter Needed:  None Criminal Activity/Legal Involvement Pertinent to Current Situation/Hospitalization:  No - Comment as needed Significant Relationships:  Adult Children, Spouse Lives with:  Spouse Do you feel safe going back to the place where you live?  No Need for family participation in patient care:  Yes (Comment) (Patient's wife and patient's daughter.)  Care giving concerns:  Patient nor patient's family had concerns at this time.   Social Worker assessment / plan:  LCSW received referral for possible SNF placement at time of discharge. LCSW met with patient at bedside to discuss discharge planning needs. Patient and patient's family expressed interest in [facility]. LCSW to continue to follow and assist with discharge planning needs.  Employment status:  Retired Science writer) PT Recommendations:  [place] / Referral to community resources:  [place]  Patient/Family's Response to care:  Patient and patient's family understanding and agreeable to [employer] plan of care.  Patient/Family's Understanding of and Emotional Response to Diagnosis, Current Treatment, and Prognosis:  Patient and patient's family understanding and agreeable to LCSW plan of care.  Emotional  Assessment Appearance:  Appears stated age Attitude/Demeanor/Rapport:  Other (Appropriate) Affect (typically observed):  Accepting, Appropriate, Pleasant Orientation:  Oriented to Self, Oriented to Place, Oriented to  Time, Oriented to Situation Alcohol / Substance use:  Not Applicable Psych involvement (Current and /or in the community):  No (Comment) (Not appropriate on this admission.)  Discharge Needs  Concerns to be addressed:  No discharge needs identified Readmission within the last 30 days:  No Current discharge risk:  None Barriers to Discharge:  No Barriers Identified   Caroline Sauger, LCSW 11/03/2015, 12:50 PM [phone]

## 2015-11-03 NOTE — Progress Notes (Signed)
Physical Therapy Treatment Patient Details Name: John Lyons MRN: [id number] DOB: March 28, 1946 Today's Date: 11-18-2015    History of Present Illness Patient is a 70 y/o male with hx of Addison's disease, HTN, HLD, anxiety and RA presents s/p left THA revision, acetabular and femoral components and ORIF left femur.    PT Comments    Tx this am limited to bed therex - pt with Hgb@7 .3 with transfusion scheduled  Follow Up Recommendations  SNF     Equipment Recommendations  None recommended by PT    Recommendations for Other Services OT consult     Precautions / Restrictions Precautions Precautions: Posterior Hip Precaution Comments: Pt recalls 1/3 THP.  Reveiwed all with pt and family Restrictions Weight Bearing Restrictions: Yes LLE Weight Bearing: Touchdown weight bearing Other Position/Activity Restrictions: TDWB LLE    Mobility  Bed Mobility               General bed mobility comments: OOB deferred - Hgb at 7.2 with transfusion ordered  Transfers                    Ambulation/Gait                 Stairs            Wheelchair Mobility    Modified Rankin (Stroke Patients Only)       Balance                                    Cognition Arousal/Alertness: Awake/alert Behavior During Therapy: WFL for tasks assessed/performed Overall Cognitive Status: Within Functional Limits for tasks assessed                      Exercises Total Joint Exercises Ankle Circles/Pumps: Both;Supine;15 reps;AROM Quad Sets: Both;10 reps;Supine Gluteal Sets: Both;10 reps;Supine Hip ABduction/ADduction: AAROM;Left;15 reps;Supine Straight Leg Raises: PROM;AAROM;Left;10 reps;Supine (unable to perform HS 2* no movement at knee. )    General Comments        Pertinent Vitals/Pain Pain Assessment: 0-10 Pain Score: 5  Pain Location: L hip Pain Descriptors / Indicators: Aching;Sore Pain Intervention(s): Limited activity within  patient's tolerance;Monitored during session;Premedicated before session;Ice applied    Home Living                      Prior Function            PT Goals (current goals can now be found in the care plan section) Acute Rehab PT Goals Patient Stated Goal: to get back to work and home ASAP PT Goal Formulation: With patient Time For Goal Achievement: 11/16/15 Potential to Achieve Goals: Fair Progress towards PT goals: Progressing toward goals    Frequency  7X/week    PT Plan Current plan remains appropriate    Co-evaluation             End of Session Equipment Utilized During Treatment: Gait belt Activity Tolerance: Patient tolerated treatment well Patient left: in bed;with call bell/phone within reach;with family/visitor present     Time: [id number] PT Time Calculation (min) (ACUTE ONLY): 22 min  Charges:  $Therapeutic Exercise: 8-22 mins                    G Codes:      Wendelin Bradt Nov 18, 2015, 12:14 PM

## 2015-11-04 ENCOUNTER — Encounter (HOSPITAL_COMMUNITY): Payer: Self-pay | Admitting: Orthopedic Surgery

## 2015-11-04 LAB — TYPE AND SCREEN
ABO/RH(D): A POS
ANTIBODY SCREEN: NEGATIVE
UNIT DIVISION: 0
UNIT DIVISION: 0
UNIT DIVISION: 0
Unit division: 0
Unit division: 0

## 2015-11-04 LAB — CBC
HCT: 27.4 % — ABNORMAL LOW (ref 39.0–52.0)
Hemoglobin: 9.6 g/dL — ABNORMAL LOW (ref 13.0–17.0)
MCH: 30.1 pg (ref 26.0–34.0)
MCHC: 35 g/dL (ref 30.0–36.0)
MCV: 85.9 fL (ref 78.0–100.0)
PLATELETS: 159 10*3/uL (ref 150–400)
RBC: 3.19 MIL/uL — ABNORMAL LOW (ref 4.22–5.81)
RDW: 14.7 % (ref 11.5–15.5)
WBC: 9.8 10*3/uL (ref 4.0–10.5)

## 2015-11-04 MED ORDER — POTASSIUM CHLORIDE CRYS ER 20 MEQ PO TBCR
40.0000 meq | EXTENDED_RELEASE_TABLET | Freq: Once | ORAL | Status: AC
  Administered 2015-11-04: 40 meq via ORAL
  Filled 2015-11-04: qty 2

## 2015-11-04 NOTE — Progress Notes (Addendum)
CONSULT PROGRESS NOTE                                                                                                                                                                                                             Patient Demographics:    John Lyons, is a 70 y.o. male, DOB - Jan 15, 1946, [id number]  Admit date - 11/01/2015   Admitting Physician Ivor Costa, MD  Outpatient Primary MD for the patient is [name], MD  LOS - 3    No chief complaint on file.      Brief Narrative  70 y.o. male hypertension, hyperlipidemia, GERD, anxiety, Addison's disease, rheumatoid arthritis, who is admitted by ortho, Dr. Lillia Corporal for revision of left total hip arthroplasty surgery, Status post surgical repair 7/10, patient with an episode of hypotension during surgery, he had consulted for medical management.  Subjective:    [name] today has, No headache, No chest pain, No abdominal pain - No Nausea, No new weakness tingling or numbness, No Cough - SOB.    Assessment  & Plan :    Principal Problem:   Failed total hip arthroplasty ([place]) Active Problems:   Essential hypertension   Addison's disease ([place])   Rheumatoid arthritis ([place])   Anxiety   GERD (gastroesophageal reflux disease)   Hyperlipidemia   Hypertension   Dysrhythmia   Failed total hip arthroplasty ([place]) - pt is s/p of revision of left total hip arthroplasty with both acetabular and femoral component posterior approach. - managed by ortho as primary team - Pain control per ortho  Anemia Hb is 9.6 today, s/p 2 units PRBC  HTN: - Blood pressure has been stable, will discontinue  amlodipine and Micardis at discharge. Can restart these medications if blood pressure starts to rise.  GERD: - Protonix  Addison's disease:  -continue home Cortef and fludrocortisone   Rheumatoid arthritis ([place]): - continue Plaquenil  HLD -Continue home medications:  Pravastatin  Anxiety: -continue home Xanax  Arrhythmia - Diagnosis is not clear. Patient has been taking digoxin since 1978 per patient. - continue home digoxin  Hypokalemia- replace potassium and check bmp in am.  Will sign off , call us again if needed   Lab Results  Component Value Date   PLT 159 11/04/2015    Antibiotics  :   Anti-infectives    Start     Dose/Rate Route Frequency Ordered Stop   11/02/15 1000  hydroxychloroquine (PLAQUENIL) tablet 200 mg     200 mg Oral Daily 11/02/15 0024  11/02/15 0024  vancomycin (VANCOCIN) IVPB 1000 mg/200 mL premix     1,000 mg 200 mL/hr over 60 Minutes Intravenous Every 12 hours 11/02/15 0024 11/02/15 0241   11/01/15 1200  ceFAZolin (ANCEF) IVPB 2g/100 mL premix     2 g 200 mL/hr over 30 Minutes Intravenous To [facility] 10/29/15 0808 11/01/15 2130   11/01/15 1200  vancomycin (VANCOCIN) IVPB 1000 mg/200 mL premix     1,000 mg 200 mL/hr over 60 Minutes Intravenous To [facility] 10/29/15 0808 11/01/15 1137        Objective:   Filed Vitals:   11/03/15 1714 11/03/15 1928 11/03/15 2145 11/04/15 0700  BP: 131/45 137/51 123/50 128/57  Pulse: 110 110 98 91  Temp: 100.8 F (38.2 C) 101 F (38.3 C) 100.4 F (38 C) 99.5 F (37.5 C)  TempSrc: Oral Oral Oral Oral  Resp: 16 16 16 16   Height:      Weight:      SpO2: 95% 94% 96% 97%    Wt Readings from Last 3 Encounters:  11/01/15 58.968 kg (130 lb)  10/22/15 59.058 kg (130 lb 3.2 oz)  08/10/15 58.968 kg (130 lb)     Intake/Output Summary (Last 24 hours) at 11/04/15 1111 Last data filed at 11/03/15 1714  Gross per 24 hour  Intake    335 ml  Output    800 ml  Net   -465 ml     Physical Exam  Physical Exam: Eyes: No icterus, extraocular muscles intact  Mouth: Oral mucosa is moist, no lesions on palate,  Neck: Supple, no deformities, masses, or tenderness Lungs: Normal respiratory effort, bilateral clear to auscultation, no crackles or wheezes.   Heart: Regular rate and rhythm, S1 and S2 normal, no murmurs, rubs auscultated Abdomen: BS normoactive,soft,nondistended,non-tender to palpation,no organomegaly Extremities: No pretibial edema, no erythema, no cyanosis, no clubbing Neuro : Alert and oriented to time, place and person, No focal deficits Skin: No rashes seen on exam     Data Review:    CBC  Recent Labs Lab 11/01/15 1316 11/01/15 1737 11/01/15 1958 11/02/15 0213 11/03/15 0409 11/04/15 0643  WBC 9.9  --   --  9.8 8.6 9.8  HGB 9.8* 11.2* 8.8* 9.8* 7.3* 9.6*  HCT 29.4* 33.0* 26.0* 29.3* 21.5* 27.4*  PLT 208  --   --  203 199 159  MCV 86.5  --   --  86.2 86.0 85.9  MCH 28.8  --   --  28.8 29.2 30.1  MCHC 33.3  --   --  33.4 34.0 35.0  RDW 14.3  --   --  14.3 15.2 14.7    Chemistries   Recent Labs Lab 11/01/15 1737 11/01/15 1958 11/02/15 0213 11/03/15 0409  NA 142 143 141 138  K 3.4* 3.4* 3.4* 3.3*  CL  --   --  106 107  CO2  --   --  24 25  GLUCOSE 125* 118* 182* 112*  BUN  --   --  8 14  CREATININE  --   --  0.63  0.66 0.78  CALCIUM  --   --  7.5* 6.9*   ------------------------------------------------------------------------------------------------------------------   Inpatient Medications  Scheduled Meds: . ALPRAZolam  0.25 mg Oral QHS  . antiseptic oral rinse  7 mL Mouth Rinse BID  . apixaban  2.5 mg Oral BID  . cholecalciferol  1,000 Units Oral Daily  . digoxin  0.125 mg Oral Daily  . docusate sodium  100 mg Oral  BID  . fludrocortisone  0.1 mg Oral Daily  . folic acid  1 mg Oral Daily  . hydrocortisone  20 mg Oral Daily  . hydroxychloroquine  200 mg Oral Daily  . pantoprazole  40 mg Oral Daily  . pravastatin  20 mg Oral Daily  . senna  2 tablet Oral QHS   Continuous Infusions: . sodium chloride 10 mL/hr (11/03/15 1347)   PRN Meds:.acetaminophen **OR** acetaminophen, HYDROcodone-acetaminophen, HYDROmorphone (DILAUDID) injection, magnesium citrate, menthol-cetylpyridinium **OR**  phenol, methocarbamol **OR** methocarbamol (ROBAXIN)  IV, metoCLOPramide **OR** metoCLOPramide (REGLAN) injection, ondansetron **OR** ondansetron (ZOFRAN) IV, polyethylene glycol, polyvinyl alcohol, sorbitol  Micro Results Recent Results (from the past 240 hour(s))  Aerobic/Anaerobic Culture (surgical/deep wound)     Status: None (Preliminary result)   Collection Time: 11/01/15  3:16 PM  Result Value Ref Range Status   Specimen Description SYNOVIAL LEFT HIP  Final   Special Requests FLUID ON SWAB PATIENT ON FOLLOWING ANCEF  Final   Gram Stain   Final    RARE WBC PRESENT, PREDOMINANTLY PMN NO ORGANISMS SEEN    Culture NO GROWTH 2 DAYS  Final   Report Status PENDING  Incomplete    Radiology Reports Dg Chest 2 View  11/01/2015  CLINICAL DATA:  Preoperative total hip replacement. Cough and congestion EXAM: CHEST  2 VIEW COMPARISON:  August 11, 2015 FINDINGS: There is no edema or consolidation. The heart size and pulmonary vascularity are normal. No adenopathy. There is advanced arthropathy in both shoulders. IMPRESSION: No edema or consolidation. Electronically Signed   By: Lowella Grip III M.D.   On: 11/01/2015 10:59   Dg Pelvis Portable  11/02/2015  CLINICAL DATA:  Postoperative left hip revision. EXAM: PORTABLE PELVIS 1-2 VIEWS COMPARISON:  Intraoperative fluoroscopy 11/01/2015 FINDINGS: Postoperative left total hip arthroplasty using long-stem femoral component with additional acetabular prosthesis superiorly. Plate and screw fixation of the visualize left femoral shaft with cerclage wires present. Distal portion of the surgical hardware in the left femur is not included within the field of view. Visualize components appear well seated. Previous right total hip arthroplasty using cemented components. There is lucency at the bone-cement interface in the acetabular region which may suggest loosening. No dislocation. Visualized pelvis appears intact. IMPRESSION: Postoperative changes in both  hips as described. Electronically Signed   By: Lucienne Capers M.D.   On: 11/02/2015 00:24   Dg C-arm 61-120 Min  11/01/2015  CLINICAL DATA:  Left total hip arthroplasty with internal fixation left femoral fracture. EXAM: DG C-ARM 61-120 MIN; LEFT FEMUR 2 VIEWS COMPARISON:  Left hip 05/18/2015 FINDINGS: Intraoperative fluoroscopy is obtained for surgical control purposes during orthopedic procedure. Fluoroscopy time is recorded at 2 minutes 29 seconds. Seven spot fluoroscopic images are obtained. Spot fluoroscopic images obtained of the left hip and left femur the demonstrate placement of a left total hip arthroplasty with long-stem femoral component and with additional acetabular prosthesis. Bone matrix of the femur is demineralized. Multiple cerclage wires are utilized. Lateral plate and screw fixation of the femoral shaft. Left knee arthroplasty. Visualized components appear grossly well-seated. IMPRESSION: Intraoperative fluoroscopy obtained for surgical control purposes demonstrating left hip and left knee arthroplasties with plate and screw fixation of the left femur. Electronically Signed   By: Lucienne Capers M.D.   On: 11/01/2015 21:37   Dg Femur Min 2 Views Left  11/01/2015  CLINICAL DATA:  Left total hip arthroplasty with internal fixation left femoral fracture. EXAM: DG C-ARM 61-120 MIN; LEFT FEMUR 2 VIEWS COMPARISON:  Left  hip 05/18/2015 FINDINGS: Intraoperative fluoroscopy is obtained for surgical control purposes during orthopedic procedure. Fluoroscopy time is recorded at 2 minutes 29 seconds. Seven spot fluoroscopic images are obtained. Spot fluoroscopic images obtained of the left hip and left femur the demonstrate placement of a left total hip arthroplasty with long-stem femoral component and with additional acetabular prosthesis. Bone matrix of the femur is demineralized. Multiple cerclage wires are utilized. Lateral plate and screw fixation of the femoral shaft. Left knee arthroplasty.  Visualized components appear grossly well-seated. IMPRESSION: Intraoperative fluoroscopy obtained for surgical control purposes demonstrating left hip and left knee arthroplasties with plate and screw fixation of the left femur. Electronically Signed   By: Lucienne Capers M.D.   On: 11/01/2015 21:37   Dg Femur Port Min 2 Views Left  11/02/2015  CLINICAL DATA:  Postoperative evaluation. EXAM: LEFT FEMUR PORTABLE 2 VIEWS COMPARISON: Pelvic radiograph November 01, 2015 FINDINGS: Status post LEFT hip total arthroplasty. Lateral plate and screw fixation with cerclage wires. [place] LEFT greater trochanter, stable. Status post total knee arthroplasty. Osteopenia. Surgical drain in place, subcutaneous gas and overlying skin staples. Included view of the RIGHT femur demonstrates old distal fracture. Moderate vascular calcifications. Cachexia. IMPRESSION: Status post LEFT total hip arthroplasty, LEFT femur plate and screw fixation, LEFT total knee arthroplasty. Surgical drain in place. Electronically Signed   By: Elon Alas M.D.   On: 11/02/2015 01:20    Time Spent in minutes  25 minutes   Ibrohim Simmers S M.D on 11/04/2015 at 11:11 AM  Between 7am to 7pm - Pager - [phone]  After 7pm go to [email or web address] - password [facility]  [facility] -  Office  [phone]

## 2015-11-04 NOTE — Progress Notes (Signed)
Physical Therapy Treatment Patient Details Name: John Lyons MRN: [id number] DOB: 04-30-1945 Today's Date: 11/04/2015    History of Present Illness Patient is a 70 y/o male with hx of Addison's disease, HTN, HLD, anxiety and RA presents s/p left THA revision, acetabular and femoral components and ORIF left femur.    PT Comments    Pt very motivated and is progressing with mobility but slowly 2* limitations all 4s and TWB on R LE  Follow Up Recommendations  SNF     Equipment Recommendations  None recommended by PT    Recommendations for Other Services OT consult     Precautions / Restrictions Precautions Precautions: Posterior Hip Precaution Comments: Pt recalls 3/3 THP without cues Restrictions Weight Bearing Restrictions: Yes LLE Weight Bearing: Touchdown weight bearing Other Position/Activity Restrictions: TDWB LLE    Mobility  Bed Mobility Overal bed mobility: Needs Assistance Bed Mobility: Sit to Supine       Sit to supine: Mod assist;+2 for physical assistance;+2 for safety/equipment   General bed mobility comments: Cues for sequence, use of R LE to self assist and adherence to THP.  Pt with ltd useof hands and ltd knee flex on non-op LE.  Transfers Overall transfer level: Needs assistance Equipment used: Rolling walker (2 wheeled) Transfers: Sit to/from Stand Sit to Stand: Mod assist;+2 safety/equipment;+2 physical assistance;From elevated surface Stand pivot transfers: Mod assist;+2 safety/equipment       General transfer comment: Cues for LE management, use of UEs, and TWB.  Physical assist to bring wt up and fwd and to balance in standing wtih RW.  Increased assist to bring pt up from low recliner 2* ltd flex non-operative knee    Ambulation/Gait Ambulation/Gait assistance: Min assist;Mod assist;+2 safety/equipment Ambulation Distance (Feet): 2 Feet Assistive device: Rolling walker (2 wheeled) Gait Pattern/deviations: Step-to pattern;Decreased  step length - right;Decreased step length - left;Shuffle;Trunk flexed Gait velocity: decr Gait velocity interpretation: Below normal speed for age/gender General Gait Details: Cues for sequence, posture, position from RW, increased UE WB.  Physical assist to advance L LE and for support and balance.  Pt ltd by fatigue and bed pulled up behind pt.   Stairs            Wheelchair Mobility    Modified Rankin (Stroke Patients Only)       Balance                                    Cognition Arousal/Alertness: Awake/alert Behavior During Therapy: WFL for tasks assessed/performed Overall Cognitive Status: Within Functional Limits for tasks assessed                      Exercises Total Joint Exercises Ankle Circles/Pumps: Both;Supine;15 reps;AROM Quad Sets: Both;10 reps;Supine Gluteal Sets: Both;10 reps;Supine Hip ABduction/ADduction: AAROM;Left;15 reps;Supine Straight Leg Raises: PROM;AAROM;Left;Supine;15 reps (For hip flexion  - L knee does not bend)    General Comments        Pertinent Vitals/Pain Pain Assessment: 0-10 Pain Score: 4  Pain Location: L hip and thigh Pain Descriptors / Indicators: Aching;Sore Pain Intervention(s): Limited activity within patient's tolerance;Monitored during session;Premedicated before session;Ice applied    Home Living                      Prior Function            PT Goals (current goals can now be  found in the care plan section) Acute Rehab PT Goals Patient Stated Goal: to get back to work and home ASAP PT Goal Formulation: With patient Time For Goal Achievement: 11/16/15 Potential to Achieve Goals: Fair Progress towards PT goals: Progressing toward goals    Frequency  7X/week    PT Plan Current plan remains appropriate    Co-evaluation             End of Session Equipment Utilized During Treatment: Gait belt Activity Tolerance: Patient tolerated treatment well;Patient limited by  fatigue Patient left: in bed;with call bell/phone within reach;with family/visitor present     Time: 1315-1350 PT Time Calculation (min) (ACUTE ONLY): 35 min  Charges:  $Gait Training: 8-22 mins $Therapeutic Exercise: 8-22 mins                    G Codes:      John Lyons November 27, 2015, 3:26 PM

## 2015-11-04 NOTE — Progress Notes (Signed)
   Subjective:  Patient reports pain as mild to moderate.  Denies N/V/CP/SOB.  Objective:   VITALS:   Filed Vitals:   11/03/15 1714 11/03/15 1928 11/03/15 2145 11/04/15 0700  BP: 131/45 137/51 123/50 128/57  Pulse: 110 110 98 91  Temp: 100.8 F (38.2 C) 101 F (38.3 C) 100.4 F (38 C) 99.5 F (37.5 C)  TempSrc: Oral Oral Oral Oral  Resp: 16 16 16 16   Height:      Weight:      SpO2: 95% 94% 96% 97%    ABD soft Sensation intact distally Intact pulses distally Dorsiflexion/Plantar flexion intact Incision: dressing C/D/I Compartment soft    Lab Results  Component Value Date   WBC 8.6 11/03/2015   HGB 7.3* 11/03/2015   HCT 21.5* 11/03/2015   MCV 86.0 11/03/2015   PLT 199 11/03/2015   BMET    Component Value Date/Time   NA 138 11/03/2015 0409   K 3.3* 11/03/2015 0409   CL 107 11/03/2015 0409   CO2 25 11/03/2015 0409   GLUCOSE 112* 11/03/2015 0409   BUN 14 11/03/2015 0409   CREATININE 0.78 11/03/2015 0409   CALCIUM 6.9* 11/03/2015 0409   GFRNONAA >60 11/03/2015 0409   GFRAA >60 11/03/2015 0409    Intraop culture: NGTD   Assessment/Plan: 3 Days Post-Op   Principal Problem:   Failed total hip arthroplasty ([facility]) Active Problems:   Essential hypertension   Addison's disease ([facility])   Rheumatoid arthritis ([place])   Anxiety   GERD (gastroesophageal reflux disease)   Hyperlipidemia   Hypertension   Dysrhythmia   TDWB LLE Posterior hip precautions DVT ppx: apixaban, SCDs, TEDs PT/OT Fever: Likely due to atelectasis, pulmonary toilet, monitor Appreciate hospitalist recs ABLA: Received 2 units PRBCs yesterday, a.m. labs pending Dispo: SNF placement, likely Friday   Elie Goody 11/04/2015, 7:52 AM   Rod Can, MD Cell [phone]

## 2015-11-04 NOTE — Progress Notes (Signed)
Physical Therapy Treatment Patient Details Name: John Lyons MRN: [id number] DOB: 22-Oct-1945 Today's Date: 11/04/2015    History of Present Illness Patient is a 70 y/o male with hx of Addison's disease, HTN, HLD, anxiety and RA presents s/p left THA revision, acetabular and femoral components and ORIF left femur.    PT Comments    Pt very motivated and is progressing with mobility but slowly 2* limitations all 4s and TWB on R LE  Follow Up Recommendations  SNF     Equipment Recommendations  None recommended by PT    Recommendations for Other Services OT consult     Precautions / Restrictions Precautions Precautions: Posterior Hip Precaution Comments: Pt recalls 2/3 THP.  Reveiwed all with pt and family Restrictions Weight Bearing Restrictions: Yes LLE Weight Bearing: Touchdown weight bearing    Mobility  Bed Mobility Overal bed mobility: Needs Assistance Bed Mobility: Supine to Sit     Supine to sit: Mod assist;+2 for physical assistance;+2 for safety/equipment     General bed mobility comments: Cues for sequence and adherence to THP.  Pt with ltd use of hands and ltd knee flex on non-op LE - utilized pad to assist pt to EOB.  Transfers Overall transfer level: Needs assistance Equipment used: Rolling walker (2 wheeled) Transfers: Sit to/from Stand Sit to Stand: Mod assist;+2 safety/equipment;+2 physical assistance;From elevated surface         General transfer comment: Cues for LE management, use of UEs, and TWB.  Physical assist to bring wt up and fwd and to balance in standing wtih RW.    Ambulation/Gait Ambulation/Gait assistance: Min assist;Mod assist;+2 physical assistance;+2 safety/equipment Ambulation Distance (Feet): 5 Feet Assistive device: Rolling walker (2 wheeled) Gait Pattern/deviations: Step-to pattern;Decreased step length - right;Decreased step length - left;Shuffle;Trunk flexed Gait velocity: decr Gait velocity interpretation: Below normal  speed for age/gender General Gait Details: Cues for sequence, posture, position from RW, increased UE WB.  Physical assist to advance L LE and for support and balance   Stairs            Wheelchair Mobility    Modified Rankin (Stroke Patients Only)       Balance   Sitting-balance support: Feet supported;Bilateral upper extremity supported Sitting balance-Leahy Scale: Fair     Standing balance support: Bilateral upper extremity supported Standing balance-Leahy Scale: Poor                      Cognition Arousal/Alertness: Awake/alert Behavior During Therapy: WFL for tasks assessed/performed Overall Cognitive Status: Within Functional Limits for tasks assessed                      Exercises Total Joint Exercises Ankle Circles/Pumps: Both;Supine;15 reps;AROM Quad Sets: Both;10 reps;Supine Gluteal Sets: Both;10 reps;Supine Hip ABduction/ADduction: AAROM;Left;15 reps;Supine Straight Leg Raises: PROM;AAROM;Left;Supine;15 reps (Increased assist - unable to perform HS 2* very ltd knee fle)    General Comments        Pertinent Vitals/Pain Pain Assessment: 0-10 Pain Score: 4  Pain Location: L hip and thigh Pain Descriptors / Indicators: Aching;Sore Pain Intervention(s): Limited activity within patient's tolerance;Monitored during session;Premedicated before session;Ice applied    Home Living                      Prior Function            PT Goals (current goals can now be found in the care plan section) Acute Rehab PT Goals  Patient Stated Goal: to get back to work and home ASAP PT Goal Formulation: With patient Time For Goal Achievement: 11/16/15 Potential to Achieve Goals: Fair Progress towards PT goals: Progressing toward goals    Frequency  7X/week    PT Plan Current plan remains appropriate    Co-evaluation             End of Session Equipment Utilized During Treatment: Gait belt Activity Tolerance: Patient tolerated  treatment well;Patient limited by fatigue Patient left: in chair;with call bell/phone within reach;with family/visitor present     Time: 0831-0901 PT Time Calculation (min) (ACUTE ONLY): 30 min  Charges:  $Gait Training: 8-22 mins $Therapeutic Exercise: 8-22 mins                    G Codes:      Brielle Moro Dec 02, 2015, 12:26 PM

## 2015-11-05 ENCOUNTER — Inpatient Hospital Stay (HOSPITAL_COMMUNITY): Payer: Medicare Other

## 2015-11-05 ENCOUNTER — Encounter (HOSPITAL_COMMUNITY): Payer: Self-pay | Admitting: Radiology

## 2015-11-05 DIAGNOSIS — E785 Hyperlipidemia, unspecified: Secondary | ICD-10-CM | POA: Diagnosis not present

## 2015-11-05 DIAGNOSIS — E271 Primary adrenocortical insufficiency: Secondary | ICD-10-CM | POA: Diagnosis not present

## 2015-11-05 DIAGNOSIS — M069 Rheumatoid arthritis, unspecified: Secondary | ICD-10-CM | POA: Diagnosis not present

## 2015-11-05 DIAGNOSIS — K219 Gastro-esophageal reflux disease without esophagitis: Secondary | ICD-10-CM | POA: Diagnosis not present

## 2015-11-05 DIAGNOSIS — D62 Acute posthemorrhagic anemia: Secondary | ICD-10-CM | POA: Diagnosis not present

## 2015-11-05 DIAGNOSIS — M25559 Pain in unspecified hip: Secondary | ICD-10-CM | POA: Diagnosis not present

## 2015-11-05 DIAGNOSIS — G4700 Insomnia, unspecified: Secondary | ICD-10-CM | POA: Diagnosis not present

## 2015-11-05 DIAGNOSIS — R262 Difficulty in walking, not elsewhere classified: Secondary | ICD-10-CM | POA: Diagnosis not present

## 2015-11-05 DIAGNOSIS — Z96642 Presence of left artificial hip joint: Secondary | ICD-10-CM | POA: Diagnosis not present

## 2015-11-05 DIAGNOSIS — R0602 Shortness of breath: Secondary | ICD-10-CM | POA: Diagnosis not present

## 2015-11-05 DIAGNOSIS — M6281 Muscle weakness (generalized): Secondary | ICD-10-CM | POA: Diagnosis not present

## 2015-11-05 DIAGNOSIS — K5900 Constipation, unspecified: Secondary | ICD-10-CM | POA: Diagnosis not present

## 2015-11-05 DIAGNOSIS — Z79899 Other long term (current) drug therapy: Secondary | ICD-10-CM | POA: Diagnosis not present

## 2015-11-05 DIAGNOSIS — Z471 Aftercare following joint replacement surgery: Secondary | ICD-10-CM | POA: Diagnosis not present

## 2015-11-05 DIAGNOSIS — F419 Anxiety disorder, unspecified: Secondary | ICD-10-CM | POA: Diagnosis not present

## 2015-11-05 DIAGNOSIS — R2681 Unsteadiness on feet: Secondary | ICD-10-CM | POA: Diagnosis not present

## 2015-11-05 DIAGNOSIS — T84098S Other mechanical complication of other internal joint prosthesis, sequela: Secondary | ICD-10-CM | POA: Diagnosis not present

## 2015-11-05 DIAGNOSIS — I4891 Unspecified atrial fibrillation: Secondary | ICD-10-CM | POA: Diagnosis not present

## 2015-11-05 DIAGNOSIS — I10 Essential (primary) hypertension: Secondary | ICD-10-CM | POA: Diagnosis not present

## 2015-11-05 DIAGNOSIS — R509 Fever, unspecified: Secondary | ICD-10-CM | POA: Diagnosis not present

## 2015-11-05 LAB — BASIC METABOLIC PANEL
Anion gap: 8 (ref 5–15)
BUN: 6 mg/dL (ref 6–20)
CHLORIDE: 104 mmol/L (ref 101–111)
CO2: 24 mmol/L (ref 22–32)
CREATININE: 0.42 mg/dL — AB (ref 0.61–1.24)
Calcium: 7.2 mg/dL — ABNORMAL LOW (ref 8.9–10.3)
GFR calc non Af Amer: >60 mL/min (ref >60)
Glucose, Bld: 88 mg/dL (ref 65–99)
POTASSIUM: 3.6 mmol/L (ref 3.5–5.1)
SODIUM: 136 mmol/L (ref 135–145)

## 2015-11-05 LAB — CBC
HEMATOCRIT: 25.6 % — AB (ref 39.0–52.0)
HEMOGLOBIN: 8.9 g/dL — AB (ref 13.0–17.0)
MCH: 29.9 pg (ref 26.0–34.0)
MCHC: 34.8 g/dL (ref 30.0–36.0)
MCV: 85.9 fL (ref 78.0–100.0)
Platelets: 208 10*3/uL (ref 150–400)
RBC: 2.98 MIL/uL — AB (ref 4.22–5.81)
RDW: 15 % (ref 11.5–15.5)
WBC: 10.4 10*3/uL (ref 4.0–10.5)

## 2015-11-05 LAB — GLUCOSE, CAPILLARY: GLUCOSE-CAPILLARY: 94 mg/dL (ref 65–99)

## 2015-11-05 MED ORDER — IOPAMIDOL (ISOVUE-370) INJECTION 76%
INTRAVENOUS | Status: AC
  Administered 2015-11-05: 100 mL
  Filled 2015-11-05: qty 100

## 2015-11-05 MED ORDER — DOCUSATE SODIUM 100 MG PO CAPS: 100.0000 mg | ORAL_CAPSULE | Freq: Two times a day (BID) | ORAL | Status: DC

## 2015-11-05 MED ORDER — APIXABAN 2.5 MG PO TABS: 2.5000 mg | ORAL_TABLET | Freq: Two times a day (BID) | ORAL | Status: DC

## 2015-11-05 MED ORDER — ONDANSETRON HCL 4 MG PO TABS: 4.0000 mg | ORAL_TABLET | Freq: Four times a day (QID) | ORAL | Status: DC | PRN

## 2015-11-05 MED ORDER — HYDROCODONE-ACETAMINOPHEN 5-325 MG PO TABS: 1.0000 | ORAL_TABLET | ORAL | Status: DC | PRN

## 2015-11-05 MED ORDER — SENNA 8.6 MG PO TABS: 2.0000 | ORAL_TABLET | Freq: Every day | ORAL | Status: DC

## 2015-11-05 NOTE — Progress Notes (Signed)
   Subjective:  Patient reports pain as mild to moderate.  Denies N/V/CP/SOB.  Objective:   VITALS:   Filed Vitals:   11/04/15 2146 11/05/15 0011 11/05/15 0351 11/05/15 0352  BP: 136/51 118/76 121/44 114/45  Pulse: 95 103 97   Temp: 100.8 F (38.2 C) 98.3 F (36.8 C) 99.5 F (37.5 C)   TempSrc: Oral Oral Oral   Resp: 16 18 19    Height:      Weight:      SpO2: 99% 99% 98%     ABD soft Sensation intact distally Intact pulses distally Dorsiflexion/Plantar flexion intact Incision: dressing C/D/I Compartment soft    Lab Results  Component Value Date   WBC 10.4 11/05/2015   HGB 8.9* 11/05/2015   HCT 25.6* 11/05/2015   MCV 85.9 11/05/2015   PLT 208 11/05/2015   BMET    Component Value Date/Time   NA 136 11/05/2015 0321   K 3.6 11/05/2015 0321   CL 104 11/05/2015 0321   CO2 24 11/05/2015 0321   GLUCOSE 88 11/05/2015 0321   BUN 6 11/05/2015 0321   CREATININE 0.42* 11/05/2015 0321   CALCIUM 7.2* 11/05/2015 0321   GFRNONAA >60 11/05/2015 0321   GFRAA >60 11/05/2015 0321    Intraop culture: NGTD   Assessment/Plan: 4 Days Post-Op   Principal Problem:   Failed total hip arthroplasty (HCC) Active Problems:   Essential hypertension   Addison's disease ([facility])   Rheumatoid arthritis ([facility])   Anxiety   GERD (gastroesophageal reflux disease)   Hyperlipidemia   Hypertension   Dysrhythmia   TDWB LLE Posterior hip precautions DVT ppx: apixaban, SCDs, TEDs PT/OT Fever: will order CT chest to rule out PE, pulmonary toilet Appreciate hospitalist recs ABLA: s/p 2 units PRBCs with appropriate response Dispo: SNF placement, today vs tomorrow depending on CT results   Davey Bergsma, Horald Pollen 11/05/2015, 7:40 AM   Rod Can, MD Cell [phone]

## 2015-11-05 NOTE — Progress Notes (Signed)
Report given to [facility].

## 2015-11-05 NOTE — Care Management Important Message (Signed)
Important Message  Patient Details  Name: John Lyons MRN: [id number] Date of Birth: Aug 28, 1945   Medicare Important Message Given:  Yes    Nathen May 11/05/2015, 11:41 AM

## 2015-11-05 NOTE — Clinical Social Work Placement (Signed)
   CLINICAL SOCIAL WORK PLACEMENT  NOTE  Date:  11/05/2015  Patient Details  Name: John Lyons MRN: [id number] Date of Birth: [birth year]  Clinical Social Work is seeking post-discharge placement for this patient at the [place] level of care (*CSW will initial, date and re-position this form in  chart as items are completed):  Yes   Patient/family provided with [place] Work Department's list of facilities offering this level of care within the geographic area requested by the patient (or if unable, by the patient's family).  Yes   Patient/family informed of their freedom to choose among providers that offer the needed level of care, that participate in Medicare, Medicaid or managed care program needed by the patient, have an available bed and are willing to accept the patient.  Yes   Patient/family informed of [place]'s ownership interest in [facility] and [facility], as well as of the fact that they are under no obligation to receive care at these facilities.  PASRR submitted to EDS on 11/02/15     PASRR number received on 11/02/15     Existing PASRR number confirmed on       FL2 transmitted to all facilities in geographic area requested by pt/family on 11/02/15     FL2 transmitted to all facilities within larger geographic area on       Patient informed that his/her managed care company has contracts with or will negotiate with certain facilities, including the following:        Yes   Patient/family informed of bed offers received.  Patient chooses bed at [facility]     Physician recommends and patient chooses bed at      Patient to be transferred to [facility] on 11/05/15.  Patient to be transferred to facility by Ambulance     Patient family notified on 11/05/15 of transfer.  Name of family member notified:  Callahan Mondry at bedside     PHYSICIAN Please sign FL2, Please prepare priority discharge summary, including  medications, Please prepare prescriptions     Additional Comment:  Per MD patient ready for DC to [facility]. RN, patient, patient's family, and facility notified of DC. RN given number for report. DC packet on chart. Ambulance transport requested for patient, they state it will be approximately 1 - 1.5hrs before they pick the patient up. CSW signing off.   _______________________________________________ Rigoberto Noel, LCSW 11/05/2015, 3:25 PM

## 2015-11-06 LAB — AEROBIC/ANAEROBIC CULTURE W GRAM STAIN (SURGICAL/DEEP WOUND): Culture: NO GROWTH

## 2015-11-06 LAB — AEROBIC/ANAEROBIC CULTURE (SURGICAL/DEEP WOUND)

## 2015-11-08 ENCOUNTER — Other Ambulatory Visit: Payer: Self-pay

## 2015-11-08 ENCOUNTER — Encounter: Payer: Self-pay | Admitting: Adult Health

## 2015-11-08 ENCOUNTER — Non-Acute Institutional Stay (SKILLED_NURSING_FACILITY): Payer: Medicare Other | Admitting: Adult Health

## 2015-11-08 DIAGNOSIS — T84098S Other mechanical complication of other internal joint prosthesis, sequela: Secondary | ICD-10-CM | POA: Diagnosis not present

## 2015-11-08 DIAGNOSIS — E785 Hyperlipidemia, unspecified: Secondary | ICD-10-CM | POA: Diagnosis not present

## 2015-11-08 DIAGNOSIS — R2681 Unsteadiness on feet: Secondary | ICD-10-CM | POA: Diagnosis not present

## 2015-11-08 DIAGNOSIS — M069 Rheumatoid arthritis, unspecified: Secondary | ICD-10-CM | POA: Diagnosis not present

## 2015-11-08 DIAGNOSIS — K5900 Constipation, unspecified: Secondary | ICD-10-CM | POA: Diagnosis not present

## 2015-11-08 DIAGNOSIS — F419 Anxiety disorder, unspecified: Secondary | ICD-10-CM

## 2015-11-08 DIAGNOSIS — E271 Primary adrenocortical insufficiency: Secondary | ICD-10-CM

## 2015-11-08 DIAGNOSIS — K219 Gastro-esophageal reflux disease without esophagitis: Secondary | ICD-10-CM | POA: Diagnosis not present

## 2015-11-08 DIAGNOSIS — I4891 Unspecified atrial fibrillation: Secondary | ICD-10-CM

## 2015-11-08 DIAGNOSIS — I10 Essential (primary) hypertension: Secondary | ICD-10-CM

## 2015-11-08 DIAGNOSIS — T84018S Broken internal joint prosthesis, other site, sequela: Secondary | ICD-10-CM

## 2015-11-08 DIAGNOSIS — K5909 Other constipation: Secondary | ICD-10-CM

## 2015-11-08 DIAGNOSIS — Z96649 Presence of unspecified artificial hip joint: Secondary | ICD-10-CM

## 2015-11-08 MED ORDER — DIAZEPAM 5 MG PO TABS: 2.5000 mg | ORAL_TABLET | Freq: Four times a day (QID) | ORAL | Status: DC | PRN

## 2015-11-08 MED ORDER — HYDROCODONE-ACETAMINOPHEN 5-325 MG PO TABS: 1.0000 | ORAL_TABLET | ORAL | Status: DC | PRN

## 2015-11-08 NOTE — Telephone Encounter (Signed)
Prescription request was received from:  [facility] [address] [place] [address]  Phone: [phone]  Fax: [phone]  

## 2015-11-08 NOTE — Progress Notes (Signed)
Patient ID: Reik Ingerson, male   DOB: 12-31-1945, 70 y.o.   MRN: [id number]    DATE:  11/08/2015   MRN:  [id number]  BIRTHDAY: May 25, 1945  Facility:  Nursing Home Location:  [place] Room Number: [id number]  LEVEL OF CARE:  SNF [phone])  Contact Information    Name Relation Home Work [place] Spouse [phone]  [phone]   Rolley Sims [phone]         Code Status History    Date Active Date Inactive Code Status Order ID Comments User Context   11/02/2015 12:24 AM 11/05/2015  9:51 PM Full Code [id number]  Rod Can, MD Inpatient   08/11/2015 10:52 AM 08/13/2015  9:36 PM Full Code [id number]  Dionne Milo, NP ED       Chief Complaint  Patient presents with  . Hospitalization Follow-up     HISTORY OF PRESENT ILLNESS:  This is a 70 year old male who has been admitted to [facility] on 11/05/15 from [facility] with failed total hip arthroplasty for which he had revision of left total hip arthroplasty on 11/01/15 - 11/02/15. He has been admitted for a short-term rehabilitation.   PAST MEDICAL HISTORY:  Past Medical History  Diagnosis Date  . Addison's disease ([place])     takes Florinef and Cortef daily  . PONV (postoperative nausea and vomiting)   . H/O hiatal hernia   . Constipation     takes Linzess daily and Miralax as needed as well as Colace  . Dysrhythmia     takes Digoxin daily  . Anxiety     takes Xanax at bedtime  . GERD (gastroesophageal reflux disease)     takes Pantoprazole daily  . Hyperlipidemia     takes Pravastatin daily  . Hypertension     takes Amlodipine and Micardis daily  . Weakness     right hand.Knot on right hand,states came up 2 wks ago,=  . Joint pain   . Joint swelling   . Rheumatoid arthritis(714.0)     Rheumatoid-takes Reclast yearly and Plaquenil daily  . Dry skin     on legs  . History of colon polyps     benign  . Diverticulosis   . History of blood transfusion       CURRENT MEDICATIONS: Reviewed  Patient's Medications  New Prescriptions   No medications on file  Previous Medications   ALPRAZOLAM (XANAX) 0.25 MG TABLET    Take 0.25 mg by mouth at bedtime.   APIXABAN (ELIQUIS) 2.5 MG TABS TABLET    Take 1 tablet (2.5 mg total) by mouth 2 (two) times daily.   ATORVASTATIN (LIPITOR) 10 MG TABLET    Take 10 mg by mouth daily.   CHOLECALCIFEROL (VITAMIN D3) 1000 UNITS CAPS    Take 1,000 Units by mouth daily.    DIAZEPAM (VALIUM) 5 MG TABLET    Take 0.5 tablets (2.5 mg total) by mouth every 6 (six) hours as needed for muscle spasms.   DIGOXIN (LANOXIN) 0.125 MG TABLET    Take 0.125 mg by mouth daily.   DOCUSATE SODIUM (COLACE) 100 MG CAPSULE    Take 1 capsule (100 mg total) by mouth 2 (two) times daily.   FLUDROCORTISONE (FLORINEF) 0.1 MG TABLET    Take 0.1 mg by mouth daily.    FOLIC ACID (FOLVITE) A999333 MCG TABLET    Take 400 mcg by mouth daily.   HYDROCODONE-ACETAMINOPHEN (NORCO) 5-325 MG TABLET  Take 1-2 tablets by mouth every 4 (four) hours as needed for moderate pain.   HYDROCORTISONE (CORTEF) 20 MG TABLET    Take 20 mg by mouth daily.    HYDROXYCHLOROQUINE (PLAQUENIL) 200 MG TABLET    Take 200 mg by mouth daily.   LINACLOTIDE (LINZESS) 145 MCG CAPS CAPSULE    Take 145 mcg by mouth daily as needed (constipation).   MULTIPLE VITAMIN (MULTIVITAMIN WITH MINERALS) TABS    Take 1 tablet by mouth daily.   ONDANSETRON (ZOFRAN) 4 MG TABLET    Take 1 tablet (4 mg total) by mouth every 6 (six) hours as needed for nausea.   PANTOPRAZOLE (PROTONIX) 40 MG TABLET    Take 40 mg by mouth daily.   POLYETHYLENE GLYCOL (MIRALAX / GLYCOLAX) PACKET    Take 17 g by mouth daily as needed for mild constipation.   PROPYLENE GLYCOL-GLYCERIN (SOOTHE) 0.6-0.6 % SOLN    Place 1 drop into both eyes daily as needed (dryness).   SENNA (SENOKOT) 8.6 MG TABS TABLET    Take 2 tablets (17.2 mg total) by mouth at bedtime.  Modified Medications   No medications on file  Discontinued  Medications   PRAVASTATIN (PRAVACHOL) 20 MG TABLET    Take 20 mg by mouth daily.     Allergies  Allergen Reactions  . Adhesive [Tape] Rash  . Phenobarbital Rash     REVIEW OF SYSTEMS:  GENERAL: no change in appetite, no fatigue, no weight changes, no fever, chills or weakness EYES: Denies change in vision, dry eyes, eye pain, itching or discharge EARS: Denies change in hearing, ringing in ears, or earache NOSE: Denies nasal congestion or epistaxis MOUTH and THROAT: Denies oral discomfort, gingival pain or bleeding, pain from teeth or hoarseness   RESPIRATORY: no cough, SOB, DOE, wheezing, hemoptysis CARDIAC: no chest pain, or palpitations GI: no abdominal pain, diarrhea, constipation, heart burn, nausea or vomiting GU: Denies dysuria, frequency, hematuria, incontinence, or discharge PSYCHIATRIC: Denies feeling of depression or anxiety. No report of hallucinations, insomnia, paranoia, or agitation   PHYSICAL EXAMINATION  GENERAL APPEARANCE: Well nourished. In no acute distress. Normal body habitus SKIN:  Left lateral hip down to the thigh surgical incision has 96 staples with slight yellowish drainage, no erythema HEAD: Normal in size and contour. No evidence of trauma EYES: Lids open and close normally. No blepharitis, entropion or ectropion. PERRL. Conjunctivae are clear and sclerae are white. Lenses are without opacity EARS: Pinnae are normal. Patient hears normal voice tunes of the examiner MOUTH and THROAT: Lips are without lesions. Oral mucosa is moist and without lesions. Tongue is normal in shape, size, and color and without lesions NECK: supple, trachea midline, no neck masses, no thyroid tenderness, no thyromegaly LYMPHATICS: no LAN in the neck, no supraclavicular LAN RESPIRATORY: breathing is even & unlabored, BS CTAB CARDIAC: RRR, no murmur,no extra heart sounds, LLE edema 1+ GI: abdomen soft, normal BS, no masses, no tenderness, no hepatomegaly, no  splenomegaly EXTREMITIES:  Able to move X 4 extremities with limited ROM on LLE due to surgery PSYCHIATRIC: Alert and oriented X 3. Affect and behavior are appropriate  LABS/RADIOLOGY: Labs reviewed: Basic Metabolic Panel:  Recent Labs  08/11/15 1144  11/02/15 0213 11/03/15 0409 11/05/15 0321  NA  --   < > 141 138 136  K  --   < > 3.4* 3.3* 3.6  CL  --   < > 106 107 104  CO2  --   < > 24 25  24  GLUCOSE  --   < > 182* 112* 88  BUN  --   < > 8 14 6   CREATININE  --   < > 0.63  0.66 0.78 0.42*  CALCIUM  --   < > 7.5* 6.9* 7.2*  MG 1.7  --   --   --   --   < > = values in this interval not displayed. Liver Function Tests:  Recent Labs  08/09/15 1534 08/11/15 0516 08/12/15 0345  AST 23 18 18   ALT 22 14* 12*  ALKPHOS 65 54 56  BILITOT 0.9 1.5* 0.7  PROT 6.9 6.1* 5.4*  ALBUMIN 3.9 2.9* 2.4*    Recent Labs  08/11/15 0516  LIPASE 22   CBC:  Recent Labs  08/09/15 1951 08/10/15 1219  11/03/15 0409 11/04/15 0643 11/05/15 0321  WBC  --  12.3*  < > 8.6 9.8 10.4  NEUTROABS 8.2* 10.7*  --   --   --   --   HGB  --  13.3  < > 7.3* 9.6* 8.9*  HCT  --  38.5*  < > 21.5* 27.4* 25.6*  MCV  --  87.3  < > 86.0 85.9 85.9  PLT  --  316  < > 199 159 208  < > = values in this interval not displayed.  CBG:  Recent Labs  08/12/15 0830 08/13/15 0746 11/05/15 0632  GLUCAP 82 133* 94      Dg Chest 2 View  11/01/2015  CLINICAL DATA:  Preoperative total hip replacement. Cough and congestion EXAM: CHEST  2 VIEW COMPARISON:  August 11, 2015 FINDINGS: There is no edema or consolidation. The heart size and pulmonary vascularity are normal. No adenopathy. There is advanced arthropathy in both shoulders. IMPRESSION: No edema or consolidation. Electronically Signed   By: Lowella Grip III M.D.   On: 11/01/2015 10:59   Ct Angio Chest Pe W Or Wo Contrast  11/05/2015  CLINICAL DATA:  Shortness of Breath, history of hip replacement and femur fixation EXAM: CT ANGIOGRAPHY CHEST WITH  CONTRAST TECHNIQUE: Multidetector CT imaging of the chest was performed using the standard protocol during bolus administration of intravenous contrast. Multiplanar CT image reconstructions and MIPs were obtained to evaluate the vascular anatomy. CONTRAST:  100 mL Isovue 370. COMPARISON:  None. FINDINGS: Mediastinum/Lymph Nodes: No significant lymphadenopathy is noted. The thoracic inlet is unremarkable. Cardiovascular: The thoracic aorta is well visualized and demonstrates no evidence of aneurysmal dilatation or dissection. Mild coronary calcifications are seen. The pulmonary artery is well visualized without evidence of filling defect to suggest pulmonary embolus. Cardiac structures are otherwise within normal limits. Lungs/Pleura: Small pleural effusions are noted bilaterally. No focal infiltrate or sizable pneumothorax is seen. No parenchymal nodules are noted. Upper abdomen: No acute findings. Musculoskeletal: Mild degenerative change of the thoracic spine is noted. Review of the MIP images confirms the above findings. IMPRESSION: Small pleural effusions bilaterally. No evidence of pulmonary emboli. No other focal abnormality is seen. Electronically Signed   By: Inez Catalina M.D.   On: 11/05/2015 09:45   Dg Pelvis Portable  11/02/2015  CLINICAL DATA:  Postoperative left hip revision. EXAM: PORTABLE PELVIS 1-2 VIEWS COMPARISON:  Intraoperative fluoroscopy 11/01/2015 FINDINGS: Postoperative left total hip arthroplasty using long-stem femoral component with additional acetabular prosthesis superiorly. Plate and screw fixation of the visualize left femoral shaft with cerclage wires present. Distal portion of the surgical hardware in the left femur is not included within the field of view. Visualize  components appear well seated. Previous right total hip arthroplasty using cemented components. There is lucency at the bone-cement interface in the acetabular region which may suggest loosening. No dislocation.  Visualized pelvis appears intact. IMPRESSION: Postoperative changes in both hips as described. Electronically Signed   By: Lucienne Capers M.D.   On: 11/02/2015 00:24   Dg C-arm 61-120 Min  11/01/2015  CLINICAL DATA:  Left total hip arthroplasty with internal fixation left femoral fracture. EXAM: DG C-ARM 61-120 MIN; LEFT FEMUR 2 VIEWS COMPARISON:  Left hip 05/18/2015 FINDINGS: Intraoperative fluoroscopy is obtained for surgical control purposes during orthopedic procedure. Fluoroscopy time is recorded at 2 minutes 29 seconds. Seven spot fluoroscopic images are obtained. Spot fluoroscopic images obtained of the left hip and left femur the demonstrate placement of a left total hip arthroplasty with long-stem femoral component and with additional acetabular prosthesis. Bone matrix of the femur is demineralized. Multiple cerclage wires are utilized. Lateral plate and screw fixation of the femoral shaft. Left knee arthroplasty. Visualized components appear grossly well-seated. IMPRESSION: Intraoperative fluoroscopy obtained for surgical control purposes demonstrating left hip and left knee arthroplasties with plate and screw fixation of the left femur. Electronically Signed   By: Lucienne Capers M.D.   On: 11/01/2015 21:37   Dg Femur Min 2 Views Left  11/01/2015  CLINICAL DATA:  Left total hip arthroplasty with internal fixation left femoral fracture. EXAM: DG C-ARM 61-120 MIN; LEFT FEMUR 2 VIEWS COMPARISON:  Left hip 05/18/2015 FINDINGS: Intraoperative fluoroscopy is obtained for surgical control purposes during orthopedic procedure. Fluoroscopy time is recorded at 2 minutes 29 seconds. Seven spot fluoroscopic images are obtained. Spot fluoroscopic images obtained of the left hip and left femur the demonstrate placement of a left total hip arthroplasty with long-stem femoral component and with additional acetabular prosthesis. Bone matrix of the femur is demineralized. Multiple cerclage wires are utilized.  Lateral plate and screw fixation of the femoral shaft. Left knee arthroplasty. Visualized components appear grossly well-seated. IMPRESSION: Intraoperative fluoroscopy obtained for surgical control purposes demonstrating left hip and left knee arthroplasties with plate and screw fixation of the left femur. Electronically Signed   By: Lucienne Capers M.D.   On: 11/01/2015 21:37   Dg Femur Port Min 2 Views Left  11/02/2015  CLINICAL DATA:  Postoperative evaluation. EXAM: LEFT FEMUR PORTABLE 2 VIEWS COMPARISON: Pelvic radiograph November 01, 2015 FINDINGS: Status post LEFT hip total arthroplasty. Lateral plate and screw fixation with cerclage wires. [place] LEFT greater trochanter, stable. Status post total knee arthroplasty. Osteopenia. Surgical drain in place, subcutaneous gas and overlying skin staples. Included view of the RIGHT femur demonstrates old distal fracture. Moderate vascular calcifications. Cachexia. IMPRESSION: Status post LEFT total hip arthroplasty, LEFT femur plate and screw fixation, LEFT total knee arthroplasty. Surgical drain in place. Electronically Signed   By: Elon Alas M.D.   On: 11/02/2015 01:20    ASSESSMENT/PLAN:  Unsteady gait - for rehabilitation, PT and OT  Failed total hip arthroplasty S/P revision of left total hip arthroplasty - for rehabilitation, PT and OT; continue Norco 5/325 mg 1-2 tabs by mouth every 4 hours when necessary for pain; Valium 5 mg take 1/2 tab = 2.5 mg every 6 hours when necessary for muscle spasm; Eliquis 2.5 mg 1 tab by mouth twice a day for DVT prophylaxis; follow-up with orthopedic surgeon, Dr. Lyla Glassing  Anxiety - mood is stable; continue Xanax 0.25 mg 1 tab by mouth daily at bedtime  Atrial Fibrillation - continue digoxin 0.125 mg 1 tab  by mouth daily  Chronic constipation - continue Colace 100 mg 1 capsule by mouth twice a day, Linzess 145 g 1 capsule by mouth daily when necessary, MiraLAX 17 g by mouth daily when necessary and senna 8.6  mg take 2 tabs by mouth daily at bedtime  Addison's disease - continue Florinef 0.1 mg 1 tab by mouth daily, Cortef 20 mg 1 tab by mouth daily; check CMP  Rheumatoid arthritis - Plaquenil 200 mg 1 tab by mouth daily  GERD - continue Protonix 40 mg 1 tab by mouth daily  Hyperlipidemia - continue pravastatin 20 mg 1 tab by mouth daily; check lipid panel  Hypertension - Micardis 80 mg and amlodipine 2.5 mg were discontinued; BP/HR twice a day 1 week  Anemia, acute blood loss - S/P transfusion of 2 units PRBC; check CBC Lab Results  Component Value Date   HGB 8.9* 11/05/2015       Goals of care:  Short-term rehabilitation     Durenda Age, NP [facility] [phone]

## 2015-11-08 NOTE — Telephone Encounter (Signed)
Prescription request was received from:  [facility] [address] [place] [address]  Phone: [phone]  Fax: [phone]  

## 2015-11-09 ENCOUNTER — Encounter: Payer: Self-pay | Admitting: Internal Medicine

## 2015-11-09 ENCOUNTER — Non-Acute Institutional Stay (SKILLED_NURSING_FACILITY): Payer: Medicare Other | Admitting: Internal Medicine

## 2015-11-09 DIAGNOSIS — I10 Essential (primary) hypertension: Secondary | ICD-10-CM | POA: Diagnosis not present

## 2015-11-09 DIAGNOSIS — G4700 Insomnia, unspecified: Secondary | ICD-10-CM

## 2015-11-09 DIAGNOSIS — T84018S Broken internal joint prosthesis, other site, sequela: Secondary | ICD-10-CM

## 2015-11-09 DIAGNOSIS — F419 Anxiety disorder, unspecified: Secondary | ICD-10-CM

## 2015-11-09 DIAGNOSIS — R2681 Unsteadiness on feet: Secondary | ICD-10-CM | POA: Diagnosis not present

## 2015-11-09 DIAGNOSIS — K5900 Constipation, unspecified: Secondary | ICD-10-CM | POA: Diagnosis not present

## 2015-11-09 DIAGNOSIS — D62 Acute posthemorrhagic anemia: Secondary | ICD-10-CM

## 2015-11-09 DIAGNOSIS — E271 Primary adrenocortical insufficiency: Secondary | ICD-10-CM | POA: Diagnosis not present

## 2015-11-09 DIAGNOSIS — Z96649 Presence of unspecified artificial hip joint: Secondary | ICD-10-CM

## 2015-11-09 DIAGNOSIS — K219 Gastro-esophageal reflux disease without esophagitis: Secondary | ICD-10-CM | POA: Diagnosis not present

## 2015-11-09 DIAGNOSIS — T84098S Other mechanical complication of other internal joint prosthesis, sequela: Secondary | ICD-10-CM | POA: Diagnosis not present

## 2015-11-09 DIAGNOSIS — I4891 Unspecified atrial fibrillation: Secondary | ICD-10-CM

## 2015-11-09 NOTE — Progress Notes (Signed)
LOCATION: [facility]  PCP: [name], MD   Code Status: Full Code  Goals of care: Advanced Directive information Advanced Directives 11/02/2015  Does patient have an advance directive? Yes  Type of Paramedic of [place];Living will  Does patient want to make changes to advanced directive? No - Patient declined  Copy of advanced directive(s) in chart? No - copy requested  Would patient like information on creating an advanced directive? -       Extended Emergency Contact Information Primary Emergency Contact: Remmert,Judy Address: [address], [place] [address] Johnnette Litter of [place] Phone: [phone] Mobile Phone: [phone] Relation: Spouse Secondary Emergency Contact: Deforest Hoyles [place] of [place] Phone: [phone] Mobile Phone: [phone] Relation: Sister   Allergies  Allergen Reactions  . Adhesive [Tape] Rash  . Phenobarbital Rash    Chief Complaint  Patient presents with  . New Admit To SNF    New Admission     HPI:  Patient is a 70 y.o. male seen today for short term rehabilitation post hospital admission from 11/01/15-11/05/15 with failed left hip arthroplasty. he underwent revision of left hip arthroplasty. he had ABLA and required blood transfusion. he is seen in her room today.   Review of Systems:  Constitutional: Negative for fever, chills. Energy level is slowly coming back.  HENT: Negative for headache, congestion, nasal discharge Eyes: Negative for blurred vision  Respiratory: Negative for cough, shortness of breath and wheezing.   Cardiovascular: Negative for chest pain, palpitations, leg swelling.  Gastrointestinal: Negative for heartburn, nausea, vomiting, abdominal pain. Last bowel movement was this morning.  Genitourinary: Negative for dysuria and flank pain.  Musculoskeletal: Negative for back pain, fall in the facility.  Skin: Negative for itching, rash.    Neurological: Positive for dizziness with change of position. Psychiatric/Behavioral: Negative for depression   Past Medical History  Diagnosis Date  . Addison's disease ([place])     takes Florinef and Cortef daily  . PONV (postoperative nausea and vomiting)   . H/O hiatal hernia   . Constipation     takes Linzess daily and Miralax as needed as well as Colace  . Dysrhythmia     takes Digoxin daily  . Anxiety     takes Xanax at bedtime  . GERD (gastroesophageal reflux disease)     takes Pantoprazole daily  . Hyperlipidemia     takes Pravastatin daily  . Hypertension     takes Amlodipine and Micardis daily  . Weakness     right hand.Knot on right hand,states came up 2 wks ago,=  . Joint pain   . Joint swelling   . Rheumatoid arthritis(714.0)     Rheumatoid-takes Reclast yearly and Plaquenil daily  . Dry skin     on legs  . History of colon polyps     benign  . Diverticulosis   . History of blood transfusion    Past Surgical History  Procedure Laterality Date  . Total knee arthroplasty  1984    Left   . Total hip arthroplasty  1978/1979    bilat  . Eye surgery      cataracts  . Cardiovascular stress test  01/04/12    Dr. Gwenlyn Found - [facility]  . Total knee arthroplasty  02/12/2012    right  . Total knee arthroplasty  02/12/2012    Procedure: TOTAL KNEE ARTHROPLASTY;  Surgeon: Rudean Haskell,  MD;  Location: [facility];  Service: Orthopedics;  Laterality: Right;  . Vasectomy  1979  . Cholecystectomy N/A 08/12/2015    Procedure: LAPAROSCOPIC CHOLECYSTECTOMY;  Surgeon: Mickeal Skinner, MD;  Location: [facility];  Service: General;  Laterality: N/A;  . Colonoscopy with esophagogastroduodenoscopy (egd) and esophageal dilation (ed)    . Colonoscopy    . Total hip arthroplasty  11/01/2015    REVISION OF LEFT TOTAL HIP ARTHROPLASTY WITH BOTH ACETABULAR AND FEMORAL COMPONENTS POSTERIOR APPROACH (Left) - Requesting RNFA  . Total hip revision Left 11/01/2015    Procedure:  REVISION OF LEFT TOTAL HIP ARTHROPLASTY WITH BOTH ACETABULAR AND FEMORAL COMPONENTS POSTERIOR APPROACH;  Surgeon: Rod Can, MD;  Location: [facility];  Service: Orthopedics;  Laterality: Left;  Requesting RNFA   Social History:   reports that he has never smoked. He has never used smokeless tobacco. He reports that he does not drink alcohol or use illicit drugs.  Family History  Problem Relation Age of Onset  . Diabetes Mother   . Heart disease Mother   . Testicular cancer Father   . Rheumatic fever Brother     Medications:   Medication List       This list is accurate as of: 11/09/15  2:58 PM.  Always use your most recent med list.               ALPRAZolam 0.25 MG tablet  Commonly known as:  XANAX  Take 0.25 mg by mouth at bedtime.     apixaban 2.5 MG Tabs tablet  Commonly known as:  ELIQUIS  Take 1 tablet (2.5 mg total) by mouth 2 (two) times daily.     atorvastatin 10 MG tablet  Commonly known as:  LIPITOR  Take 10 mg by mouth daily.     diazepam 5 MG tablet  Commonly known as:  VALIUM  Take 0.5 tablets (2.5 mg total) by mouth every 6 (six) hours as needed for muscle spasms.     digoxin 0.125 MG tablet  Commonly known as:  LANOXIN  Take 0.125 mg by mouth daily.     docusate sodium 100 MG capsule  Commonly known as:  COLACE  Take 1 capsule (100 mg total) by mouth 2 (two) times daily.     fludrocortisone 0.1 MG tablet  Commonly known as:  FLORINEF  Take 0.1 mg by mouth daily.     folic acid A999333 MCG tablet  Commonly known as:  FOLVITE  Take 400 mcg by mouth daily.     HYDROcodone-acetaminophen 5-325 MG tablet  Commonly known as:  NORCO  Take 1-2 tablets by mouth every 4 (four) hours as needed for moderate pain.     hydrocortisone 20 MG tablet  Commonly known as:  CORTEF  Take 20 mg by mouth daily.     hydroxychloroquine 200 MG tablet  Commonly known as:  PLAQUENIL  Take 200 mg by mouth daily.     LINZESS 145 MCG Caps capsule  Generic drug:   linaclotide  Take 145 mcg by mouth daily as needed (constipation).     multivitamin with minerals Tabs tablet  Take 1 tablet by mouth daily.     ondansetron 4 MG tablet  Commonly known as:  ZOFRAN  Take 1 tablet (4 mg total) by mouth every 6 (six) hours as needed for nausea.     pantoprazole 40 MG tablet  Commonly known as:  PROTONIX  Take 40 mg by mouth daily.     polyethylene  glycol packet  Commonly known as:  MIRALAX / GLYCOLAX  Take 17 g by mouth daily as needed for mild constipation.     senna 8.6 MG Tabs tablet  Commonly known as:  SENOKOT  Take 2 tablets (17.2 mg total) by mouth at bedtime.     SOOTHE 0.6-0.6 % Soln  Generic drug:  Propylene Glycol-Glycerin  Place 1 drop into both eyes daily as needed (dryness).     Vitamin D3 1000 units Caps  Take 1,000 Units by mouth daily.        Immunizations:  There is no immunization history on file for this patient.   Physical Exam:  Filed Vitals:   11/09/15 1454  BP: 110/58  Pulse: 70  Temp: 97.9 F (36.6 C)  TempSrc: Oral  Resp: 18  Height: 5\' 3"  (1.6 m)  Weight: 130 lb (58.968 kg)   Body mass index is 23.03 kg/(m^2).  General- elderly male, well built, in no acute distress Head- normocephalic, atraumatic Nose- no nasal discharge Throat- moist mucus membrane Eyes- PERRLA, EOMI, no pallor, no icterus, no discharge Neck- no cervical lymphadenopathy Cardiovascular- normal s1,s2, no murmur Respiratory- bilateral clear to auscultation, no wheeze, no rhonchi, no crackles, no use of accessory muscles Abdomen- bowel sounds present, soft, non tender Musculoskeletal- able to move all 4 extremities, limited left hip range of motion, trace right and 1 + left leg edema Neurological- alert and oriented to person, place and time Skin- warm and dry, surgical incision with staples in place, minimal drainage to left hip incision, bruise to left leg Psychiatry- normal mood and affect    Labs reviewed: Basic Metabolic  Panel:  Recent Labs  08/11/15 1144  11/02/15 0213 11/03/15 0409 11/05/15 0321  NA  --   < > 141 138 136  K  --   < > 3.4* 3.3* 3.6  CL  --   < > 106 107 104  CO2  --   < > 24 25 24   GLUCOSE  --   < > 182* 112* 88  BUN  --   < > 8 14 6   CREATININE  --   < > 0.63  0.66 0.78 0.42*  CALCIUM  --   < > 7.5* 6.9* 7.2*  MG 1.7  --   --   --   --   < > = values in this interval not displayed. Liver Function Tests:  Recent Labs  08/09/15 1534 08/11/15 0516 08/12/15 0345  AST 23 18 18   ALT 22 14* 12*  ALKPHOS 65 54 56  BILITOT 0.9 1.5* 0.7  PROT 6.9 6.1* 5.4*  ALBUMIN 3.9 2.9* 2.4*    Recent Labs  08/11/15 0516  LIPASE 22   No results for input(s): AMMONIA in the last 8760 hours. CBC:  Recent Labs  08/09/15 1951 08/10/15 1219  11/03/15 0409 11/04/15 0643 11/05/15 0321  WBC  --  12.3*  < > 8.6 9.8 10.4  NEUTROABS 8.2* 10.7*  --   --   --   --   HGB  --  13.3  < > 7.3* 9.6* 8.9*  HCT  --  38.5*  < > 21.5* 27.4* 25.6*  MCV  --  87.3  < > 86.0 85.9 85.9  PLT  --  316  < > 199 159 208  < > = values in this interval not displayed. Cardiac Enzymes: No results for input(s): CKTOTAL, CKMB, CKMBINDEX, TROPONINI in the last 8760 hours. BNP: Invalid input(s): POCBNP CBG:  Recent Labs  08/12/15 0830 08/13/15 0746 11/05/15 0632  GLUCAP 82 133* 94    Radiological Exams: Dg Chest 2 View  11/01/2015  CLINICAL DATA:  Preoperative total hip replacement. Cough and congestion EXAM: CHEST  2 VIEW COMPARISON:  August 11, 2015 FINDINGS: There is no edema or consolidation. The heart size and pulmonary vascularity are normal. No adenopathy. There is advanced arthropathy in both shoulders. IMPRESSION: No edema or consolidation. Electronically Signed   By: Lowella Grip III M.D.   On: 11/01/2015 10:59   Ct Angio Chest Pe W Or Wo Contrast  11/05/2015  CLINICAL DATA:  Shortness of Breath, history of hip replacement and femur fixation EXAM: CT ANGIOGRAPHY CHEST WITH CONTRAST  TECHNIQUE: Multidetector CT imaging of the chest was performed using the standard protocol during bolus administration of intravenous contrast. Multiplanar CT image reconstructions and MIPs were obtained to evaluate the vascular anatomy. CONTRAST:  100 mL Isovue 370. COMPARISON:  None. FINDINGS: Mediastinum/Lymph Nodes: No significant lymphadenopathy is noted. The thoracic inlet is unremarkable. Cardiovascular: The thoracic aorta is well visualized and demonstrates no evidence of aneurysmal dilatation or dissection. Mild coronary calcifications are seen. The pulmonary artery is well visualized without evidence of filling defect to suggest pulmonary embolus. Cardiac structures are otherwise within normal limits. Lungs/Pleura: Small pleural effusions are noted bilaterally. No focal infiltrate or sizable pneumothorax is seen. No parenchymal nodules are noted. Upper abdomen: No acute findings. Musculoskeletal: Mild degenerative change of the thoracic spine is noted. Review of the MIP images confirms the above findings. IMPRESSION: Small pleural effusions bilaterally. No evidence of pulmonary emboli. No other focal abnormality is seen. Electronically Signed   By: Inez Catalina M.D.   On: 11/05/2015 09:45   Dg Pelvis Portable  11/02/2015  CLINICAL DATA:  Postoperative left hip revision. EXAM: PORTABLE PELVIS 1-2 VIEWS COMPARISON:  Intraoperative fluoroscopy 11/01/2015 FINDINGS: Postoperative left total hip arthroplasty using long-stem femoral component with additional acetabular prosthesis superiorly. Plate and screw fixation of the visualize left femoral shaft with cerclage wires present. Distal portion of the surgical hardware in the left femur is not included within the field of view. Visualize components appear well seated. Previous right total hip arthroplasty using cemented components. There is lucency at the bone-cement interface in the acetabular region which may suggest loosening. No dislocation. Visualized  pelvis appears intact. IMPRESSION: Postoperative changes in both hips as described. Electronically Signed   By: Lucienne Capers M.D.   On: 11/02/2015 00:24   Dg C-arm 61-120 Min  11/01/2015  CLINICAL DATA:  Left total hip arthroplasty with internal fixation left femoral fracture. EXAM: DG C-ARM 61-120 MIN; LEFT FEMUR 2 VIEWS COMPARISON:  Left hip 05/18/2015 FINDINGS: Intraoperative fluoroscopy is obtained for surgical control purposes during orthopedic procedure. Fluoroscopy time is recorded at 2 minutes 29 seconds. Seven spot fluoroscopic images are obtained. Spot fluoroscopic images obtained of the left hip and left femur the demonstrate placement of a left total hip arthroplasty with long-stem femoral component and with additional acetabular prosthesis. Bone matrix of the femur is demineralized. Multiple cerclage wires are utilized. Lateral plate and screw fixation of the femoral shaft. Left knee arthroplasty. Visualized components appear grossly well-seated. IMPRESSION: Intraoperative fluoroscopy obtained for surgical control purposes demonstrating left hip and left knee arthroplasties with plate and screw fixation of the left femur. Electronically Signed   By: Lucienne Capers M.D.   On: 11/01/2015 21:37   Dg Femur Min 2 Views Left  11/01/2015  CLINICAL DATA:  Left total hip arthroplasty  with internal fixation left femoral fracture. EXAM: DG C-ARM 61-120 MIN; LEFT FEMUR 2 VIEWS COMPARISON:  Left hip 05/18/2015 FINDINGS: Intraoperative fluoroscopy is obtained for surgical control purposes during orthopedic procedure. Fluoroscopy time is recorded at 2 minutes 29 seconds. Seven spot fluoroscopic images are obtained. Spot fluoroscopic images obtained of the left hip and left femur the demonstrate placement of a left total hip arthroplasty with long-stem femoral component and with additional acetabular prosthesis. Bone matrix of the femur is demineralized. Multiple cerclage wires are utilized. Lateral plate  and screw fixation of the femoral shaft. Left knee arthroplasty. Visualized components appear grossly well-seated. IMPRESSION: Intraoperative fluoroscopy obtained for surgical control purposes demonstrating left hip and left knee arthroplasties with plate and screw fixation of the left femur. Electronically Signed   By: Lucienne Capers M.D.   On: 11/01/2015 21:37   Dg Femur Port Min 2 Views Left  11/02/2015  CLINICAL DATA:  Postoperative evaluation. EXAM: LEFT FEMUR PORTABLE 2 VIEWS COMPARISON: Pelvic radiograph November 01, 2015 FINDINGS: Status post LEFT hip total arthroplasty. Lateral plate and screw fixation with cerclage wires. Lucent LEFT greater trochanter, stable. Status post total knee arthroplasty. Osteopenia. Surgical drain in place, subcutaneous gas and overlying skin staples. Included view of the RIGHT femur demonstrates old distal fracture. Moderate vascular calcifications. Cachexia. IMPRESSION: Status post LEFT total hip arthroplasty, LEFT femur plate and screw fixation, LEFT total knee arthroplasty. Surgical drain in place. Electronically Signed   By: Elon Alas M.D.   On: 11/02/2015 01:20    Assessment/Plan   Gait instability Will have patient work with PT/OT as tolerated to regain strength and restore function.  Fall precautions are in place.  Failed total hip arthroplasty  S/P revision of left total hip arthroplasty. Has orthopedics follow up. Continue norco 5-325 mg 1-2 tab q4h prn pain and valium 2.5 mg q6h prn muscle spasm. Continue eliquis 2.5 mg bid for dvt prophylaxis.   Constipation Add senokot 1 tab daily and continue linzess with miralax. D/c colace for now.  Blood loss anemia Post op, monitor cbc. He is s/p blood transfusion in hospital  Insomnia Add melatonin and monitor  Addison's disease Continue florinef 0.1 mg daily and cortef 20 mg daily and monitor  afib Rate controlled. Continue digoxin   Anxiety continue xanax 0.25 mg daily  HTN Monitor bp,  continue micardis and norvasc and monitor  GERD continue Protonix    Goals of care: short term rehabilitation   Labs/tests ordered: cbc, cmp  Family/ staff Communication: reviewed care plan with patient and nursing supervisor    Blanchie Serve, MD Internal Medicine [place], [place] [address] Cell Phone (Monday-Friday 8 am - 5 pm): [phone] On Call: [phone] and follow prompts after 5 pm and on weekends Office Phone: [phone] Office Fax: [phone]

## 2015-11-15 ENCOUNTER — Non-Acute Institutional Stay (SKILLED_NURSING_FACILITY): Payer: Medicare Other | Admitting: Adult Health

## 2015-11-15 ENCOUNTER — Encounter: Payer: Self-pay | Admitting: Adult Health

## 2015-11-15 DIAGNOSIS — R509 Fever, unspecified: Secondary | ICD-10-CM | POA: Diagnosis not present

## 2015-11-15 NOTE — Progress Notes (Signed)
Patient ID: John Lyons, male   DOB: February 10, 1946, 70 y.o.   MRN: [id number]   Location:   [facility] Room Number: 303-A Place of Service:  SNF (31)   CODE STATUS: Full Code  Allergies  Allergen Reactions  . Adhesive [Tape] Rash  . Phenobarbital Rash    Chief Complaint  Patient presents with  . Acute Visit    fever     HPI:  Staff reports that he had a fever last night of 101.6 he received tylenol and his temp returned to 99. He is not voicing any complaints of cough; shortness of breath; no changes in urine; his incision line is without redness or drainage. He does have constipation with more than 3 days since his last bm. He has not been using his I/S. He is not complaining of pain at this time.   Past Medical History:  Diagnosis Date  . Addison's disease ([place])    takes Florinef and Cortef daily  . Anxiety    takes Xanax at bedtime  . Constipation    takes Linzess daily and Miralax as needed as well as Colace  . Diverticulosis   . Dry skin    on legs  . Dysrhythmia    takes Digoxin daily  . GERD (gastroesophageal reflux disease)    takes Pantoprazole daily  . H/O hiatal hernia   . History of blood transfusion   . History of colon polyps    benign  . Hyperlipidemia    takes Pravastatin daily  . Hypertension    takes Amlodipine and Micardis daily  . Joint pain   . Joint swelling   . PONV (postoperative nausea and vomiting)   . Rheumatoid arthritis(714.0)    Rheumatoid-takes Reclast yearly and Plaquenil daily  . Weakness    right hand.Knot on right hand,states came up 2 wks ago,=    Past Surgical History:  Procedure Laterality Date  . CARDIOVASCULAR STRESS TEST  01/04/12   Dr. Gwenlyn Found [facility]  . CHOLECYSTECTOMY N/A 08/12/2015   Procedure: LAPAROSCOPIC CHOLECYSTECTOMY;  Surgeon: Mickeal Skinner, MD;  Location: [facility];  Service: General;  Laterality: N/A;  . COLONOSCOPY    . COLONOSCOPY WITH ESOPHAGOGASTRODUODENOSCOPY (EGD)  AND ESOPHAGEAL DILATION (ED)    . EYE SURGERY     cataracts  . TOTAL HIP ARTHROPLASTY  1978/1979   bilat  . TOTAL HIP ARTHROPLASTY  11/01/2015   REVISION OF LEFT TOTAL HIP ARTHROPLASTY WITH BOTH ACETABULAR AND FEMORAL COMPONENTS POSTERIOR APPROACH (Left) - Requesting RNFA  . TOTAL HIP REVISION Left 11/01/2015   Procedure: REVISION OF LEFT TOTAL HIP ARTHROPLASTY WITH BOTH ACETABULAR AND FEMORAL COMPONENTS POSTERIOR APPROACH;  Surgeon: Rod Can, MD;  Location: [facility];  Service: Orthopedics;  Laterality: Left;  Requesting RNFA  . TOTAL KNEE ARTHROPLASTY  1984   Left   . TOTAL KNEE ARTHROPLASTY  02/12/2012   right  . TOTAL KNEE ARTHROPLASTY  02/12/2012   Procedure: TOTAL KNEE ARTHROPLASTY;  Surgeon: Rudean Haskell, MD;  Location: [facility];  Service: Orthopedics;  Laterality: Right;  [facility] VASECTOMY  1979    Social History   Social History  . Marital status: Married    Spouse name: N/A  . Number of children: N/A  . Years of education: N/A   Occupational History  . Not on file.   Social History Main Topics  . Smoking status: Never Smoker  . Smokeless tobacco: Never Used  . Alcohol use No  . Drug  use: No  . Sexual activity: Not on file   Other Topics Concern  . Not on file   Social History Narrative  . No narrative on file   Family History  Problem Relation Age of Onset  . Diabetes Mother   . Heart disease Mother   . Testicular cancer Father   . Rheumatic fever Brother       VITAL SIGNS BP (!) 110/58   Pulse 70   Temp 97.9 F (36.6 C) (Oral)   Resp 18   Ht 5\' 3"  (1.6 m)   Wt 130 lb (59 kg)   BMI 23.03 kg/m   Patient's Medications  New Prescriptions   No medications on file  Previous Medications   ALPRAZOLAM (XANAX) 0.25 MG TABLET    Take 0.25 mg by mouth at bedtime.   APIXABAN (ELIQUIS) 2.5 MG TABS TABLET    Take 1 tablet (2.5 mg total) by mouth 2 (two) times daily.   ATORVASTATIN (LIPITOR) 10 MG TABLET    Take 10 mg by mouth daily.   CHOLECALCIFEROL  (VITAMIN D3) 1000 UNITS CAPS    Take 1,000 Units by mouth daily.    DIAZEPAM (VALIUM) 5 MG TABLET    Take 0.5 tablets (2.5 mg total) by mouth every 6 (six) hours as needed for muscle spasms.   DIGOXIN (LANOXIN) 0.125 MG TABLET    Take 0.125 mg by mouth daily.   FLUDROCORTISONE (FLORINEF) 0.1 MG TABLET    Take 0.1 mg by mouth daily.    FOLIC ACID (FOLVITE) A999333 MCG TABLET    Take 400 mcg by mouth daily.   HYDROCODONE-ACETAMINOPHEN (NORCO) 5-325 MG TABLET    Take 1-2 tablets by mouth every 4 (four) hours as needed for moderate pain.   HYDROCORTISONE (CORTEF) 20 MG TABLET    Take 20 mg by mouth daily.    HYDROXYCHLOROQUINE (PLAQUENIL) 200 MG TABLET    Take 200 mg by mouth daily.   LINACLOTIDE (LINZESS) 145 MCG CAPS CAPSULE    Take 145 mcg by mouth daily as needed (constipation).   MELATONIN 5 MG TABS    Take 1 tablet by mouth at bedtime.   MULTIPLE VITAMIN (MULTIVITAMIN WITH MINERALS) TABS    Take 1 tablet by mouth daily.   NUTRITIONAL SUPPLEMENTS (NUTRITIONAL SUPPLEMENT PO)    Take 90 mLs by mouth 3 (three) times daily.   ONDANSETRON (ZOFRAN) 4 MG TABLET    Take 1 tablet (4 mg total) by mouth every 6 (six) hours as needed for nausea.   PANTOPRAZOLE (PROTONIX) 40 MG TABLET    Take 40 mg by mouth daily.   POLYETHYLENE GLYCOL (MIRALAX / GLYCOLAX) PACKET    Take 17 g by mouth daily as needed for mild constipation.   PROPYLENE GLYCOL-GLYCERIN (SOOTHE) 0.6-0.6 % SOLN    Place 1 drop into both eyes daily as needed (dryness).   SENNA (SENOKOT) 8.6 MG TABLET    Take 1 tablet by mouth at bedtime.  Modified Medications   No medications on file  Discontinued Medications   DOCUSATE SODIUM (COLACE) 100 MG CAPSULE    Take 1 capsule (100 mg total) by mouth 2 (two) times daily.   SENNA (SENOKOT) 8.6 MG TABS TABLET    Take 2 tablets (17.2 mg total) by mouth at bedtime.     SIGNIFICANT DIAGNOSTIC EXAMS   LABS REVIEWED:   11-05-15: wbc 10.4; hgb 8.9; hct 25.6; mcv 85.9; plt 208; glucose 88; bun 6; creat 0.42;  k+ 3.6; na++ 136  Review of Systems  Constitutional: Positive for fever. Negative for malaise/fatigue.  Respiratory: Negative for cough and shortness of breath.   Cardiovascular: Negative for chest pain, palpitations and leg swelling.  Gastrointestinal: Positive for constipation. Negative for abdominal pain and heartburn.  Musculoskeletal: Negative for back pain, joint pain and myalgias.  Skin: Negative.   Neurological: Negative for dizziness.  Psychiatric/Behavioral: The patient is not nervous/anxious.     Physical Exam  Constitutional: He is oriented to person, place, and time. No distress.  Eyes: Conjunctivae are normal.  Neck: Neck supple. No JVD present. No thyromegaly present.  Cardiovascular: Normal rate, regular rhythm and intact distal pulses.   Respiratory: Effort normal and breath sounds normal. No respiratory distress. He has no wheezes.  GI: Soft. Bowel sounds are normal. He exhibits no distension. There is no tenderness.  Musculoskeletal: He exhibits no edema.  Able to move all extremities   Lymphadenopathy:    He has no cervical adenopathy.  Neurological: He is alert and oriented to person, place, and time.  Skin: Skin is warm and dry. He is not diaphoretic.  Incision line without sings of infection or inflammation   Psychiatric: He has a normal mood and affect.     ASSESSMENT/ PLAN:  1. Fever: at this time he not febrile; will get cbc; cmp. Will have him use his I/S four times daily. Will change his linzess 145 mcg daily and will monitor his status.   Time spent with patient  30  minutes >50% time spent counseling; reviewing medical record; tests; labs; and developing future plan of care    Ok Edwards NP [facility]  Contact [phone] Monday through Friday 8am- 5pm  After hours call [phone]

## 2015-11-16 DIAGNOSIS — G4700 Insomnia, unspecified: Secondary | ICD-10-CM | POA: Diagnosis not present

## 2015-11-16 DIAGNOSIS — R110 Nausea: Secondary | ICD-10-CM | POA: Diagnosis not present

## 2015-11-16 DIAGNOSIS — M6281 Muscle weakness (generalized): Secondary | ICD-10-CM | POA: Diagnosis not present

## 2015-11-16 DIAGNOSIS — Z471 Aftercare following joint replacement surgery: Secondary | ICD-10-CM | POA: Diagnosis not present

## 2015-11-16 DIAGNOSIS — K581 Irritable bowel syndrome with constipation: Secondary | ICD-10-CM | POA: Diagnosis not present

## 2015-11-16 DIAGNOSIS — H04129 Dry eye syndrome of unspecified lacrimal gland: Secondary | ICD-10-CM | POA: Diagnosis not present

## 2015-11-16 DIAGNOSIS — R52 Pain, unspecified: Secondary | ICD-10-CM | POA: Diagnosis not present

## 2015-11-16 DIAGNOSIS — E275 Adrenomedullary hyperfunction: Secondary | ICD-10-CM | POA: Diagnosis not present

## 2015-11-16 DIAGNOSIS — Z96642 Presence of left artificial hip joint: Secondary | ICD-10-CM | POA: Diagnosis not present

## 2015-11-16 DIAGNOSIS — M150 Primary generalized (osteo)arthritis: Secondary | ICD-10-CM | POA: Diagnosis not present

## 2015-11-16 DIAGNOSIS — R262 Difficulty in walking, not elsewhere classified: Secondary | ICD-10-CM | POA: Diagnosis not present

## 2015-11-16 DIAGNOSIS — L299 Pruritus, unspecified: Secondary | ICD-10-CM | POA: Diagnosis not present

## 2015-11-16 DIAGNOSIS — F419 Anxiety disorder, unspecified: Secondary | ICD-10-CM | POA: Diagnosis not present

## 2015-11-16 DIAGNOSIS — Z7901 Long term (current) use of anticoagulants: Secondary | ICD-10-CM | POA: Diagnosis not present

## 2015-11-16 DIAGNOSIS — K5900 Constipation, unspecified: Secondary | ICD-10-CM | POA: Diagnosis not present

## 2015-11-16 DIAGNOSIS — M069 Rheumatoid arthritis, unspecified: Secondary | ICD-10-CM | POA: Diagnosis not present

## 2015-11-16 DIAGNOSIS — I499 Cardiac arrhythmia, unspecified: Secondary | ICD-10-CM | POA: Diagnosis not present

## 2015-11-16 DIAGNOSIS — M25559 Pain in unspecified hip: Secondary | ICD-10-CM | POA: Diagnosis not present

## 2015-11-16 DIAGNOSIS — L89151 Pressure ulcer of sacral region, stage 1: Secondary | ICD-10-CM | POA: Diagnosis not present

## 2015-11-16 DIAGNOSIS — T84018D Broken internal joint prosthesis, other site, subsequent encounter: Secondary | ICD-10-CM | POA: Diagnosis not present

## 2015-11-16 DIAGNOSIS — E559 Vitamin D deficiency, unspecified: Secondary | ICD-10-CM | POA: Diagnosis not present

## 2015-11-16 DIAGNOSIS — M810 Age-related osteoporosis without current pathological fracture: Secondary | ICD-10-CM | POA: Diagnosis not present

## 2015-11-16 DIAGNOSIS — E785 Hyperlipidemia, unspecified: Secondary | ICD-10-CM | POA: Diagnosis not present

## 2015-11-16 DIAGNOSIS — K219 Gastro-esophageal reflux disease without esophagitis: Secondary | ICD-10-CM | POA: Diagnosis not present

## 2015-11-16 DIAGNOSIS — M0589 Other rheumatoid arthritis with rheumatoid factor of multiple sites: Secondary | ICD-10-CM | POA: Diagnosis not present

## 2015-11-16 DIAGNOSIS — E271 Primary adrenocortical insufficiency: Secondary | ICD-10-CM | POA: Diagnosis not present

## 2015-11-16 DIAGNOSIS — I10 Essential (primary) hypertension: Secondary | ICD-10-CM | POA: Diagnosis not present

## 2015-12-08 DIAGNOSIS — Z96642 Presence of left artificial hip joint: Secondary | ICD-10-CM | POA: Diagnosis not present

## 2015-12-08 DIAGNOSIS — Z471 Aftercare following joint replacement surgery: Secondary | ICD-10-CM | POA: Diagnosis not present

## 2015-12-09 DIAGNOSIS — E271 Primary adrenocortical insufficiency: Secondary | ICD-10-CM | POA: Diagnosis not present

## 2015-12-09 DIAGNOSIS — M150 Primary generalized (osteo)arthritis: Secondary | ICD-10-CM | POA: Diagnosis not present

## 2015-12-09 DIAGNOSIS — M810 Age-related osteoporosis without current pathological fracture: Secondary | ICD-10-CM | POA: Diagnosis not present

## 2015-12-09 DIAGNOSIS — M0589 Other rheumatoid arthritis with rheumatoid factor of multiple sites: Secondary | ICD-10-CM | POA: Diagnosis not present

## 2015-12-20 DIAGNOSIS — R262 Difficulty in walking, not elsewhere classified: Secondary | ICD-10-CM | POA: Diagnosis not present

## 2015-12-20 DIAGNOSIS — E275 Adrenomedullary hyperfunction: Secondary | ICD-10-CM | POA: Diagnosis not present

## 2015-12-20 DIAGNOSIS — Z96642 Presence of left artificial hip joint: Secondary | ICD-10-CM | POA: Diagnosis not present

## 2015-12-20 DIAGNOSIS — Z471 Aftercare following joint replacement surgery: Secondary | ICD-10-CM | POA: Diagnosis not present

## 2016-01-08 DIAGNOSIS — E271 Primary adrenocortical insufficiency: Secondary | ICD-10-CM | POA: Diagnosis not present

## 2016-01-08 DIAGNOSIS — K219 Gastro-esophageal reflux disease without esophagitis: Secondary | ICD-10-CM | POA: Diagnosis not present

## 2016-01-08 DIAGNOSIS — I10 Essential (primary) hypertension: Secondary | ICD-10-CM | POA: Diagnosis not present

## 2016-01-08 DIAGNOSIS — D649 Anemia, unspecified: Secondary | ICD-10-CM | POA: Diagnosis not present

## 2016-01-08 DIAGNOSIS — Z96642 Presence of left artificial hip joint: Secondary | ICD-10-CM | POA: Diagnosis not present

## 2016-01-08 DIAGNOSIS — M06852 Other specified rheumatoid arthritis, left hip: Secondary | ICD-10-CM | POA: Diagnosis not present

## 2016-01-08 DIAGNOSIS — M24541 Contracture, right hand: Secondary | ICD-10-CM | POA: Diagnosis not present

## 2016-01-08 DIAGNOSIS — M24542 Contracture, left hand: Secondary | ICD-10-CM | POA: Diagnosis not present

## 2016-01-08 DIAGNOSIS — S72002D Fracture of unspecified part of neck of left femur, subsequent encounter for closed fracture with routine healing: Secondary | ICD-10-CM | POA: Diagnosis not present

## 2016-01-08 DIAGNOSIS — E559 Vitamin D deficiency, unspecified: Secondary | ICD-10-CM | POA: Diagnosis not present

## 2016-01-08 DIAGNOSIS — M6281 Muscle weakness (generalized): Secondary | ICD-10-CM | POA: Diagnosis not present

## 2016-01-09 DIAGNOSIS — M06852 Other specified rheumatoid arthritis, left hip: Secondary | ICD-10-CM | POA: Diagnosis not present

## 2016-01-09 DIAGNOSIS — M6281 Muscle weakness (generalized): Secondary | ICD-10-CM | POA: Diagnosis not present

## 2016-01-09 DIAGNOSIS — D649 Anemia, unspecified: Secondary | ICD-10-CM | POA: Diagnosis not present

## 2016-01-09 DIAGNOSIS — Z96642 Presence of left artificial hip joint: Secondary | ICD-10-CM | POA: Diagnosis not present

## 2016-01-09 DIAGNOSIS — S72002D Fracture of unspecified part of neck of left femur, subsequent encounter for closed fracture with routine healing: Secondary | ICD-10-CM | POA: Diagnosis not present

## 2016-01-09 DIAGNOSIS — I10 Essential (primary) hypertension: Secondary | ICD-10-CM | POA: Diagnosis not present

## 2016-01-10 DIAGNOSIS — S72002D Fracture of unspecified part of neck of left femur, subsequent encounter for closed fracture with routine healing: Secondary | ICD-10-CM | POA: Diagnosis not present

## 2016-01-10 DIAGNOSIS — I10 Essential (primary) hypertension: Secondary | ICD-10-CM | POA: Diagnosis not present

## 2016-01-10 DIAGNOSIS — M06852 Other specified rheumatoid arthritis, left hip: Secondary | ICD-10-CM | POA: Diagnosis not present

## 2016-01-10 DIAGNOSIS — M6281 Muscle weakness (generalized): Secondary | ICD-10-CM | POA: Diagnosis not present

## 2016-01-10 DIAGNOSIS — Z96642 Presence of left artificial hip joint: Secondary | ICD-10-CM | POA: Diagnosis not present

## 2016-01-10 DIAGNOSIS — D649 Anemia, unspecified: Secondary | ICD-10-CM | POA: Diagnosis not present

## 2016-01-11 DIAGNOSIS — D649 Anemia, unspecified: Secondary | ICD-10-CM | POA: Diagnosis not present

## 2016-01-11 DIAGNOSIS — S72002D Fracture of unspecified part of neck of left femur, subsequent encounter for closed fracture with routine healing: Secondary | ICD-10-CM | POA: Diagnosis not present

## 2016-01-11 DIAGNOSIS — Z96642 Presence of left artificial hip joint: Secondary | ICD-10-CM | POA: Diagnosis not present

## 2016-01-11 DIAGNOSIS — M6281 Muscle weakness (generalized): Secondary | ICD-10-CM | POA: Diagnosis not present

## 2016-01-11 DIAGNOSIS — M06852 Other specified rheumatoid arthritis, left hip: Secondary | ICD-10-CM | POA: Diagnosis not present

## 2016-01-11 DIAGNOSIS — I10 Essential (primary) hypertension: Secondary | ICD-10-CM | POA: Diagnosis not present

## 2016-01-12 DIAGNOSIS — I10 Essential (primary) hypertension: Secondary | ICD-10-CM | POA: Diagnosis not present

## 2016-01-12 DIAGNOSIS — D649 Anemia, unspecified: Secondary | ICD-10-CM | POA: Diagnosis not present

## 2016-01-12 DIAGNOSIS — S72002D Fracture of unspecified part of neck of left femur, subsequent encounter for closed fracture with routine healing: Secondary | ICD-10-CM | POA: Diagnosis not present

## 2016-01-12 DIAGNOSIS — M06852 Other specified rheumatoid arthritis, left hip: Secondary | ICD-10-CM | POA: Diagnosis not present

## 2016-01-12 DIAGNOSIS — M6281 Muscle weakness (generalized): Secondary | ICD-10-CM | POA: Diagnosis not present

## 2016-01-12 DIAGNOSIS — Z96642 Presence of left artificial hip joint: Secondary | ICD-10-CM | POA: Diagnosis not present

## 2016-01-14 DIAGNOSIS — I10 Essential (primary) hypertension: Secondary | ICD-10-CM | POA: Diagnosis not present

## 2016-01-14 DIAGNOSIS — M6281 Muscle weakness (generalized): Secondary | ICD-10-CM | POA: Diagnosis not present

## 2016-01-14 DIAGNOSIS — M06852 Other specified rheumatoid arthritis, left hip: Secondary | ICD-10-CM | POA: Diagnosis not present

## 2016-01-14 DIAGNOSIS — D649 Anemia, unspecified: Secondary | ICD-10-CM | POA: Diagnosis not present

## 2016-01-14 DIAGNOSIS — S72002D Fracture of unspecified part of neck of left femur, subsequent encounter for closed fracture with routine healing: Secondary | ICD-10-CM | POA: Diagnosis not present

## 2016-01-14 DIAGNOSIS — Z96642 Presence of left artificial hip joint: Secondary | ICD-10-CM | POA: Diagnosis not present

## 2016-01-17 DIAGNOSIS — I10 Essential (primary) hypertension: Secondary | ICD-10-CM | POA: Diagnosis not present

## 2016-01-17 DIAGNOSIS — S72002D Fracture of unspecified part of neck of left femur, subsequent encounter for closed fracture with routine healing: Secondary | ICD-10-CM | POA: Diagnosis not present

## 2016-01-17 DIAGNOSIS — D649 Anemia, unspecified: Secondary | ICD-10-CM | POA: Diagnosis not present

## 2016-01-17 DIAGNOSIS — Z96642 Presence of left artificial hip joint: Secondary | ICD-10-CM | POA: Diagnosis not present

## 2016-01-17 DIAGNOSIS — M6281 Muscle weakness (generalized): Secondary | ICD-10-CM | POA: Diagnosis not present

## 2016-01-17 DIAGNOSIS — M06852 Other specified rheumatoid arthritis, left hip: Secondary | ICD-10-CM | POA: Diagnosis not present

## 2016-01-18 DIAGNOSIS — D649 Anemia, unspecified: Secondary | ICD-10-CM | POA: Diagnosis not present

## 2016-01-18 DIAGNOSIS — I10 Essential (primary) hypertension: Secondary | ICD-10-CM | POA: Diagnosis not present

## 2016-01-18 DIAGNOSIS — M6281 Muscle weakness (generalized): Secondary | ICD-10-CM | POA: Diagnosis not present

## 2016-01-18 DIAGNOSIS — R05 Cough: Secondary | ICD-10-CM | POA: Diagnosis not present

## 2016-01-18 DIAGNOSIS — Z96642 Presence of left artificial hip joint: Secondary | ICD-10-CM | POA: Diagnosis not present

## 2016-01-18 DIAGNOSIS — M06852 Other specified rheumatoid arthritis, left hip: Secondary | ICD-10-CM | POA: Diagnosis not present

## 2016-01-18 DIAGNOSIS — S72002D Fracture of unspecified part of neck of left femur, subsequent encounter for closed fracture with routine healing: Secondary | ICD-10-CM | POA: Diagnosis not present

## 2016-01-19 DIAGNOSIS — M6281 Muscle weakness (generalized): Secondary | ICD-10-CM | POA: Diagnosis not present

## 2016-01-19 DIAGNOSIS — D649 Anemia, unspecified: Secondary | ICD-10-CM | POA: Diagnosis not present

## 2016-01-19 DIAGNOSIS — S72002D Fracture of unspecified part of neck of left femur, subsequent encounter for closed fracture with routine healing: Secondary | ICD-10-CM | POA: Diagnosis not present

## 2016-01-19 DIAGNOSIS — I10 Essential (primary) hypertension: Secondary | ICD-10-CM | POA: Diagnosis not present

## 2016-01-19 DIAGNOSIS — Z96642 Presence of left artificial hip joint: Secondary | ICD-10-CM | POA: Diagnosis not present

## 2016-01-19 DIAGNOSIS — M06852 Other specified rheumatoid arthritis, left hip: Secondary | ICD-10-CM | POA: Diagnosis not present

## 2016-01-20 DIAGNOSIS — Z96642 Presence of left artificial hip joint: Secondary | ICD-10-CM | POA: Diagnosis not present

## 2016-01-20 DIAGNOSIS — S72002D Fracture of unspecified part of neck of left femur, subsequent encounter for closed fracture with routine healing: Secondary | ICD-10-CM | POA: Diagnosis not present

## 2016-01-20 DIAGNOSIS — I10 Essential (primary) hypertension: Secondary | ICD-10-CM | POA: Diagnosis not present

## 2016-01-20 DIAGNOSIS — M6281 Muscle weakness (generalized): Secondary | ICD-10-CM | POA: Diagnosis not present

## 2016-01-20 DIAGNOSIS — D649 Anemia, unspecified: Secondary | ICD-10-CM | POA: Diagnosis not present

## 2016-01-20 DIAGNOSIS — M06852 Other specified rheumatoid arthritis, left hip: Secondary | ICD-10-CM | POA: Diagnosis not present

## 2016-01-21 DIAGNOSIS — S72002D Fracture of unspecified part of neck of left femur, subsequent encounter for closed fracture with routine healing: Secondary | ICD-10-CM | POA: Diagnosis not present

## 2016-01-21 DIAGNOSIS — Z96642 Presence of left artificial hip joint: Secondary | ICD-10-CM | POA: Diagnosis not present

## 2016-01-21 DIAGNOSIS — M06852 Other specified rheumatoid arthritis, left hip: Secondary | ICD-10-CM | POA: Diagnosis not present

## 2016-01-21 DIAGNOSIS — I10 Essential (primary) hypertension: Secondary | ICD-10-CM | POA: Diagnosis not present

## 2016-01-21 DIAGNOSIS — M6281 Muscle weakness (generalized): Secondary | ICD-10-CM | POA: Diagnosis not present

## 2016-01-21 DIAGNOSIS — D649 Anemia, unspecified: Secondary | ICD-10-CM | POA: Diagnosis not present

## 2016-01-24 DIAGNOSIS — S72002D Fracture of unspecified part of neck of left femur, subsequent encounter for closed fracture with routine healing: Secondary | ICD-10-CM | POA: Diagnosis not present

## 2016-01-24 DIAGNOSIS — M06852 Other specified rheumatoid arthritis, left hip: Secondary | ICD-10-CM | POA: Diagnosis not present

## 2016-01-24 DIAGNOSIS — D649 Anemia, unspecified: Secondary | ICD-10-CM | POA: Diagnosis not present

## 2016-01-24 DIAGNOSIS — Z96642 Presence of left artificial hip joint: Secondary | ICD-10-CM | POA: Diagnosis not present

## 2016-01-24 DIAGNOSIS — I10 Essential (primary) hypertension: Secondary | ICD-10-CM | POA: Diagnosis not present

## 2016-01-24 DIAGNOSIS — M6281 Muscle weakness (generalized): Secondary | ICD-10-CM | POA: Diagnosis not present

## 2016-01-25 DIAGNOSIS — D649 Anemia, unspecified: Secondary | ICD-10-CM | POA: Diagnosis not present

## 2016-01-25 DIAGNOSIS — I10 Essential (primary) hypertension: Secondary | ICD-10-CM | POA: Diagnosis not present

## 2016-01-25 DIAGNOSIS — S72002D Fracture of unspecified part of neck of left femur, subsequent encounter for closed fracture with routine healing: Secondary | ICD-10-CM | POA: Diagnosis not present

## 2016-01-25 DIAGNOSIS — Z96642 Presence of left artificial hip joint: Secondary | ICD-10-CM | POA: Diagnosis not present

## 2016-01-25 DIAGNOSIS — M06852 Other specified rheumatoid arthritis, left hip: Secondary | ICD-10-CM | POA: Diagnosis not present

## 2016-01-25 DIAGNOSIS — M6281 Muscle weakness (generalized): Secondary | ICD-10-CM | POA: Diagnosis not present

## 2016-01-26 DIAGNOSIS — S72002D Fracture of unspecified part of neck of left femur, subsequent encounter for closed fracture with routine healing: Secondary | ICD-10-CM | POA: Diagnosis not present

## 2016-01-26 DIAGNOSIS — D649 Anemia, unspecified: Secondary | ICD-10-CM | POA: Diagnosis not present

## 2016-01-26 DIAGNOSIS — M6281 Muscle weakness (generalized): Secondary | ICD-10-CM | POA: Diagnosis not present

## 2016-01-26 DIAGNOSIS — M06852 Other specified rheumatoid arthritis, left hip: Secondary | ICD-10-CM | POA: Diagnosis not present

## 2016-01-26 DIAGNOSIS — Z96642 Presence of left artificial hip joint: Secondary | ICD-10-CM | POA: Diagnosis not present

## 2016-01-26 DIAGNOSIS — I10 Essential (primary) hypertension: Secondary | ICD-10-CM | POA: Diagnosis not present

## 2016-01-27 DIAGNOSIS — Z471 Aftercare following joint replacement surgery: Secondary | ICD-10-CM | POA: Diagnosis not present

## 2016-01-27 DIAGNOSIS — M06852 Other specified rheumatoid arthritis, left hip: Secondary | ICD-10-CM | POA: Diagnosis not present

## 2016-01-27 DIAGNOSIS — Z96642 Presence of left artificial hip joint: Secondary | ICD-10-CM | POA: Diagnosis not present

## 2016-01-27 DIAGNOSIS — I10 Essential (primary) hypertension: Secondary | ICD-10-CM | POA: Diagnosis not present

## 2016-01-27 DIAGNOSIS — S72002D Fracture of unspecified part of neck of left femur, subsequent encounter for closed fracture with routine healing: Secondary | ICD-10-CM | POA: Diagnosis not present

## 2016-01-27 DIAGNOSIS — M6281 Muscle weakness (generalized): Secondary | ICD-10-CM | POA: Diagnosis not present

## 2016-01-27 DIAGNOSIS — D649 Anemia, unspecified: Secondary | ICD-10-CM | POA: Diagnosis not present

## 2016-01-31 DIAGNOSIS — Z96642 Presence of left artificial hip joint: Secondary | ICD-10-CM | POA: Diagnosis not present

## 2016-01-31 DIAGNOSIS — D649 Anemia, unspecified: Secondary | ICD-10-CM | POA: Diagnosis not present

## 2016-01-31 DIAGNOSIS — M6281 Muscle weakness (generalized): Secondary | ICD-10-CM | POA: Diagnosis not present

## 2016-01-31 DIAGNOSIS — S72002D Fracture of unspecified part of neck of left femur, subsequent encounter for closed fracture with routine healing: Secondary | ICD-10-CM | POA: Diagnosis not present

## 2016-01-31 DIAGNOSIS — I10 Essential (primary) hypertension: Secondary | ICD-10-CM | POA: Diagnosis not present

## 2016-01-31 DIAGNOSIS — M06852 Other specified rheumatoid arthritis, left hip: Secondary | ICD-10-CM | POA: Diagnosis not present

## 2016-02-01 DIAGNOSIS — Z96642 Presence of left artificial hip joint: Secondary | ICD-10-CM | POA: Diagnosis not present

## 2016-02-01 DIAGNOSIS — I10 Essential (primary) hypertension: Secondary | ICD-10-CM | POA: Diagnosis not present

## 2016-02-01 DIAGNOSIS — M06852 Other specified rheumatoid arthritis, left hip: Secondary | ICD-10-CM | POA: Diagnosis not present

## 2016-02-01 DIAGNOSIS — D649 Anemia, unspecified: Secondary | ICD-10-CM | POA: Diagnosis not present

## 2016-02-01 DIAGNOSIS — S72002D Fracture of unspecified part of neck of left femur, subsequent encounter for closed fracture with routine healing: Secondary | ICD-10-CM | POA: Diagnosis not present

## 2016-02-01 DIAGNOSIS — M6281 Muscle weakness (generalized): Secondary | ICD-10-CM | POA: Diagnosis not present

## 2016-02-03 DIAGNOSIS — D649 Anemia, unspecified: Secondary | ICD-10-CM | POA: Diagnosis not present

## 2016-02-03 DIAGNOSIS — M06852 Other specified rheumatoid arthritis, left hip: Secondary | ICD-10-CM | POA: Diagnosis not present

## 2016-02-03 DIAGNOSIS — S72002D Fracture of unspecified part of neck of left femur, subsequent encounter for closed fracture with routine healing: Secondary | ICD-10-CM | POA: Diagnosis not present

## 2016-02-03 DIAGNOSIS — Z96642 Presence of left artificial hip joint: Secondary | ICD-10-CM | POA: Diagnosis not present

## 2016-02-03 DIAGNOSIS — I10 Essential (primary) hypertension: Secondary | ICD-10-CM | POA: Diagnosis not present

## 2016-02-03 DIAGNOSIS — M6281 Muscle weakness (generalized): Secondary | ICD-10-CM | POA: Diagnosis not present

## 2016-02-07 DIAGNOSIS — Z471 Aftercare following joint replacement surgery: Secondary | ICD-10-CM | POA: Diagnosis not present

## 2016-02-07 DIAGNOSIS — Z96642 Presence of left artificial hip joint: Secondary | ICD-10-CM | POA: Diagnosis not present

## 2016-02-10 DIAGNOSIS — Z96642 Presence of left artificial hip joint: Secondary | ICD-10-CM | POA: Diagnosis not present

## 2016-02-10 DIAGNOSIS — M06852 Other specified rheumatoid arthritis, left hip: Secondary | ICD-10-CM | POA: Diagnosis not present

## 2016-02-10 DIAGNOSIS — M6281 Muscle weakness (generalized): Secondary | ICD-10-CM | POA: Diagnosis not present

## 2016-02-10 DIAGNOSIS — I10 Essential (primary) hypertension: Secondary | ICD-10-CM | POA: Diagnosis not present

## 2016-02-10 DIAGNOSIS — S72002D Fracture of unspecified part of neck of left femur, subsequent encounter for closed fracture with routine healing: Secondary | ICD-10-CM | POA: Diagnosis not present

## 2016-02-10 DIAGNOSIS — D649 Anemia, unspecified: Secondary | ICD-10-CM | POA: Diagnosis not present

## 2016-02-11 DIAGNOSIS — I10 Essential (primary) hypertension: Secondary | ICD-10-CM | POA: Diagnosis not present

## 2016-02-11 DIAGNOSIS — S72002D Fracture of unspecified part of neck of left femur, subsequent encounter for closed fracture with routine healing: Secondary | ICD-10-CM | POA: Diagnosis not present

## 2016-02-11 DIAGNOSIS — M06852 Other specified rheumatoid arthritis, left hip: Secondary | ICD-10-CM | POA: Diagnosis not present

## 2016-02-11 DIAGNOSIS — D649 Anemia, unspecified: Secondary | ICD-10-CM | POA: Diagnosis not present

## 2016-02-11 DIAGNOSIS — M6281 Muscle weakness (generalized): Secondary | ICD-10-CM | POA: Diagnosis not present

## 2016-02-11 DIAGNOSIS — Z96642 Presence of left artificial hip joint: Secondary | ICD-10-CM | POA: Diagnosis not present

## 2016-02-14 DIAGNOSIS — D649 Anemia, unspecified: Secondary | ICD-10-CM | POA: Diagnosis not present

## 2016-02-14 DIAGNOSIS — I10 Essential (primary) hypertension: Secondary | ICD-10-CM | POA: Diagnosis not present

## 2016-02-14 DIAGNOSIS — S72002D Fracture of unspecified part of neck of left femur, subsequent encounter for closed fracture with routine healing: Secondary | ICD-10-CM | POA: Diagnosis not present

## 2016-02-14 DIAGNOSIS — Z96642 Presence of left artificial hip joint: Secondary | ICD-10-CM | POA: Diagnosis not present

## 2016-02-14 DIAGNOSIS — M6281 Muscle weakness (generalized): Secondary | ICD-10-CM | POA: Diagnosis not present

## 2016-02-14 DIAGNOSIS — M06852 Other specified rheumatoid arthritis, left hip: Secondary | ICD-10-CM | POA: Diagnosis not present

## 2016-02-16 DIAGNOSIS — S72002D Fracture of unspecified part of neck of left femur, subsequent encounter for closed fracture with routine healing: Secondary | ICD-10-CM | POA: Diagnosis not present

## 2016-02-16 DIAGNOSIS — M06852 Other specified rheumatoid arthritis, left hip: Secondary | ICD-10-CM | POA: Diagnosis not present

## 2016-02-16 DIAGNOSIS — Z96642 Presence of left artificial hip joint: Secondary | ICD-10-CM | POA: Diagnosis not present

## 2016-02-16 DIAGNOSIS — M6281 Muscle weakness (generalized): Secondary | ICD-10-CM | POA: Diagnosis not present

## 2016-02-16 DIAGNOSIS — I10 Essential (primary) hypertension: Secondary | ICD-10-CM | POA: Diagnosis not present

## 2016-02-16 DIAGNOSIS — D649 Anemia, unspecified: Secondary | ICD-10-CM | POA: Diagnosis not present

## 2016-02-17 DIAGNOSIS — Z23 Encounter for immunization: Secondary | ICD-10-CM | POA: Diagnosis not present

## 2016-02-17 DIAGNOSIS — J189 Pneumonia, unspecified organism: Secondary | ICD-10-CM | POA: Diagnosis not present

## 2016-02-17 DIAGNOSIS — M069 Rheumatoid arthritis, unspecified: Secondary | ICD-10-CM | POA: Diagnosis not present

## 2016-02-17 DIAGNOSIS — I10 Essential (primary) hypertension: Secondary | ICD-10-CM | POA: Diagnosis not present

## 2016-02-17 DIAGNOSIS — R05 Cough: Secondary | ICD-10-CM | POA: Diagnosis not present

## 2016-02-18 DIAGNOSIS — I10 Essential (primary) hypertension: Secondary | ICD-10-CM | POA: Diagnosis not present

## 2016-02-18 DIAGNOSIS — Z96642 Presence of left artificial hip joint: Secondary | ICD-10-CM | POA: Diagnosis not present

## 2016-02-18 DIAGNOSIS — D649 Anemia, unspecified: Secondary | ICD-10-CM | POA: Diagnosis not present

## 2016-02-18 DIAGNOSIS — M06852 Other specified rheumatoid arthritis, left hip: Secondary | ICD-10-CM | POA: Diagnosis not present

## 2016-02-18 DIAGNOSIS — M6281 Muscle weakness (generalized): Secondary | ICD-10-CM | POA: Diagnosis not present

## 2016-02-18 DIAGNOSIS — S72002D Fracture of unspecified part of neck of left femur, subsequent encounter for closed fracture with routine healing: Secondary | ICD-10-CM | POA: Diagnosis not present

## 2016-02-21 DIAGNOSIS — M6281 Muscle weakness (generalized): Secondary | ICD-10-CM | POA: Diagnosis not present

## 2016-02-21 DIAGNOSIS — I10 Essential (primary) hypertension: Secondary | ICD-10-CM | POA: Diagnosis not present

## 2016-02-21 DIAGNOSIS — Z96642 Presence of left artificial hip joint: Secondary | ICD-10-CM | POA: Diagnosis not present

## 2016-02-21 DIAGNOSIS — D649 Anemia, unspecified: Secondary | ICD-10-CM | POA: Diagnosis not present

## 2016-02-21 DIAGNOSIS — M06852 Other specified rheumatoid arthritis, left hip: Secondary | ICD-10-CM | POA: Diagnosis not present

## 2016-02-21 DIAGNOSIS — S72002D Fracture of unspecified part of neck of left femur, subsequent encounter for closed fracture with routine healing: Secondary | ICD-10-CM | POA: Diagnosis not present

## 2016-02-23 DIAGNOSIS — I10 Essential (primary) hypertension: Secondary | ICD-10-CM | POA: Diagnosis not present

## 2016-02-23 DIAGNOSIS — Z96642 Presence of left artificial hip joint: Secondary | ICD-10-CM | POA: Diagnosis not present

## 2016-02-23 DIAGNOSIS — M06852 Other specified rheumatoid arthritis, left hip: Secondary | ICD-10-CM | POA: Diagnosis not present

## 2016-02-23 DIAGNOSIS — S72002D Fracture of unspecified part of neck of left femur, subsequent encounter for closed fracture with routine healing: Secondary | ICD-10-CM | POA: Diagnosis not present

## 2016-02-23 DIAGNOSIS — D649 Anemia, unspecified: Secondary | ICD-10-CM | POA: Diagnosis not present

## 2016-02-23 DIAGNOSIS — M6281 Muscle weakness (generalized): Secondary | ICD-10-CM | POA: Diagnosis not present

## 2016-02-24 DIAGNOSIS — S72002D Fracture of unspecified part of neck of left femur, subsequent encounter for closed fracture with routine healing: Secondary | ICD-10-CM | POA: Diagnosis not present

## 2016-02-24 DIAGNOSIS — Z96642 Presence of left artificial hip joint: Secondary | ICD-10-CM | POA: Diagnosis not present

## 2016-02-24 DIAGNOSIS — D649 Anemia, unspecified: Secondary | ICD-10-CM | POA: Diagnosis not present

## 2016-02-24 DIAGNOSIS — M6281 Muscle weakness (generalized): Secondary | ICD-10-CM | POA: Diagnosis not present

## 2016-02-24 DIAGNOSIS — I10 Essential (primary) hypertension: Secondary | ICD-10-CM | POA: Diagnosis not present

## 2016-02-24 DIAGNOSIS — M06852 Other specified rheumatoid arthritis, left hip: Secondary | ICD-10-CM | POA: Diagnosis not present

## 2016-03-02 DIAGNOSIS — M06852 Other specified rheumatoid arthritis, left hip: Secondary | ICD-10-CM | POA: Diagnosis not present

## 2016-03-02 DIAGNOSIS — Z96642 Presence of left artificial hip joint: Secondary | ICD-10-CM | POA: Diagnosis not present

## 2016-03-02 DIAGNOSIS — M6281 Muscle weakness (generalized): Secondary | ICD-10-CM | POA: Diagnosis not present

## 2016-03-02 DIAGNOSIS — I10 Essential (primary) hypertension: Secondary | ICD-10-CM | POA: Diagnosis not present

## 2016-03-02 DIAGNOSIS — S72002D Fracture of unspecified part of neck of left femur, subsequent encounter for closed fracture with routine healing: Secondary | ICD-10-CM | POA: Diagnosis not present

## 2016-03-02 DIAGNOSIS — D649 Anemia, unspecified: Secondary | ICD-10-CM | POA: Diagnosis not present

## 2016-03-03 DIAGNOSIS — D649 Anemia, unspecified: Secondary | ICD-10-CM | POA: Diagnosis not present

## 2016-03-03 DIAGNOSIS — M06852 Other specified rheumatoid arthritis, left hip: Secondary | ICD-10-CM | POA: Diagnosis not present

## 2016-03-03 DIAGNOSIS — S72002D Fracture of unspecified part of neck of left femur, subsequent encounter for closed fracture with routine healing: Secondary | ICD-10-CM | POA: Diagnosis not present

## 2016-03-03 DIAGNOSIS — I10 Essential (primary) hypertension: Secondary | ICD-10-CM | POA: Diagnosis not present

## 2016-03-03 DIAGNOSIS — Z96642 Presence of left artificial hip joint: Secondary | ICD-10-CM | POA: Diagnosis not present

## 2016-03-03 DIAGNOSIS — M6281 Muscle weakness (generalized): Secondary | ICD-10-CM | POA: Diagnosis not present

## 2016-03-09 DIAGNOSIS — M0589 Other rheumatoid arthritis with rheumatoid factor of multiple sites: Secondary | ICD-10-CM | POA: Diagnosis not present

## 2016-03-09 DIAGNOSIS — M150 Primary generalized (osteo)arthritis: Secondary | ICD-10-CM | POA: Diagnosis not present

## 2016-03-09 DIAGNOSIS — E271 Primary adrenocortical insufficiency: Secondary | ICD-10-CM | POA: Diagnosis not present

## 2016-03-09 DIAGNOSIS — M810 Age-related osteoporosis without current pathological fracture: Secondary | ICD-10-CM | POA: Diagnosis not present

## 2016-04-28 DIAGNOSIS — Z471 Aftercare following joint replacement surgery: Secondary | ICD-10-CM | POA: Diagnosis not present

## 2016-04-28 DIAGNOSIS — Z96642 Presence of left artificial hip joint: Secondary | ICD-10-CM | POA: Diagnosis not present

## 2016-05-03 DIAGNOSIS — Z125 Encounter for screening for malignant neoplasm of prostate: Secondary | ICD-10-CM | POA: Diagnosis not present

## 2016-05-03 DIAGNOSIS — E784 Other hyperlipidemia: Secondary | ICD-10-CM | POA: Diagnosis not present

## 2016-05-03 DIAGNOSIS — M810 Age-related osteoporosis without current pathological fracture: Secondary | ICD-10-CM | POA: Diagnosis not present

## 2016-05-03 DIAGNOSIS — I10 Essential (primary) hypertension: Secondary | ICD-10-CM | POA: Diagnosis not present

## 2016-05-23 DIAGNOSIS — D848 Other specified immunodeficiencies: Secondary | ICD-10-CM | POA: Diagnosis not present

## 2016-05-23 DIAGNOSIS — I10 Essential (primary) hypertension: Secondary | ICD-10-CM | POA: Diagnosis not present

## 2016-05-23 DIAGNOSIS — M25562 Pain in left knee: Secondary | ICD-10-CM | POA: Diagnosis not present

## 2016-05-23 DIAGNOSIS — E2740 Unspecified adrenocortical insufficiency: Secondary | ICD-10-CM | POA: Diagnosis not present

## 2016-05-23 DIAGNOSIS — Z0000 Encounter for general adult medical examination without abnormal findings: Secondary | ICD-10-CM | POA: Diagnosis not present

## 2016-05-23 DIAGNOSIS — M069 Rheumatoid arthritis, unspecified: Secondary | ICD-10-CM | POA: Diagnosis not present

## 2016-05-23 DIAGNOSIS — E784 Other hyperlipidemia: Secondary | ICD-10-CM | POA: Diagnosis not present

## 2016-05-23 DIAGNOSIS — K219 Gastro-esophageal reflux disease without esophagitis: Secondary | ICD-10-CM | POA: Diagnosis not present

## 2016-05-23 DIAGNOSIS — J189 Pneumonia, unspecified organism: Secondary | ICD-10-CM | POA: Diagnosis not present

## 2016-05-23 DIAGNOSIS — R600 Localized edema: Secondary | ICD-10-CM | POA: Diagnosis not present

## 2016-05-23 DIAGNOSIS — M810 Age-related osteoporosis without current pathological fracture: Secondary | ICD-10-CM | POA: Diagnosis not present

## 2016-05-23 DIAGNOSIS — Z23 Encounter for immunization: Secondary | ICD-10-CM | POA: Diagnosis not present

## 2016-05-31 DIAGNOSIS — Z1212 Encounter for screening for malignant neoplasm of rectum: Secondary | ICD-10-CM | POA: Diagnosis not present

## 2016-06-20 DIAGNOSIS — R509 Fever, unspecified: Secondary | ICD-10-CM | POA: Diagnosis not present

## 2016-06-20 DIAGNOSIS — J1189 Influenza due to unidentified influenza virus with other manifestations: Secondary | ICD-10-CM | POA: Diagnosis not present

## 2016-07-18 DIAGNOSIS — Z6823 Body mass index (BMI) 23.0-23.9, adult: Secondary | ICD-10-CM | POA: Diagnosis not present

## 2016-07-18 DIAGNOSIS — I10 Essential (primary) hypertension: Secondary | ICD-10-CM | POA: Diagnosis not present

## 2016-07-18 DIAGNOSIS — E2740 Unspecified adrenocortical insufficiency: Secondary | ICD-10-CM | POA: Diagnosis not present

## 2016-07-18 DIAGNOSIS — J309 Allergic rhinitis, unspecified: Secondary | ICD-10-CM | POA: Diagnosis not present

## 2016-07-18 DIAGNOSIS — R05 Cough: Secondary | ICD-10-CM | POA: Diagnosis not present

## 2016-09-06 DIAGNOSIS — M150 Primary generalized (osteo)arthritis: Secondary | ICD-10-CM | POA: Diagnosis not present

## 2016-09-06 DIAGNOSIS — E271 Primary adrenocortical insufficiency: Secondary | ICD-10-CM | POA: Diagnosis not present

## 2016-09-06 DIAGNOSIS — M0589 Other rheumatoid arthritis with rheumatoid factor of multiple sites: Secondary | ICD-10-CM | POA: Diagnosis not present

## 2016-09-06 DIAGNOSIS — Z6824 Body mass index (BMI) 24.0-24.9, adult: Secondary | ICD-10-CM | POA: Diagnosis not present

## 2016-09-06 DIAGNOSIS — G894 Chronic pain syndrome: Secondary | ICD-10-CM | POA: Diagnosis not present

## 2016-09-06 DIAGNOSIS — M810 Age-related osteoporosis without current pathological fracture: Secondary | ICD-10-CM | POA: Diagnosis not present

## 2016-09-22 DIAGNOSIS — R509 Fever, unspecified: Secondary | ICD-10-CM | POA: Diagnosis not present

## 2016-09-22 DIAGNOSIS — J069 Acute upper respiratory infection, unspecified: Secondary | ICD-10-CM | POA: Diagnosis not present

## 2016-09-22 DIAGNOSIS — Z6823 Body mass index (BMI) 23.0-23.9, adult: Secondary | ICD-10-CM | POA: Diagnosis not present

## 2016-09-25 ENCOUNTER — Encounter: Payer: Self-pay | Admitting: Podiatry

## 2016-09-25 ENCOUNTER — Ambulatory Visit: Payer: Self-pay | Admitting: Podiatry

## 2016-09-25 DIAGNOSIS — M216X9 Other acquired deformities of unspecified foot: Secondary | ICD-10-CM

## 2016-09-25 DIAGNOSIS — M779 Enthesopathy, unspecified: Secondary | ICD-10-CM

## 2016-09-25 MED ORDER — TRIAMCINOLONE ACETONIDE 10 MG/ML IJ SUSP
10.0000 mg | Freq: Once | INTRAMUSCULAR | Status: AC
  Administered 2016-09-25: 10 mg

## 2016-09-25 NOTE — Progress Notes (Signed)
Subjective:    Patient ID: John Lyons, male   DOB: 71 y.o.   MRN: [id number]   HPI patient presents with pain on the dorsal lateral aspect of the right foot inflammation with fluid around the fifth MPJ right and chronic pain in the feet secondary to bone exposure    ROS      Objective:  Physical Exam no change neurovascular status with patient noted to have prominent metatarsals bilateral with inflammation tendinitis dorsal lateral aspect right foot and inflammation fluid buildup around the fifth MPJ right     Assessment:    Capsulitis of the fifth MPJ right with pain along with tendinitis dorsal lateral aspect right foot and also noted to have prominent metatarsal secondary to diminished fat pad     Plan:    H&P condition reviewed and dorsal lateral tendon injection administered 3 mg Kenalog 5 mg Xylocaine and capsular incision and in [place] administered 3 mg [place] some Kenalog 5 mill grams Xylocaine and orthotics were screened today for long-term usage secondary to significant bone exposure

## 2016-10-03 DIAGNOSIS — M810 Age-related osteoporosis without current pathological fracture: Secondary | ICD-10-CM | POA: Diagnosis not present

## 2016-10-19 ENCOUNTER — Encounter: Payer: Medicare Other | Admitting: Orthotics

## 2016-10-24 DIAGNOSIS — Z961 Presence of intraocular lens: Secondary | ICD-10-CM | POA: Diagnosis not present

## 2016-10-24 DIAGNOSIS — Z79899 Other long term (current) drug therapy: Secondary | ICD-10-CM | POA: Diagnosis not present

## 2016-10-24 DIAGNOSIS — H52203 Unspecified astigmatism, bilateral: Secondary | ICD-10-CM | POA: Diagnosis not present

## 2016-11-21 DIAGNOSIS — M069 Rheumatoid arthritis, unspecified: Secondary | ICD-10-CM | POA: Diagnosis not present

## 2016-11-21 DIAGNOSIS — M810 Age-related osteoporosis without current pathological fracture: Secondary | ICD-10-CM | POA: Diagnosis not present

## 2016-11-21 DIAGNOSIS — E2740 Unspecified adrenocortical insufficiency: Secondary | ICD-10-CM | POA: Diagnosis not present

## 2016-11-21 DIAGNOSIS — R000 Tachycardia, unspecified: Secondary | ICD-10-CM | POA: Diagnosis not present

## 2016-11-21 DIAGNOSIS — Z6823 Body mass index (BMI) 23.0-23.9, adult: Secondary | ICD-10-CM | POA: Diagnosis not present

## 2016-11-21 DIAGNOSIS — I10 Essential (primary) hypertension: Secondary | ICD-10-CM | POA: Diagnosis not present

## 2016-11-21 DIAGNOSIS — D509 Iron deficiency anemia, unspecified: Secondary | ICD-10-CM | POA: Diagnosis not present

## 2016-11-21 DIAGNOSIS — Z0000 Encounter for general adult medical examination without abnormal findings: Secondary | ICD-10-CM | POA: Diagnosis not present

## 2016-12-13 DIAGNOSIS — M810 Age-related osteoporosis without current pathological fracture: Secondary | ICD-10-CM | POA: Diagnosis not present

## 2017-01-27 DIAGNOSIS — Z23 Encounter for immunization: Secondary | ICD-10-CM | POA: Diagnosis not present

## 2017-03-06 DIAGNOSIS — M150 Primary generalized (osteo)arthritis: Secondary | ICD-10-CM | POA: Diagnosis not present

## 2017-03-06 DIAGNOSIS — E271 Primary adrenocortical insufficiency: Secondary | ICD-10-CM | POA: Diagnosis not present

## 2017-03-06 DIAGNOSIS — M810 Age-related osteoporosis without current pathological fracture: Secondary | ICD-10-CM | POA: Diagnosis not present

## 2017-03-06 DIAGNOSIS — R5383 Other fatigue: Secondary | ICD-10-CM | POA: Diagnosis not present

## 2017-03-06 DIAGNOSIS — Z6824 Body mass index (BMI) 24.0-24.9, adult: Secondary | ICD-10-CM | POA: Diagnosis not present

## 2017-03-06 DIAGNOSIS — M0589 Other rheumatoid arthritis with rheumatoid factor of multiple sites: Secondary | ICD-10-CM | POA: Diagnosis not present

## 2017-03-06 DIAGNOSIS — G894 Chronic pain syndrome: Secondary | ICD-10-CM | POA: Diagnosis not present

## 2017-03-21 DIAGNOSIS — Z1389 Encounter for screening for other disorder: Secondary | ICD-10-CM | POA: Diagnosis not present

## 2017-03-21 DIAGNOSIS — E2740 Unspecified adrenocortical insufficiency: Secondary | ICD-10-CM | POA: Diagnosis not present

## 2017-03-21 DIAGNOSIS — M810 Age-related osteoporosis without current pathological fracture: Secondary | ICD-10-CM | POA: Diagnosis not present

## 2017-03-21 DIAGNOSIS — I10 Essential (primary) hypertension: Secondary | ICD-10-CM | POA: Diagnosis not present

## 2017-03-21 DIAGNOSIS — J069 Acute upper respiratory infection, unspecified: Secondary | ICD-10-CM | POA: Diagnosis not present

## 2017-03-21 DIAGNOSIS — Z6823 Body mass index (BMI) 23.0-23.9, adult: Secondary | ICD-10-CM | POA: Diagnosis not present

## 2017-03-21 DIAGNOSIS — M069 Rheumatoid arthritis, unspecified: Secondary | ICD-10-CM | POA: Diagnosis not present

## 2017-04-11 DIAGNOSIS — M0589 Other rheumatoid arthritis with rheumatoid factor of multiple sites: Secondary | ICD-10-CM | POA: Diagnosis not present

## 2017-04-11 DIAGNOSIS — Z79899 Other long term (current) drug therapy: Secondary | ICD-10-CM | POA: Diagnosis not present

## 2017-05-03 ENCOUNTER — Other Ambulatory Visit (HOSPITAL_COMMUNITY): Payer: Self-pay | Admitting: Internal Medicine

## 2017-05-03 DIAGNOSIS — R0789 Other chest pain: Secondary | ICD-10-CM | POA: Diagnosis not present

## 2017-05-03 DIAGNOSIS — J189 Pneumonia, unspecified organism: Secondary | ICD-10-CM | POA: Diagnosis not present

## 2017-05-03 DIAGNOSIS — R600 Localized edema: Secondary | ICD-10-CM | POA: Diagnosis not present

## 2017-05-03 DIAGNOSIS — R609 Edema, unspecified: Secondary | ICD-10-CM

## 2017-05-03 DIAGNOSIS — I10 Essential (primary) hypertension: Secondary | ICD-10-CM | POA: Diagnosis not present

## 2017-05-03 DIAGNOSIS — Z6824 Body mass index (BMI) 24.0-24.9, adult: Secondary | ICD-10-CM | POA: Diagnosis not present

## 2017-05-03 DIAGNOSIS — R3915 Urgency of urination: Secondary | ICD-10-CM | POA: Diagnosis not present

## 2017-05-04 ENCOUNTER — Ambulatory Visit (HOSPITAL_COMMUNITY)
Admission: RE | Admit: 2017-05-04 | Discharge: 2017-05-04 | Disposition: A | Discharge status: Home / Self Care | Payer: Medicare Other | Source: Ambulatory Visit | Attending: Internal Medicine | Admitting: Internal Medicine

## 2017-05-04 DIAGNOSIS — R609 Edema, unspecified: Secondary | ICD-10-CM | POA: Diagnosis not present

## 2017-05-04 DIAGNOSIS — R600 Localized edema: Secondary | ICD-10-CM | POA: Diagnosis not present

## 2017-05-04 NOTE — Progress Notes (Signed)
Left lower extremity venous duplex has been completed. Negative for DVT. Results were faxed to Dr. Silvestre Mesi office.  05/04/17 3:50 PM John Lyons RVT

## 2017-05-09 DIAGNOSIS — M0589 Other rheumatoid arthritis with rheumatoid factor of multiple sites: Secondary | ICD-10-CM | POA: Diagnosis not present

## 2017-06-05 DIAGNOSIS — R05 Cough: Secondary | ICD-10-CM | POA: Diagnosis not present

## 2017-06-26 DIAGNOSIS — Z125 Encounter for screening for malignant neoplasm of prostate: Secondary | ICD-10-CM | POA: Diagnosis not present

## 2017-06-26 DIAGNOSIS — I10 Essential (primary) hypertension: Secondary | ICD-10-CM | POA: Diagnosis not present

## 2017-06-26 DIAGNOSIS — R82998 Other abnormal findings in urine: Secondary | ICD-10-CM | POA: Diagnosis not present

## 2017-06-26 DIAGNOSIS — M810 Age-related osteoporosis without current pathological fracture: Secondary | ICD-10-CM | POA: Diagnosis not present

## 2017-07-02 IMAGING — CT CT CTA ABD/PEL W/CM AND/OR W/O CM
2 of 5 series · 15 of 42 positions shown, 17 images · IV contrast (isovue)
Comparison: None

ADDENDUM:
Additionally noted on the exam were small lung nodules. If the
patient is at high risk for bronchogenic carcinoma, follow-up chest
CT at 1 year is recommended. If the patient is at low risk, no
follow-up is needed. This recommendation follows the consensus
statement: Guidelines for Management of Small Pulmonary Nodules
Detected on CT Scans: A Statement from the [HOSPITAL] as
published in Radiology 0339; [DATE].
CLINICAL DATA: Failed left hip replacement with pain, possible
arterial injury

EXAM:
CTA ABDOMEN AND PELVIS wITHOUT AND WITH CONTRAST
TECHNIQUE: Multidetector CT imaging of the abdomen and pelvis was performed
using the standard protocol during bolus administration of
intravenous contrast. Multiplanar reconstructed images and MIPs were
obtained and reviewed to evaluate the vascular anatomy.
CONTRAST:  80 mL Isovue 370.

[Series 6: angio 3.0 (person_name)31(person_name) · axial · 0.63mm/px · z∈[-426,-33]mm · 12 of 145 slices shown, 14 images]
[im 7/145  soft-tissue]
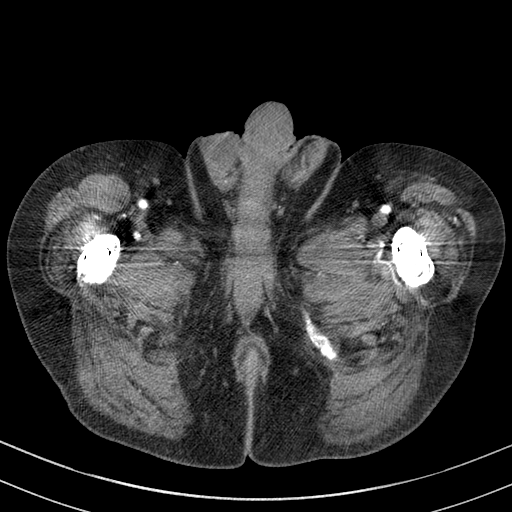
[im 7/145  bone]
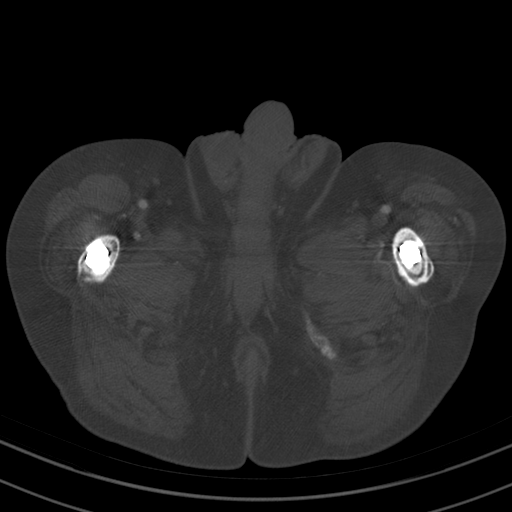
[im 20/145  soft-tissue]
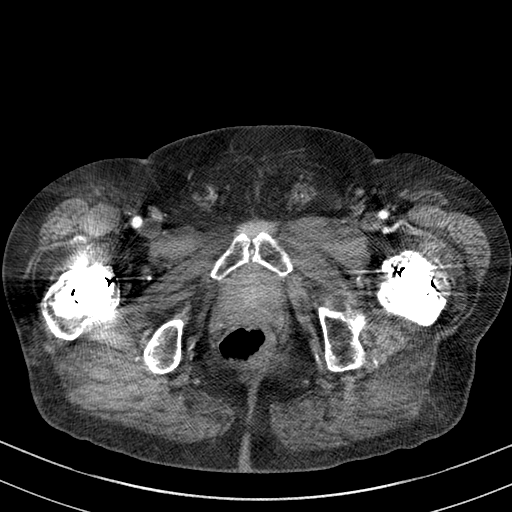
[im 33/145  soft-tissue]
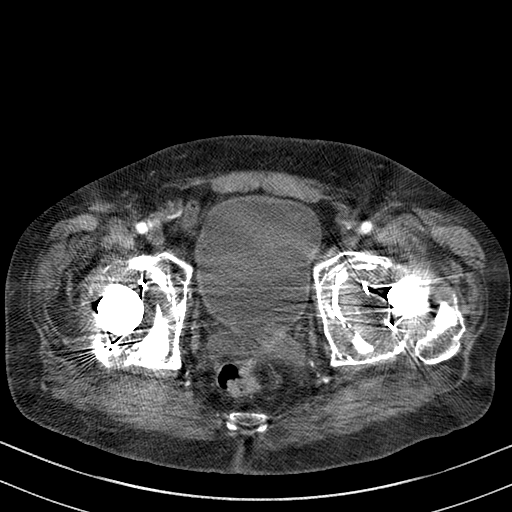
[im 46/145  soft-tissue]
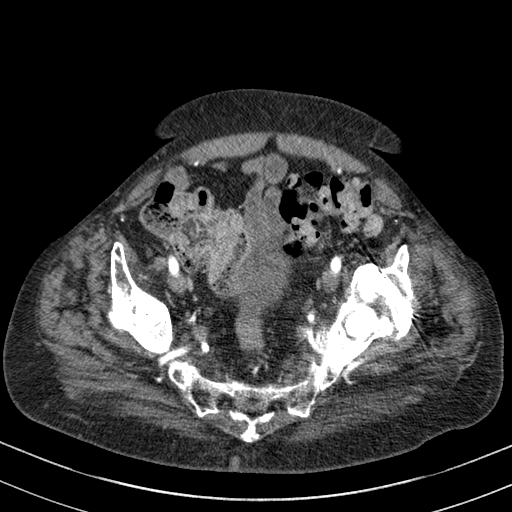
[im 53/145  soft-tissue]
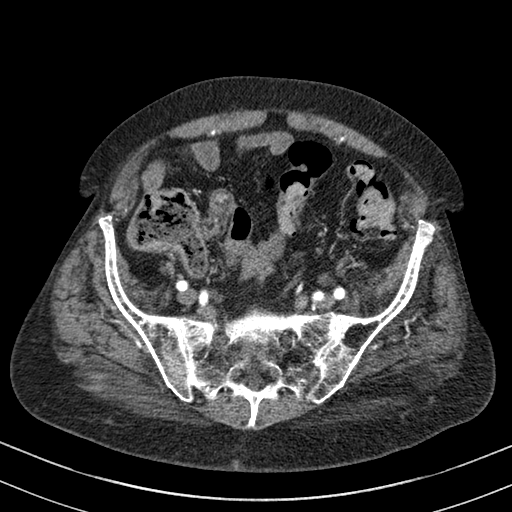
[im 66/145  soft-tissue]
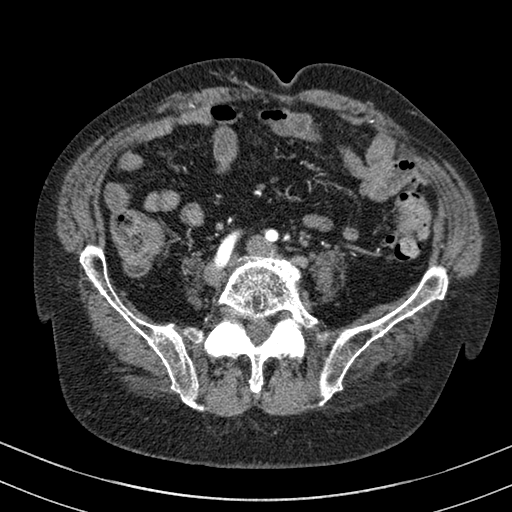
[im 79/145  soft-tissue]
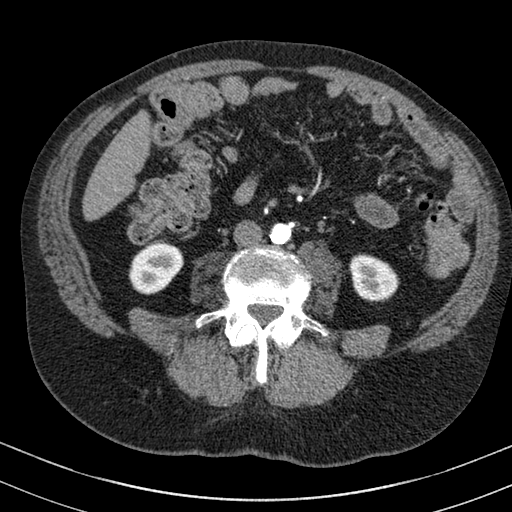
[im 92/145  soft-tissue]
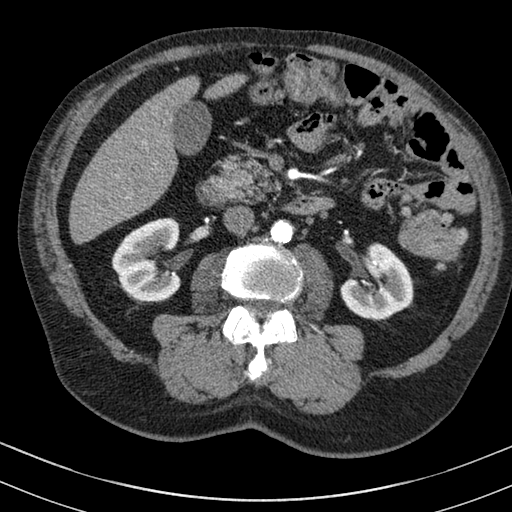
[im 99/145  soft-tissue]
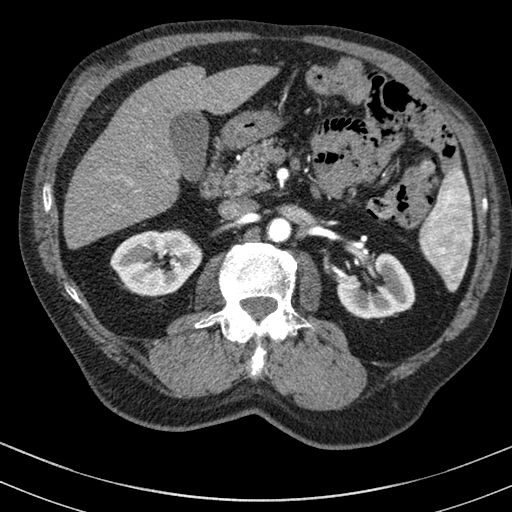
[im 99/145  bone]
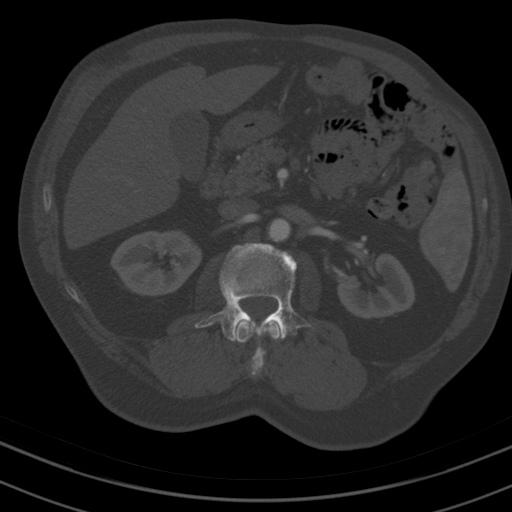
[im 112/145  soft-tissue]
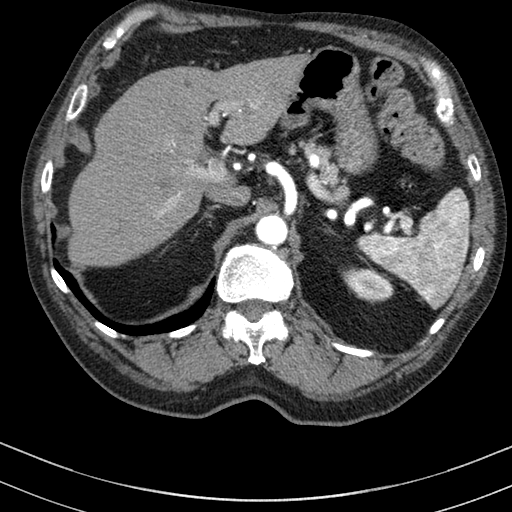
[im 125/145  soft-tissue]
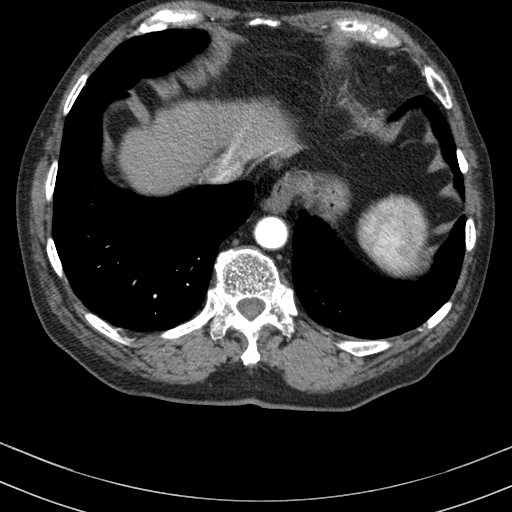
[im 138/145  soft-tissue]
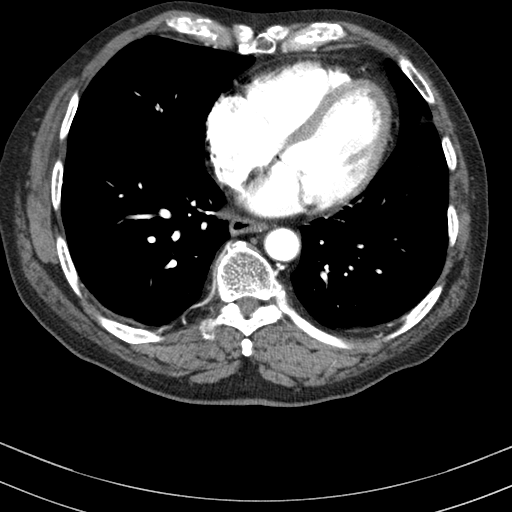

[Series 602: cor · coronal · 0.85mm/px · 3 of 94 slices shown]
[im 24/94  soft-tissue]
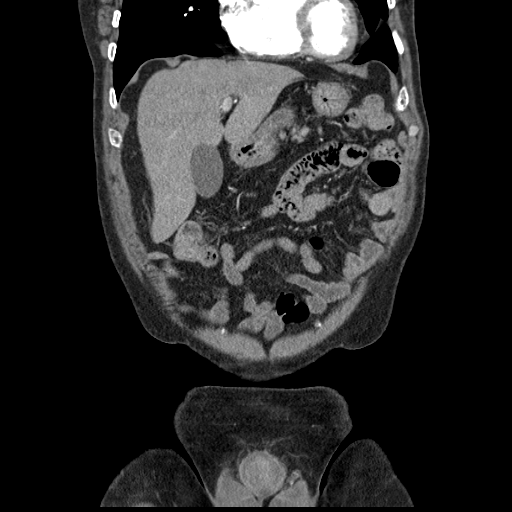
[im 47/94  soft-tissue]
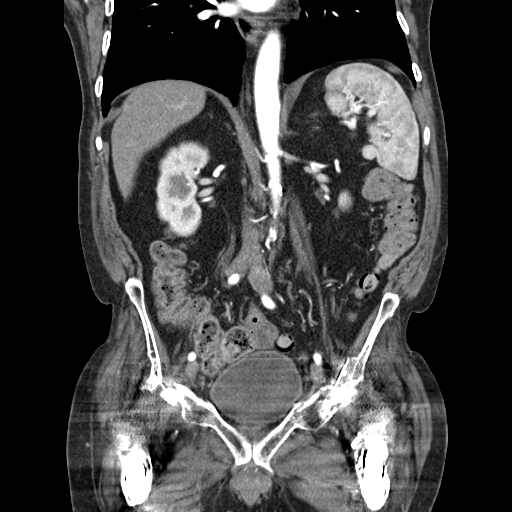
[im 70/94  soft-tissue]
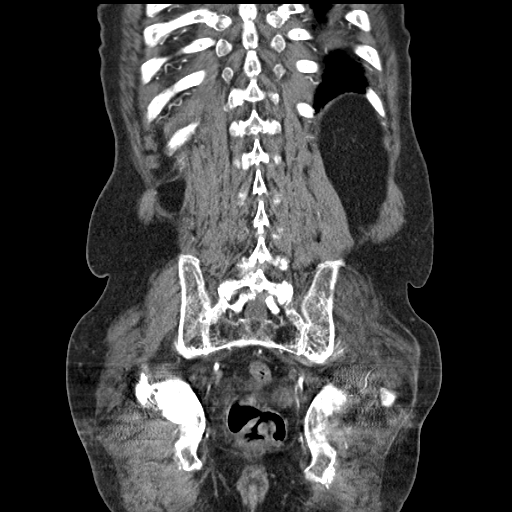

[15 of 42 positions shown; findings below may reference images not displayed]

FINDINGS: Lung bases are free of acute infiltrate or sizable effusion. A few
tiny subpleural lung nodules are noted which measure approximately 4
mm in greatest dimension. The largest of these is seen on image
number 18 of series 8. Cardiac structures are within normal limits.
No definitive pulmonary embolism is seen.

The liver is diffusely fatty infiltrated. A few tiny hepatic cysts
are seen. Multiple gallstones are seen without complicating factors.
The spleen, adrenal glands and pancreas are within normal limits.
The kidneys are well visualized and within normal limits. No
obstructive changes are seen. The bladder is well distended without
intraluminal filling defect. The prostate is mildly enlarged.

Scattered diverticular change in the colon is noted. No
diverticulitis is seen. The appendix is not well visualized. No
inflammatory changes are noted.

The osseous structures show degenerative change of the lumbar spine.
Bilateral hip replacements are noted. No dislocation is identified.
Significant osteolysis is again identified surrounding the
acetabular components. This is been recently described on CT of the
pelvis. Osteopenia is noted within the residual native pelvic bones.

The abdominal aorta demonstrates a normal branching pattern with
patency of the visceral vessels. Mild calcific changes are noted at
the origins of the celiac axis and superior mesenteric artery
although no focal stenosis is noted. No aneurysmal dilatation is
seen of the aorta. Mild calcifications are noted the origins of the
renal arteries bilaterally without focal stenosis. Single renal
arteries are identified bilaterally.

The iliac arteries are within normal limits. The common femoral
arteries and femoral bifurcations appear patent. No active
extravasation is identified to suggest hemorrhage. No abnormality of
the arterial tree is noted related to the hip prostheses and
associated changes.

Review of the MIP images confirms the above findings.
IMPRESSION: No evidence of arterial injury secondary to the changes related to
the hip prostheses.

Chronic changes in the hip prostheses similar to that noted on prior
CT.

Cholelithiasis and fatty infiltration of the liver.

Diverticulosis without diverticulitis.

Mild atherosclerotic changes without acute abnormality.

## 2017-07-03 DIAGNOSIS — E2740 Unspecified adrenocortical insufficiency: Secondary | ICD-10-CM | POA: Diagnosis not present

## 2017-07-03 DIAGNOSIS — R600 Localized edema: Secondary | ICD-10-CM | POA: Diagnosis not present

## 2017-07-03 DIAGNOSIS — D849 Immunodeficiency, unspecified: Secondary | ICD-10-CM | POA: Diagnosis not present

## 2017-07-03 DIAGNOSIS — R3915 Urgency of urination: Secondary | ICD-10-CM | POA: Diagnosis not present

## 2017-07-03 DIAGNOSIS — Z1389 Encounter for screening for other disorder: Secondary | ICD-10-CM | POA: Diagnosis not present

## 2017-07-03 DIAGNOSIS — K5792 Diverticulitis of intestine, part unspecified, without perforation or abscess without bleeding: Secondary | ICD-10-CM | POA: Diagnosis not present

## 2017-07-03 DIAGNOSIS — Z0000 Encounter for general adult medical examination without abnormal findings: Secondary | ICD-10-CM | POA: Diagnosis not present

## 2017-07-03 DIAGNOSIS — Z9049 Acquired absence of other specified parts of digestive tract: Secondary | ICD-10-CM | POA: Diagnosis not present

## 2017-07-03 DIAGNOSIS — Z6823 Body mass index (BMI) 23.0-23.9, adult: Secondary | ICD-10-CM | POA: Diagnosis not present

## 2017-07-03 DIAGNOSIS — M25562 Pain in left knee: Secondary | ICD-10-CM | POA: Diagnosis not present

## 2017-07-03 DIAGNOSIS — M810 Age-related osteoporosis without current pathological fracture: Secondary | ICD-10-CM | POA: Diagnosis not present

## 2017-07-03 DIAGNOSIS — M069 Rheumatoid arthritis, unspecified: Secondary | ICD-10-CM | POA: Diagnosis not present

## 2017-07-04 DIAGNOSIS — M0589 Other rheumatoid arthritis with rheumatoid factor of multiple sites: Secondary | ICD-10-CM | POA: Diagnosis not present

## 2017-07-11 DIAGNOSIS — Z1212 Encounter for screening for malignant neoplasm of rectum: Secondary | ICD-10-CM | POA: Diagnosis not present

## 2017-08-29 DIAGNOSIS — M0589 Other rheumatoid arthritis with rheumatoid factor of multiple sites: Secondary | ICD-10-CM | POA: Diagnosis not present

## 2017-10-03 DIAGNOSIS — M810 Age-related osteoporosis without current pathological fracture: Secondary | ICD-10-CM | POA: Diagnosis not present

## 2017-10-04 DIAGNOSIS — M810 Age-related osteoporosis without current pathological fracture: Secondary | ICD-10-CM | POA: Diagnosis not present

## 2017-10-05 DIAGNOSIS — J329 Chronic sinusitis, unspecified: Secondary | ICD-10-CM | POA: Diagnosis not present

## 2017-10-05 DIAGNOSIS — Z6823 Body mass index (BMI) 23.0-23.9, adult: Secondary | ICD-10-CM | POA: Diagnosis not present

## 2017-10-18 DIAGNOSIS — R5383 Other fatigue: Secondary | ICD-10-CM | POA: Diagnosis not present

## 2017-10-18 DIAGNOSIS — M150 Primary generalized (osteo)arthritis: Secondary | ICD-10-CM | POA: Diagnosis not present

## 2017-10-18 DIAGNOSIS — M810 Age-related osteoporosis without current pathological fracture: Secondary | ICD-10-CM | POA: Diagnosis not present

## 2017-10-18 DIAGNOSIS — M0589 Other rheumatoid arthritis with rheumatoid factor of multiple sites: Secondary | ICD-10-CM | POA: Diagnosis not present

## 2017-10-18 DIAGNOSIS — E271 Primary adrenocortical insufficiency: Secondary | ICD-10-CM | POA: Diagnosis not present

## 2017-10-18 DIAGNOSIS — Z6823 Body mass index (BMI) 23.0-23.9, adult: Secondary | ICD-10-CM | POA: Diagnosis not present

## 2017-10-18 DIAGNOSIS — G894 Chronic pain syndrome: Secondary | ICD-10-CM | POA: Diagnosis not present

## 2017-10-31 DIAGNOSIS — J0101 Acute recurrent maxillary sinusitis: Secondary | ICD-10-CM | POA: Diagnosis not present

## 2017-10-31 DIAGNOSIS — J343 Hypertrophy of nasal turbinates: Secondary | ICD-10-CM | POA: Diagnosis not present

## 2017-10-31 DIAGNOSIS — J310 Chronic rhinitis: Secondary | ICD-10-CM | POA: Diagnosis not present

## 2017-11-05 ENCOUNTER — Other Ambulatory Visit (INDEPENDENT_AMBULATORY_CARE_PROVIDER_SITE_OTHER): Payer: Self-pay | Admitting: Otolaryngology

## 2017-11-05 DIAGNOSIS — J329 Chronic sinusitis, unspecified: Secondary | ICD-10-CM

## 2017-11-14 ENCOUNTER — Ambulatory Visit
Admission: RE | Admit: 2017-11-14 | Discharge: 2017-11-14 | Disposition: A | Discharge status: Home / Self Care | Payer: Medicare Other | Source: Ambulatory Visit | Attending: Otolaryngology | Admitting: Otolaryngology

## 2017-11-14 DIAGNOSIS — J322 Chronic ethmoidal sinusitis: Secondary | ICD-10-CM | POA: Diagnosis not present

## 2017-11-14 DIAGNOSIS — J329 Chronic sinusitis, unspecified: Secondary | ICD-10-CM

## 2017-11-16 DIAGNOSIS — Z79899 Other long term (current) drug therapy: Secondary | ICD-10-CM | POA: Diagnosis not present

## 2017-11-16 DIAGNOSIS — H524 Presbyopia: Secondary | ICD-10-CM | POA: Diagnosis not present

## 2017-11-16 DIAGNOSIS — Z961 Presence of intraocular lens: Secondary | ICD-10-CM | POA: Diagnosis not present

## 2017-11-20 DIAGNOSIS — J322 Chronic ethmoidal sinusitis: Secondary | ICD-10-CM | POA: Diagnosis not present

## 2017-11-20 DIAGNOSIS — J343 Hypertrophy of nasal turbinates: Secondary | ICD-10-CM | POA: Diagnosis not present

## 2017-11-20 DIAGNOSIS — J320 Chronic maxillary sinusitis: Secondary | ICD-10-CM | POA: Diagnosis not present

## 2017-11-23 DIAGNOSIS — Z79899 Other long term (current) drug therapy: Secondary | ICD-10-CM | POA: Diagnosis not present

## 2017-12-05 ENCOUNTER — Other Ambulatory Visit: Payer: Self-pay | Admitting: Otolaryngology

## 2017-12-26 ENCOUNTER — Other Ambulatory Visit: Payer: Self-pay

## 2017-12-26 ENCOUNTER — Encounter (HOSPITAL_BASED_OUTPATIENT_CLINIC_OR_DEPARTMENT_OTHER): Payer: Self-pay | Admitting: *Deleted

## 2018-01-01 ENCOUNTER — Ambulatory Visit (HOSPITAL_BASED_OUTPATIENT_CLINIC_OR_DEPARTMENT_OTHER): Payer: Medicare Other | Admitting: Anesthesiology

## 2018-01-01 ENCOUNTER — Encounter (HOSPITAL_BASED_OUTPATIENT_CLINIC_OR_DEPARTMENT_OTHER): Payer: Self-pay | Admitting: *Deleted

## 2018-01-01 ENCOUNTER — Encounter (HOSPITAL_BASED_OUTPATIENT_CLINIC_OR_DEPARTMENT_OTHER)
Admission: RE | Disposition: A | Discharge status: Home / Self Care | Payer: Self-pay | Source: Ambulatory Visit | Attending: Otolaryngology

## 2018-01-01 ENCOUNTER — Ambulatory Visit (HOSPITAL_BASED_OUTPATIENT_CLINIC_OR_DEPARTMENT_OTHER)
Admission: RE | Admit: 2018-01-01 | Discharge: 2018-01-01 | Disposition: A | Discharge status: Home / Self Care | Payer: Medicare Other | Source: Ambulatory Visit | Attending: Otolaryngology | Admitting: Otolaryngology

## 2018-01-01 DIAGNOSIS — E271 Primary adrenocortical insufficiency: Secondary | ICD-10-CM | POA: Insufficient documentation

## 2018-01-01 DIAGNOSIS — F419 Anxiety disorder, unspecified: Secondary | ICD-10-CM | POA: Diagnosis not present

## 2018-01-01 DIAGNOSIS — J320 Chronic maxillary sinusitis: Secondary | ICD-10-CM | POA: Insufficient documentation

## 2018-01-01 DIAGNOSIS — J338 Other polyp of sinus: Secondary | ICD-10-CM | POA: Diagnosis not present

## 2018-01-01 DIAGNOSIS — I10 Essential (primary) hypertension: Secondary | ICD-10-CM | POA: Insufficient documentation

## 2018-01-01 DIAGNOSIS — J343 Hypertrophy of nasal turbinates: Secondary | ICD-10-CM | POA: Insufficient documentation

## 2018-01-01 DIAGNOSIS — J322 Chronic ethmoidal sinusitis: Secondary | ICD-10-CM | POA: Diagnosis not present

## 2018-01-01 DIAGNOSIS — J3489 Other specified disorders of nose and nasal sinuses: Secondary | ICD-10-CM | POA: Insufficient documentation

## 2018-01-01 DIAGNOSIS — J0120 Acute ethmoidal sinusitis, unspecified: Secondary | ICD-10-CM | POA: Diagnosis not present

## 2018-01-01 DIAGNOSIS — J329 Chronic sinusitis, unspecified: Secondary | ICD-10-CM | POA: Diagnosis not present

## 2018-01-01 DIAGNOSIS — K219 Gastro-esophageal reflux disease without esophagitis: Secondary | ICD-10-CM | POA: Insufficient documentation

## 2018-01-01 DIAGNOSIS — J0100 Acute maxillary sinusitis, unspecified: Secondary | ICD-10-CM | POA: Diagnosis not present

## 2018-01-01 DIAGNOSIS — J019 Acute sinusitis, unspecified: Secondary | ICD-10-CM | POA: Diagnosis not present

## 2018-01-01 HISTORY — PX: SINUS ENDO WITH FUSION: SHX5329

## 2018-01-01 HISTORY — PX: TURBINATE REDUCTION: SHX6157

## 2018-01-01 SURGERY — SURGERY, PARANASAL SINUS, ENDOSCOPIC, WITH NASAL SEPTOPLASTY, TURBINOPLASTY, AND MAXILLARY SINUSOTOMY
Anesthesia: General | Site: Nose | Laterality: Bilateral

## 2018-01-01 MED ORDER — ONDANSETRON HCL 4 MG/2ML IJ SOLN: 4.0000 mg | Freq: Once | INTRAMUSCULAR | Status: DC | PRN

## 2018-01-01 MED ORDER — CIPROFLOXACIN-DEXAMETHASONE 0.3-0.1 % OT SUSP
OTIC | Status: AC
  Filled 2018-01-01: qty 7.5

## 2018-01-01 MED ORDER — FENTANYL CITRATE (PF) 100 MCG/2ML IJ SOLN
INTRAMUSCULAR | Status: AC
  Filled 2018-01-01: qty 2

## 2018-01-01 MED ORDER — ONDANSETRON HCL 4 MG/2ML IJ SOLN
INTRAMUSCULAR | Status: AC
  Filled 2018-01-01: qty 2

## 2018-01-01 MED ORDER — SUGAMMADEX SODIUM 200 MG/2ML IV SOLN
INTRAVENOUS | Status: DC | PRN
  Administered 2018-01-01: 200 mg via INTRAVENOUS

## 2018-01-01 MED ORDER — DEXAMETHASONE SODIUM PHOSPHATE 10 MG/ML IJ SOLN
INTRAMUSCULAR | Status: AC
  Filled 2018-01-01: qty 1

## 2018-01-01 MED ORDER — MUPIROCIN 2 % EX OINT
TOPICAL_OINTMENT | CUTANEOUS | Status: DC | PRN
  Administered 2018-01-01: 1 via TOPICAL

## 2018-01-01 MED ORDER — ROCURONIUM BROMIDE 100 MG/10ML IV SOLN
INTRAVENOUS | Status: DC | PRN
  Administered 2018-01-01: 50 mg via INTRAVENOUS

## 2018-01-01 MED ORDER — PROPOFOL 10 MG/ML IV BOLUS
INTRAVENOUS | Status: AC
  Filled 2018-01-01: qty 20

## 2018-01-01 MED ORDER — CEFAZOLIN SODIUM 1 G IJ SOLR
INTRAMUSCULAR | Status: AC
  Filled 2018-01-01: qty 20

## 2018-01-01 MED ORDER — SCOPOLAMINE 1 MG/3DAYS TD PT72: 1.0000 | MEDICATED_PATCH | Freq: Once | TRANSDERMAL | Status: DC | PRN

## 2018-01-01 MED ORDER — HYDROMORPHONE HCL 1 MG/ML IJ SOLN: 0.2500 mg | INTRAMUSCULAR | Status: DC | PRN

## 2018-01-01 MED ORDER — OXYMETAZOLINE HCL 0.05 % NA SOLN
NASAL | Status: DC | PRN
  Administered 2018-01-01: 1 via TOPICAL

## 2018-01-01 MED ORDER — OXYMETAZOLINE HCL 0.05 % NA SOLN
NASAL | Status: AC
  Filled 2018-01-01: qty 45

## 2018-01-01 MED ORDER — DEXAMETHASONE SODIUM PHOSPHATE 4 MG/ML IJ SOLN
INTRAMUSCULAR | Status: DC | PRN
  Administered 2018-01-01: 10 mg via INTRAVENOUS

## 2018-01-01 MED ORDER — LIDOCAINE 2% (20 MG/ML) 5 ML SYRINGE
INTRAMUSCULAR | Status: AC
  Filled 2018-01-01: qty 5

## 2018-01-01 MED ORDER — MEPERIDINE HCL 25 MG/ML IJ SOLN: 6.2500 mg | INTRAMUSCULAR | Status: DC | PRN

## 2018-01-01 MED ORDER — MUPIROCIN 2 % EX OINT
TOPICAL_OINTMENT | CUTANEOUS | Status: AC
  Filled 2018-01-01: qty 22

## 2018-01-01 MED ORDER — OXYCODONE-ACETAMINOPHEN 5-325 MG PO TABS: 1.0000 | ORAL_TABLET | ORAL | 0 refills | Status: DC | PRN

## 2018-01-01 MED ORDER — EPHEDRINE SULFATE 50 MG/ML IJ SOLN
INTRAMUSCULAR | Status: DC | PRN
  Administered 2018-01-01: 10 mg via INTRAVENOUS

## 2018-01-01 MED ORDER — FENTANYL CITRATE (PF) 100 MCG/2ML IJ SOLN
50.0000 ug | INTRAMUSCULAR | Status: AC | PRN
  Administered 2018-01-01: 50 ug via INTRAVENOUS
  Administered 2018-01-01 (×2): 25 ug via INTRAVENOUS
  Administered 2018-01-01: 50 ug via INTRAVENOUS

## 2018-01-01 MED ORDER — CEFAZOLIN SODIUM-DEXTROSE 2-3 GM-%(50ML) IV SOLR
INTRAVENOUS | Status: DC | PRN
  Administered 2018-01-01: 2 g via INTRAVENOUS

## 2018-01-01 MED ORDER — ONDANSETRON HCL 4 MG/2ML IJ SOLN
INTRAMUSCULAR | Status: DC | PRN
  Administered 2018-01-01: 4 mg via INTRAVENOUS

## 2018-01-01 MED ORDER — LIDOCAINE HCL (CARDIAC) PF 100 MG/5ML IV SOSY
PREFILLED_SYRINGE | INTRAVENOUS | Status: DC | PRN
  Administered 2018-01-01: 100 mg via INTRAVENOUS

## 2018-01-01 MED ORDER — PROPOFOL 10 MG/ML IV BOLUS
INTRAVENOUS | Status: DC | PRN
  Administered 2018-01-01: 130 mg via INTRAVENOUS

## 2018-01-01 MED ORDER — AMOXICILLIN-POT CLAVULANATE 875-125 MG PO TABS: 1.0000 | ORAL_TABLET | Freq: Two times a day (BID) | ORAL | 0 refills | Status: AC

## 2018-01-01 MED ORDER — LACTATED RINGERS IV SOLN
INTRAVENOUS | Status: DC
  Administered 2018-01-01 (×2): via INTRAVENOUS

## 2018-01-01 MED ORDER — ROCURONIUM BROMIDE 50 MG/5ML IV SOSY
PREFILLED_SYRINGE | INTRAVENOUS | Status: AC
  Filled 2018-01-01: qty 5

## 2018-01-01 MED ORDER — MIDAZOLAM HCL 2 MG/2ML IJ SOLN: 1.0000 mg | INTRAMUSCULAR | Status: DC | PRN

## 2018-01-01 MED ORDER — LIDOCAINE-EPINEPHRINE 1 %-1:100000 IJ SOLN
INTRAMUSCULAR | Status: AC
  Filled 2018-01-01: qty 1

## 2018-01-01 SURGICAL SUPPLY — 45 items
ATTRACTOMAT 16X20 MAGNETIC DRP (DRAPES) IMPLANT
BLADE ROTATE RAD 12 4 M4 (BLADE) IMPLANT
BLADE ROTATE RAD 40 4 M4 (BLADE) IMPLANT
BLADE ROTATE TRICUT 4X13 M4 (BLADE) ×2 IMPLANT
BLADE TRICUT ROTATE M4 4 5PK (BLADE) IMPLANT
BUR HS RAD FRONTAL 3 (BURR) IMPLANT
CANISTER SUC SOCK COL 7IN (MISCELLANEOUS) ×4 IMPLANT
CANISTER SUCT 1200ML W/VALVE (MISCELLANEOUS) ×2 IMPLANT
COAGULATOR SUCT 8FR VV (MISCELLANEOUS) IMPLANT
DECANTER SPIKE VIAL GLASS SM (MISCELLANEOUS) IMPLANT
DRSG NASAL KENNEDY LMNT 8CM (GAUZE/BANDAGES/DRESSINGS) IMPLANT
DRSG NASOPORE 8CM (GAUZE/BANDAGES/DRESSINGS) ×1 IMPLANT
ELECT REM PT RETURN 9FT ADLT (ELECTROSURGICAL) ×2
ELECTRODE REM PT RTRN 9FT ADLT (ELECTROSURGICAL) ×1 IMPLANT
GLOVE BIO SURGEON STRL SZ7.5 (GLOVE) ×2 IMPLANT
GLOVE BIOGEL PI IND STRL 7.0 (GLOVE) IMPLANT
GLOVE BIOGEL PI INDICATOR 7.0 (GLOVE) ×1
GLOVE ECLIPSE 6.5 STRL STRAW (GLOVE) ×1 IMPLANT
GOWN STRL REUS W/ TWL LRG LVL3 (GOWN DISPOSABLE) ×2 IMPLANT
GOWN STRL REUS W/TWL LRG LVL3 (GOWN DISPOSABLE) ×4
HEMOSTAT SURGICEL 2X14 (HEMOSTASIS) IMPLANT
IV NS 1000ML (IV SOLUTION)
IV NS 1000ML BAXH (IV SOLUTION) IMPLANT
IV NS 500ML (IV SOLUTION) ×2
IV NS 500ML BAXH (IV SOLUTION) ×2 IMPLANT
NDL HYPO 25X1 1.5 SAFETY (NEEDLE) ×1 IMPLANT
NDL SPNL 25GX3.5 QUINCKE BL (NEEDLE) IMPLANT
NEEDLE HYPO 25X1 1.5 SAFETY (NEEDLE) ×2 IMPLANT
NEEDLE SPNL 25GX3.5 QUINCKE BL (NEEDLE) IMPLANT
NS IRRIG 1000ML POUR BTL (IV SOLUTION) ×2 IMPLANT
PACK BASIN DAY SURGERY FS (CUSTOM PROCEDURE TRAY) ×2 IMPLANT
PACK ENT DAY SURGERY (CUSTOM PROCEDURE TRAY) ×2 IMPLANT
PACKING NASAL EPIS 4X2.4 XEROG (MISCELLANEOUS) IMPLANT
PATTIES SURGICAL .5 X3 (DISPOSABLE) IMPLANT
SLEEVE SCD COMPRESS KNEE MED (MISCELLANEOUS) IMPLANT
SOLUTION BUTLER CLEAR DIP (MISCELLANEOUS) ×3 IMPLANT
SPONGE GAUZE 2X2 8PLY STRL LF (GAUZE/BANDAGES/DRESSINGS) ×2 IMPLANT
SPONGE NEURO XRAY DETECT 1X3 (DISPOSABLE) ×2 IMPLANT
TOWEL GREEN STERILE FF (TOWEL DISPOSABLE) ×2 IMPLANT
TRACKER ENT INSTRUMENT (MISCELLANEOUS) ×2 IMPLANT
TRACKER ENT PATIENT (MISCELLANEOUS) ×2 IMPLANT
TUBE CONNECTING 20X1/4 (TUBING) ×2 IMPLANT
TUBE SALEM SUMP 16 FR W/ARV (TUBING) ×1 IMPLANT
TUBING STRAIGHTSHOT EPS 5PK (TUBING) ×2 IMPLANT
YANKAUER SUCT BULB TIP NO VENT (SUCTIONS) ×2 IMPLANT

## 2018-01-01 NOTE — Anesthesia Postprocedure Evaluation (Signed)
Anesthesia Post Note  Patient: John Lyons  Procedure(s) Performed: BILATERAL TURBINATE REDUCTION, BILATERAL TOTAL ENDOSCOPIC ETHOMIDECTOMY AND BILATERAL ENDOSCOPIC MAXILLARY ANTROSTOMY WITH TISSUE REMOVAL WITH FUSION (Bilateral Nose) BILATERAL TURBINATE REDUCTION (Bilateral Nose)     Patient location during evaluation: PACU Anesthesia Type: General Level of consciousness: awake and alert Pain management: pain level controlled Vital Signs Assessment: post-procedure vital signs reviewed and stable Respiratory status: spontaneous breathing, nonlabored ventilation, respiratory function stable and patient connected to nasal cannula oxygen Cardiovascular status: blood pressure returned to baseline and stable Postop Assessment: no apparent nausea or vomiting Anesthetic complications: no    Last Vitals:  Vitals:   01/01/18 1315 01/01/18 1336  BP:  (!) 154/70  Pulse:  85  Resp:  18  Temp:  36.6 C  SpO2: 100% 97%    Last Pain:  Vitals:   01/01/18 1336  TempSrc:   PainSc: 0-No pain                 Tinaya Ceballos DAVID

## 2018-01-01 NOTE — Anesthesia Procedure Notes (Signed)
Procedure Name: Intubation Date/Time: 01/01/2018 11:15 AM Performed by: Lyndee Leo, CRNA Pre-anesthesia Checklist: Patient identified, Emergency Drugs available, Suction available and Patient being monitored Patient Re-evaluated:Patient Re-evaluated prior to induction Oxygen Delivery Method: Circle system utilized Preoxygenation: Pre-oxygenation with 100% oxygen Induction Type: IV induction Ventilation: Mask ventilation without difficulty Laryngoscope Size: Miller and 3 Grade View: Grade III Tube type: Oral Rae Tube size: 8.0 mm Number of attempts: 1 Airway Equipment and Method: Stylet and Oral airway Placement Confirmation: ETT inserted through vocal cords under direct vision,  positive ETCO2 and breath sounds checked- equal and bilateral Secured at: 20 cm Tube secured with: Tape Dental Injury: Teeth and Oropharynx as per pre-operative assessment

## 2018-01-01 NOTE — H&P (Signed)
Cc: Chronic rhinosinusitis, nasal obstruction  HPI: The patient is a 72 year old male who returns today for his follow-up evaluation.  The patient was last seen 1 month ago.  At that time, he was noted to have recurrent acute rhinosinusitis.  Mucopurulent drainage was noted bilaterally. The patient was treated with Levaquin for 10 days.  He was also instructed to continue his Flonase nasal spray and Mucinex.  According to the patient, his symptoms initially improved.  However, over the past 2 weeks, he has noted increasing nasal congestion and purulent nasal drainage.  It should be noted the patient has been treated with 4 courses of antibiotics over the past 3 months.  He was also placed on multiple prednisone dosepaks.  The patient underwent a sinus CT scan this month.  The CT showed mucosal edema of his maxillary and ethmoid sinuses bilaterally.  His ostiomeatal complex is occluded bilaterally due to the mucosal edema.    Exam: The nasal cavities were decongested and anesthetised with a combination of oxymetazoline and 4% lidocaine solution.  The flexible scope was inserted into the right nasal cavity.  Endoscopy of the inferior and middle meatus was performed.  Edematous mucosa was noted.  Mucopurulent drainage was noted.  Nasopharynx was clear.  Turbinates were severely hypertrophied.  Incomplete response to decongestion.  The procedure was repeated on the contralateral side with similar findings.  The patient tolerated the procedure well.  Instructions were given to avoid eating or drinking for 2 hours.    Assessment: 1.  The patient is noted to have persistent chronic rhinosinusitis bilaterally.  Mucopurulent drainage is noted from both nasal cavities.  2.  His recent CT scan showed bilateral chronic maxillary and ethmoid sinusitis.  3.  The patient also has severe bilateral inferior turbinate hypertrophy, nearly completely obstructing his nasal cavities bilaterally.   Plan: 1.  The nasal  endoscopy findings and the CT images are reviewed with the patient.  2.  Levofloxacin 500 mg p.o. daily for 14 days.  3.  In light of his persistent disease, he will benefit from undergoing surgical intervention with bilateral endoscopic sinus surgery and bilateral inferior turbinate reduction.  The risks, benefits, and details of the procedures are reviewed with the patient.  4.  The patient would like to proceed with the procedures.

## 2018-01-01 NOTE — Discharge Instructions (Addendum)

## 2018-01-01 NOTE — Op Note (Signed)
DATE OF PROCEDURE: 01/01/2018  OPERATIVE REPORT   SURGEON: Leta Baptist, MD   PREOPERATIVE DIAGNOSES:  1. Bilateral maxillary and ethmoid sinusitis. 2. Bilateral inferior turbinate hypertrophy.  3. Chronic nasal obstruction.  POSTOPERATIVE DIAGNOSES:  1. Bilateral maxillary and ethmoid sinusitis. 2. Bilateral inferior turbinate hypertrophy.  3. Chronic nasal obstruction.  PROCEDURE PERFORMED:  1. Bilateral endoscopic total ethmoidectomy. 2. Bilateral endoscopic maxillary antrostomy with polyp removal. 3. Bilateral partial inferior turbinate resection.  4. FUSION stereotactic image guidance.  ANESTHESIA: General endotracheal tube anesthesia.   COMPLICATIONS: None.   ESTIMATED BLOOD LOSS: 100 mL.   INDICATION FOR PROCEDURE: John Lyons is a 72 y.o. male with a history of chronic nasal obstruction and bilateral recurrent rhinosinusitis. The patient was treated with multiple courses of antibiotics, antihistamine, decongestant, steroid nasal spray, and systemic steroids. However, the patient continued to be symptomatic. On examination, the patient was noted to have bilateral severe inferior turbinate hypertrophy, causing significant nasal obstruction. His CT scan also showed mucosal edema of his maxillary and ethmoid sinuses bilaterally. His ostiomeatal complex was occluded bilaterally due to the mucosal edema. Based on the above findings, the decision was made for the patient to undergo the above-stated procedures. The risks, benefits, alternatives, and details of the procedures were discussed with the patient. Questions were invited and answered. Informed consent was obtained.   DESCRIPTION OF PROCEDURE: The patient was taken to the operating room and placed supine on the operating table. General endotracheal tube anesthesia was administered by the anesthesiologist. The patient was positioned, and prepped and draped in the standard fashion for nasal surgery. Pledgets soaked with Afrin were  placed in both nasal cavities for decongestion. The pledgets were subsequently removed.   The FUSION stereotactic image guidance marker was placed. The image guidance system was functional throughout the case. Attention was first focused on the left nasal cavity. The left middle turbinate was carefully medialized. The inferior portion of the middle turbinate was partially resected to improve visualization of the patient's ethmoid and maxillary cavities. The uncinate process was resected with a freer elevator. Mucopurulent drainage was noted from the maxillary and ethmoid cavities. The bony partitions of the anterior and posterior ethmoid cavities were removed using a combination of Tru-Cut forceps and microdebrider. Polypoid tissue was removed. The maxillary antrum was then entered and enlarged using a combination of backbiting forceps and microdebrider. Polypoid tissue was removed. Both sinuses were copiously irrigated with saline solution. The same procedure was repeated on the right side without exceptions.  Attention was then focused on the inferior turbinates. The inferior one half of both hypertrophied inferior turbinate was crossclamped with a Kelly clamp. The inferior one half of each inferior turbinate was then resected with a pair of cross cutting scissors. Hemostasis was achieved with a suction cautery device.   The care of the patient was turned over to the anesthesiologist. The patient was awakened from anesthesia without difficulty. The patient was extubated and transferred to the recovery room in good condition.   OPERATIVE FINDINGS: Bilateral chronic maxillary and ethmoid sinusitis and polyposis. Bilateral inferior turbinate hypertrophy.  SPECIMEN: Bilateral sinus contents.   FOLLOWUP CARE: The patient be discharged home once he is awake and alert. The patient will be placed on Percocet 1 tablets p.o. q.4 hours p.r.n. pain, and augmentin 875 mg p.o. b.i.d. for 7 days. The patient will  follow up in my office in approximately 1 week.  [name], MD

## 2018-01-01 NOTE — Anesthesia Preprocedure Evaluation (Signed)
Anesthesia Evaluation  Patient identified by MRN, date of birth, ID band Patient awake    Reviewed: Allergy & Precautions, NPO status , Patient's Chart, lab work & pertinent test results  History of Anesthesia Complications (+) PONV  Airway Mallampati: II  TM Distance: >3 FB Neck ROM: Full    Dental   Pulmonary    Pulmonary exam normal        Cardiovascular hypertension, Pt. on medications Normal cardiovascular exam     Neuro/Psych Anxiety    GI/Hepatic GERD  Medicated and Controlled,  Endo/Other  Addisons disease on steroids  Renal/GU      Musculoskeletal   Abdominal   Peds  Hematology   Anesthesia Other Findings   Reproductive/Obstetrics                             Anesthesia Physical Anesthesia Plan  ASA: III  Anesthesia Plan: General   Post-op Pain Management:    Induction: Intravenous  PONV Risk Score and Plan: 3 and Ondansetron, Dexamethasone and Midazolam  Airway Management Planned: Oral ETT  Additional Equipment:   Intra-op Plan:   Post-operative Plan: Extubation in OR  Informed Consent: I have reviewed the patients History and Physical, chart, labs and discussed the procedure including the risks, benefits and alternatives for the proposed anesthesia with the patient or authorized representative who has indicated his/her understanding and acceptance.     Plan Discussed with: CRNA and Surgeon  Anesthesia Plan Comments:         Anesthesia Quick Evaluation

## 2018-01-01 NOTE — Transfer of Care (Signed)
Immediate Anesthesia Transfer of Care Note  Patient: John Lyons  Procedure(s) Performed: BILATERAL TURBINATE REDUCTION, BILATERAL TOTAL ENDOSCOPIC ETHOMIDECTOMY AND BILATERAL ENDOSCOPIC MAXILLARY ANTROSTOMY WITH TISSUE REMOVAL WITH FUSION (Bilateral Nose) BILATERAL TURBINATE REDUCTION (Bilateral Nose)  Patient Location: PACU  Anesthesia Type:General  Level of Consciousness: awake, sedated and patient cooperative  Airway & Oxygen Therapy: Patient Spontanous Breathing and aerosol face mask  Post-op Assessment: Report given to RN and Post -op Vital signs reviewed and stable  Post vital signs: Reviewed and stable  Last Vitals:  Vitals Value Taken Time  BP 138/67 01/01/2018 12:40 PM  Temp    Pulse 79 01/01/2018 12:42 PM  Resp 14 01/01/2018 12:42 PM  SpO2 100 % 01/01/2018 12:42 PM    Last Pain:  Vitals:   01/01/18 0925  TempSrc: Oral  PainSc: 2          Complications: No apparent anesthesia complications

## 2018-01-02 ENCOUNTER — Encounter (HOSPITAL_BASED_OUTPATIENT_CLINIC_OR_DEPARTMENT_OTHER): Payer: Self-pay | Admitting: Otolaryngology

## 2018-01-08 ENCOUNTER — Encounter (HOSPITAL_COMMUNITY): Payer: Self-pay | Admitting: Emergency Medicine

## 2018-01-08 ENCOUNTER — Emergency Department (HOSPITAL_COMMUNITY): Payer: Medicare Other

## 2018-01-08 ENCOUNTER — Emergency Department (HOSPITAL_COMMUNITY)
Admission: EM | Admit: 2018-01-08 | Discharge: 2018-01-09 | Disposition: A | Discharge status: Home / Self Care | Payer: Medicare Other | Attending: Emergency Medicine | Admitting: Emergency Medicine

## 2018-01-08 ENCOUNTER — Other Ambulatory Visit: Payer: Self-pay

## 2018-01-08 DIAGNOSIS — R079 Chest pain, unspecified: Secondary | ICD-10-CM

## 2018-01-08 DIAGNOSIS — Z79899 Other long term (current) drug therapy: Secondary | ICD-10-CM | POA: Diagnosis not present

## 2018-01-08 DIAGNOSIS — E785 Hyperlipidemia, unspecified: Secondary | ICD-10-CM | POA: Insufficient documentation

## 2018-01-08 DIAGNOSIS — R0602 Shortness of breath: Secondary | ICD-10-CM | POA: Diagnosis not present

## 2018-01-08 DIAGNOSIS — R0789 Other chest pain: Secondary | ICD-10-CM | POA: Diagnosis not present

## 2018-01-08 DIAGNOSIS — I10 Essential (primary) hypertension: Secondary | ICD-10-CM | POA: Diagnosis not present

## 2018-01-08 LAB — BASIC METABOLIC PANEL
Anion gap: 11 (ref 5–15)
BUN: 6 mg/dL — ABNORMAL LOW (ref 8–23)
CO2: 25 mmol/L (ref 22–32)
Calcium: 9.1 mg/dL (ref 8.9–10.3)
Chloride: 103 mmol/L (ref 98–111)
Creatinine, Ser: 0.56 mg/dL — ABNORMAL LOW (ref 0.61–1.24)
GLUCOSE: 97 mg/dL (ref 70–99)
POTASSIUM: 3.5 mmol/L (ref 3.5–5.1)
Sodium: 139 mmol/L (ref 135–145)

## 2018-01-08 LAB — CBC
HEMATOCRIT: 38.1 % — AB (ref 39.0–52.0)
HEMOGLOBIN: 12.8 g/dL — AB (ref 13.0–17.0)
MCH: 29.7 pg (ref 26.0–34.0)
MCHC: 33.6 g/dL (ref 30.0–36.0)
MCV: 88.4 fL (ref 78.0–100.0)
Platelets: 396 10*3/uL (ref 150–400)
RBC: 4.31 MIL/uL (ref 4.22–5.81)
RDW: 12.8 % (ref 11.5–15.5)
WBC: 10.4 10*3/uL (ref 4.0–10.5)

## 2018-01-08 LAB — I-STAT TROPONIN, ED: Troponin i, poc: 0.01 ng/mL (ref 0.00–0.08)

## 2018-01-08 NOTE — ED Triage Notes (Signed)
Pt c/o chest pain that radiates to the back and fevers x 2 days. Pt took tylenol at [place] today. Recent sinus surgery. Denies shortness of breath.

## 2018-01-09 ENCOUNTER — Emergency Department (HOSPITAL_COMMUNITY): Payer: Medicare Other

## 2018-01-09 DIAGNOSIS — J322 Chronic ethmoidal sinusitis: Secondary | ICD-10-CM | POA: Diagnosis not present

## 2018-01-09 DIAGNOSIS — J320 Chronic maxillary sinusitis: Secondary | ICD-10-CM | POA: Diagnosis not present

## 2018-01-09 DIAGNOSIS — R079 Chest pain, unspecified: Secondary | ICD-10-CM | POA: Diagnosis not present

## 2018-01-09 LAB — D-DIMER, QUANTITATIVE (NOT AT ARMC): D DIMER QUANT: 0.92 ug{FEU}/mL — AB (ref 0.00–0.50)

## 2018-01-09 MED ORDER — IOPAMIDOL (ISOVUE-370) INJECTION 76%
INTRAVENOUS | Status: AC
  Administered 2018-01-09: 55 mL via INTRAVENOUS
  Filled 2018-01-09: qty 100

## 2018-01-09 MED ORDER — IOPAMIDOL (ISOVUE-370) INJECTION 76%
100.0000 mL | Freq: Once | INTRAVENOUS | Status: AC | PRN
  Administered 2018-01-09: 55 mL via INTRAVENOUS

## 2018-01-09 NOTE — ED Provider Notes (Signed)
[facility] EMERGENCY DEPARTMENT Provider Note  CSN: [id number] Arrival date & time: 01/08/18 2107  Chief Complaint(s) Chest Pain  HPI John Lyons is a 72 y.o. male   The history is provided by the patient.  Chest Pain   This is a new problem. The current episode started 2 days ago. The problem occurs constantly. The problem has been resolved (exacerbated today, severe). Pain location: right anterior chest. Pain severity now: mild to severe. The quality of the pain is described as stabbing. Radiates to: right parascapular region. The symptoms are aggravated by certain positions. Associated symptoms include cough, a fever (100.4) and lower extremity edema (baseline). Pertinent negatives include no hemoptysis, no nausea and no shortness of breath.  His past medical history is significant for hyperlipidemia and hypertension.  Pertinent negatives for past medical history include no CHF, no diabetes, no DVT, no MI and no PE.    Past Medical History Past Medical History:  Diagnosis Date  . Addison's disease ([place])    takes Florinef and Cortef daily  . Anxiety    takes Xanax at bedtime  . Constipation    takes Linzess daily and Miralax as needed as well as Colace  . Diverticulosis   . Dry skin    on legs  . Dysrhythmia    takes Digoxin daily  . GERD (gastroesophageal reflux disease)    takes Pantoprazole daily  . H/O hiatal hernia   . History of blood transfusion   . History of colon polyps    benign  . Hyperlipidemia    takes Pravastatin daily  . Hypertension    takes Amlodipine and Micardis daily  . Joint pain   . Joint swelling   . PONV (postoperative nausea and vomiting)   . Rheumatoid arthritis(714.0)    Rheumatoid-takes Reclast yearly and Plaquenil daily  . Weakness    right hand.Knot on right hand,states came up 2 wks ago,=   Patient Active Problem List   Diagnosis Date Noted  . Dysrhythmia   . 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  Anxiety   . GERD (gastroesophageal reflux disease)   . Hyperlipidemia   . Hypertension   . Acute calculous cholecystitis   . Hypokalemia   . Abdominal pain 08/11/2015  . Addison's disease ([place]) 08/11/2015  . Spinal stenosis 08/11/2015  . Rheumatoid arthritis ([place]) 08/11/2015  . Diverticulosis 08/11/2015  . Fever 08/11/2015  . Pyrexia   . Midline low back pain without sciatica   . Essential hypertension 07/13/2015   Home Medication(s) Prior to Admission medications   Medication Sig Start Date End Date Taking? Authorizing Provider  acetaminophen (TYLENOL) 500 MG tablet Take 1,000 mg by mouth every 6 (six) hours as needed (for pain).   Yes [provider]  ALPRAZolam (XANAX) 0.25 MG tablet Take 0.75-1 mg by mouth at bedtime.    Yes [provider]  amLODipine (NORVASC) 2.5 MG tablet Take 2.5 mg by mouth daily with supper.    Yes [provider]  Dextromethorphan-guaiFENesin (MUCINEX DM) 30-600 MG TB12 Take 0.5-1 tablets by mouth 2 (two) times daily as needed (for coughing/congestion).   Yes [provider]  digoxin (LANOXIN) 0.125 MG tablet Take 0.0625 mg by mouth daily.    Yes [provider]  fludrocortisone (FLORINEF) 0.1 MG tablet Take 0.1 mg by mouth daily.    Yes [provider]  hydrocortisone (CORTEF) 20 MG tablet Take 20 mg by mouth daily.    Yes [provider]  hydroxychloroquine (PLAQUENIL) 200 MG tablet Take 200 mg by mouth daily.   Yes [provider]  linaclotide (LINZESS) 145 MCG CAPS capsule Take 145 mcg by mouth daily as needed (constipation).   Yes [provider]  pantoprazole (PROTONIX) 40 MG tablet Take 40 mg by mouth daily.   Yes [provider]  polyethylene glycol powder (GLYCOLAX/MIRALAX) powder Take 17 g by mouth daily as needed for mild constipation.   Yes [provider]  pravastatin (PRAVACHOL) 20 MG tablet Take 20 mg by mouth at bedtime.    Yes [provider]  Propylene Glycol-Glycerin (SOOTHE OP) Place 1-2 drops into both eyes 2 (two) times daily as needed (for dryness or irritation).   Yes [provider]  telmisartan (MICARDIS) 80 MG tablet Take 80 mg by mouth daily.   Yes [provider]  traMADol (ULTRAM) 50 MG tablet Take 50 mg by mouth every 12 (twelve) hours as needed for moderate pain.   Yes [provider]  amoxicillin-clavulanate (AUGMENTIN) 875-125 MG tablet Take 1 tablet by mouth 2 (two) times daily for 7 days. Patient not taking: Reported on 01/09/2018 01/01/18 01/09/18  Leta Baptist, MD  Cholecalciferol (VITAMIN D3) 1000 UNITS CAPS Take 1,000 Units by mouth daily.     [provider]  folic acid (FOLVITE) 409 MCG tablet Take 400 mcg by mouth daily.    [provider]  Multiple Vitamin (MULTIVITAMIN WITH MINERALS) TABS Take 1 tablet by mouth daily.    [provider]  oxyCODONE-acetaminophen (PERCOCET) 5-325 MG tablet Take 1 tablet by mouth every 4 (four) hours as needed for severe pain. 01/01/18   Leta Baptist, MD                                                                                                                                    Past Surgical History Past Surgical History:  Procedure Laterality Date  . CARDIOVASCULAR STRESS TEST  01/04/12   Dr. Gwenlyn Found [facility]  . CHOLECYSTECTOMY N/A 08/12/2015   Procedure: LAPAROSCOPIC CHOLECYSTECTOMY;  Surgeon: Mickeal Skinner, MD;  Location: [place];  Service: General;  Laterality: N/A;  . COLONOSCOPY    . COLONOSCOPY WITH ESOPHAGOGASTRODUODENOSCOPY (EGD) AND ESOPHAGEAL DILATION (ED)    . EYE SURGERY     cataracts  . SINUS ENDO WITH FUSION Bilateral 01/01/2018   Procedure: BILATERAL TURBINATE REDUCTION, BILATERAL TOTAL ENDOSCOPIC ETHOMIDECTOMY AND BILATERAL ENDOSCOPIC MAXILLARY ANTROSTOMY WITH TISSUE REMOVAL WITH FUSION;  Surgeon: Leta Baptist, MD;  Location: [facility];  Service: ENT;  Laterality:  Bilateral;  . TOTAL HIP ARTHROPLASTY  1978/1979   bilat  . TOTAL HIP ARTHROPLASTY  11/01/2015   REVISION OF LEFT TOTAL HIP ARTHROPLASTY WITH BOTH ACETABULAR AND FEMORAL COMPONENTS POSTERIOR APPROACH (Left) - Requesting RNFA  . TOTAL HIP REVISION Left 11/01/2015   Procedure: REVISION OF LEFT TOTAL HIP ARTHROPLASTY WITH BOTH ACETABULAR AND FEMORAL COMPONENTS  POSTERIOR APPROACH;  Surgeon: Rod Can, MD;  Location: [place];  Service: Orthopedics;  Laterality: Left;  Requesting RNFA  . TOTAL KNEE ARTHROPLASTY  1984   Left   . TOTAL KNEE ARTHROPLASTY  02/12/2012   right  . TOTAL KNEE ARTHROPLASTY  02/12/2012   Procedure: TOTAL KNEE ARTHROPLASTY;  Surgeon: Rudean Haskell, MD;  Location: [place];  Service: Orthopedics;  Laterality: Right;  . TURBINATE REDUCTION Bilateral 01/01/2018   Procedure: BILATERAL TURBINATE REDUCTION;  Surgeon: Leta Baptist, MD;  Location: [place];  Service: ENT;  Laterality: Bilateral;  . VASECTOMY  1979   Family History Family History  Problem Relation Age of Onset  . Diabetes Mother   . Heart disease Mother   . Testicular cancer Father   . Rheumatic fever Brother     Social History Social History   Tobacco Use  . Smoking status: Never Smoker  . Smokeless tobacco: Never Used  Substance Use Topics  . Alcohol use: No  . Drug use: No   Allergies Adhesive [tape] and Phenobarbital  Review of Systems Review of Systems  Constitutional: Positive for fever (100.4).  Respiratory: Positive for cough. Negative for hemoptysis and shortness of breath.   Cardiovascular: Positive for chest pain.  Gastrointestinal: Negative for nausea.   All other systems are reviewed and are negative for acute change except as noted in the HPI  Physical Exam Vital Signs  I have reviewed the triage vital signs BP (!) 150/67 (BP Location: Right Arm)   Pulse 69   Temp 98.2 F (36.8 C) (Oral)   Resp 10   SpO2 100%   Physical Exam  Constitutional: He is oriented to  person, place, and time. He appears well-developed and well-nourished. No distress.  HENT:  Head: Normocephalic and atraumatic.  Nose: Nose normal.  Eyes: Pupils are equal, round, and reactive to light. Conjunctivae and EOM are normal. Right eye exhibits no discharge. Left eye exhibits no discharge. No scleral icterus.  Neck: Normal range of motion. Neck supple.  Cardiovascular: Normal rate and regular rhythm. Exam reveals no gallop and no friction rub.  No murmur heard. Pulmonary/Chest: Effort normal and breath sounds normal. No stridor. No respiratory distress. He has no rales.     He exhibits tenderness.  Abdominal: Soft. He exhibits no distension. There is no tenderness.  Musculoskeletal: He exhibits no edema or tenderness.  Neurological: He is alert and oriented to person, place, and time.  Skin: Skin is warm and dry. No rash noted. He is not diaphoretic. No erythema.  Psychiatric: He has a normal mood and affect.  Vitals reviewed.   ED Results and Treatments Labs (all labs ordered are listed, but only abnormal results are displayed) Labs Reviewed  BASIC METABOLIC PANEL - Abnormal; Notable for the following components:      Result Value   BUN 6 (*)    Creatinine, Ser 0.56 (*)    All other components within normal limits  CBC - Abnormal; Notable for the following components:   Hemoglobin 12.8 (*)    HCT 38.1 (*)    All other components within normal limits  D-DIMER, QUANTITATIVE (NOT AT [facility]) - Abnormal; Notable for the following components:   D-Dimer, Quant 0.92 (*)    All other components within normal limits  I-STAT TROPONIN, ED  EKG  EKG Interpretation  Date/Time:  Tuesday January 08 2018 21:21:11 EDT Ventricular Rate:  80 PR Interval:  146 QRS Duration: 92 QT Interval:  380 QTC Calculation: 438 R Axis:   77 Text Interpretation:  Normal  sinus rhythm Normal ECG No significant change since last tracing Confirmed by Addison Lank [phone]) on 01/09/2018 1:39:04 AM      Radiology Dg Chest 2 View  Result Date: 01/08/2018 CLINICAL DATA:  Central chest pain radiating to the back. Shortness of breath and fever. Sinus surgery 1 week ago. EXAM: CHEST - 2 VIEW COMPARISON:  11/01/2015 FINDINGS: Normal heart size and pulmonary vascularity. No focal airspace disease or consolidation in the lungs. No blunting of costophrenic angles. No pneumothorax. Mediastinal contours appear intact. Degenerative changes in the spine and shoulders. Vascular calcifications. IMPRESSION: No active cardiopulmonary disease. Electronically Signed   By: Lucienne Capers M.D.   On: 01/08/2018 21:40   Ct Angio Chest Pe W And/or Wo Contrast  Result Date: 01/09/2018 CLINICAL DATA:  Chest pain radiating to the back. Fever for 2 days. EXAM: CT ANGIOGRAPHY CHEST WITH CONTRAST TECHNIQUE: Multidetector CT imaging of the chest was performed using the standard protocol during bolus administration of intravenous contrast. Multiplanar CT image reconstructions and MIPs were obtained to evaluate the vascular anatomy. CONTRAST:  80mL ISOVUE-370 IOPAMIDOL (ISOVUE-370) INJECTION 76% COMPARISON:  11/05/2015 FINDINGS: Cardiovascular: Good opacification of the central and segmental pulmonary arteries. No focal filling defects. No evidence of significant pulmonary embolus. Normal caliber thoracic aorta. Scattered calcifications. No dissection. Normal heart size. No pericardial effusions. Mediastinum/Nodes: Small esophageal hiatal hernia. Esophagus is mostly decompressed. No significant lymphadenopathy in the chest. Lungs/Pleura: Motion artifact limits examination. Mild dependent changes in the lung bases. No airspace disease or consolidation is suspected. No pleural effusions. No pneumothorax. Airways are patent. Upper Abdomen: Previous cholecystectomy. Musculoskeletal: Degenerative changes  throughout the thoracic spine. Mild endplate compression deformities at multiple midthoracic levels likely indicating osteoporosis. Severe degenerative changes in the shoulders. Review of the MIP images confirms the above findings. IMPRESSION: No evidence of significant pulmonary embolus. No evidence of active pulmonary disease. Small esophageal hiatal hernia. Aortic Atherosclerosis (ICD10-I70.0). Electronically Signed   By: Lucienne Capers M.D.   On: 01/09/2018 04:10   Pertinent labs & imaging results that were available during my care of the patient were reviewed by me and considered in my medical decision making (see chart for details).  Medications Ordered in ED Medications  iopamidol (ISOVUE-370) 76 % injection 100 mL (55 mLs Intravenous Contrast Given 01/09/18 0350)                                                                                                                                    Procedures Procedures  (including critical care time)  Medical Decision Making / ED Course I have reviewed the nursing notes for this encounter and the patient's prior records (if available in EHR or on provided  paperwork).    Patient here for several days of right-sided chest pain that was exacerbated today.  Now resolved.  Patient with recent sinus surgery.  History of autoimmune disorders.    Considering possible pulmonary embolism.  Dimer positive but CT a negative for PEs or pneumonia.  Low suspicion for cardiac etiology.  EKG without acute ischemic changes.  Troponin negative.  Presentation not classic for aortic dissection or esophageal perforation.  Labs without leukocytosis.  Hemoglobin improved from previous checks.  No significant electrolyte derangement or renal insufficiency.  The patient appears reasonably screened and/or stabilized for discharge and I doubt any other medical condition or other [facility] requiring further screening, evaluation, or treatment in the ED at this time prior  to discharge.  The patient is safe for discharge with strict return precautions.   Final Clinical Impression(s) / ED Diagnoses Final diagnoses:  Right-sided chest pain    Disposition: Discharge  Condition: Good  I have discussed the results, Dx and Tx plan with the patient who expressed understanding and agree(s) with the plan. Discharge instructions discussed at great length. The patient was given strict return precautions who verbalized understanding of the instructions. No further questions at time of discharge.    ED Discharge Orders    None       Follow Up: [name], [place] [id number] [phone]  Schedule an appointment as soon as possible for a visit  As needed  ENT  Go to  As scheduled for postop follow-up     This chart was dictated using voice recognition software.  Despite best efforts to proofread,  errors can occur which can change the documentation meaning.   Fatima Blank, MD 01/09/18 [phone]

## 2018-01-23 DIAGNOSIS — Z23 Encounter for immunization: Secondary | ICD-10-CM | POA: Diagnosis not present

## 2018-01-23 DIAGNOSIS — J320 Chronic maxillary sinusitis: Secondary | ICD-10-CM | POA: Diagnosis not present

## 2018-01-23 DIAGNOSIS — J338 Other polyp of sinus: Secondary | ICD-10-CM | POA: Diagnosis not present

## 2018-01-23 DIAGNOSIS — J322 Chronic ethmoidal sinusitis: Secondary | ICD-10-CM | POA: Diagnosis not present

## 2018-02-09 ENCOUNTER — Encounter (HOSPITAL_COMMUNITY): Payer: Self-pay | Admitting: Emergency Medicine

## 2018-02-09 ENCOUNTER — Emergency Department (HOSPITAL_COMMUNITY): Payer: Medicare Other

## 2018-02-09 ENCOUNTER — Inpatient Hospital Stay (HOSPITAL_COMMUNITY)
Admission: EM | Admit: 2018-02-09 | Discharge: 2018-02-18 | DRG: 871 | Disposition: A | Discharge status: Skilled Nursing Facility | Payer: Medicare Other | Attending: Family Medicine | Admitting: Family Medicine

## 2018-02-09 DIAGNOSIS — M6281 Muscle weakness (generalized): Secondary | ICD-10-CM | POA: Diagnosis not present

## 2018-02-09 DIAGNOSIS — Z91048 Other nonmedicinal substance allergy status: Secondary | ICD-10-CM

## 2018-02-09 DIAGNOSIS — R109 Unspecified abdominal pain: Secondary | ICD-10-CM | POA: Diagnosis not present

## 2018-02-09 DIAGNOSIS — K5792 Diverticulitis of intestine, part unspecified, without perforation or abscess without bleeding: Secondary | ICD-10-CM

## 2018-02-09 DIAGNOSIS — Z96643 Presence of artificial hip joint, bilateral: Secondary | ICD-10-CM | POA: Diagnosis present

## 2018-02-09 DIAGNOSIS — I10 Essential (primary) hypertension: Secondary | ICD-10-CM | POA: Diagnosis present

## 2018-02-09 DIAGNOSIS — K5732 Diverticulitis of large intestine without perforation or abscess without bleeding: Secondary | ICD-10-CM | POA: Diagnosis present

## 2018-02-09 DIAGNOSIS — Z66 Do not resuscitate: Secondary | ICD-10-CM | POA: Diagnosis present

## 2018-02-09 DIAGNOSIS — G8929 Other chronic pain: Secondary | ICD-10-CM | POA: Diagnosis present

## 2018-02-09 DIAGNOSIS — Z9849 Cataract extraction status, unspecified eye: Secondary | ICD-10-CM | POA: Diagnosis not present

## 2018-02-09 DIAGNOSIS — Z96653 Presence of artificial knee joint, bilateral: Secondary | ICD-10-CM | POA: Diagnosis present

## 2018-02-09 DIAGNOSIS — Z9852 Vasectomy status: Secondary | ICD-10-CM

## 2018-02-09 DIAGNOSIS — A419 Sepsis, unspecified organism: Secondary | ICD-10-CM | POA: Diagnosis present

## 2018-02-09 DIAGNOSIS — E785 Hyperlipidemia, unspecified: Secondary | ICD-10-CM | POA: Diagnosis present

## 2018-02-09 DIAGNOSIS — F419 Anxiety disorder, unspecified: Secondary | ICD-10-CM | POA: Diagnosis present

## 2018-02-09 DIAGNOSIS — J181 Lobar pneumonia, unspecified organism: Secondary | ICD-10-CM | POA: Diagnosis not present

## 2018-02-09 DIAGNOSIS — K567 Ileus, unspecified: Secondary | ICD-10-CM | POA: Diagnosis not present

## 2018-02-09 DIAGNOSIS — E271 Primary adrenocortical insufficiency: Secondary | ICD-10-CM | POA: Diagnosis present

## 2018-02-09 DIAGNOSIS — K219 Gastro-esophageal reflux disease without esophagitis: Secondary | ICD-10-CM | POA: Diagnosis present

## 2018-02-09 DIAGNOSIS — I499 Cardiac arrhythmia, unspecified: Secondary | ICD-10-CM | POA: Diagnosis not present

## 2018-02-09 DIAGNOSIS — E872 Acidosis: Secondary | ICD-10-CM | POA: Diagnosis not present

## 2018-02-09 DIAGNOSIS — Z7952 Long term (current) use of systemic steroids: Secondary | ICD-10-CM

## 2018-02-09 DIAGNOSIS — E876 Hypokalemia: Secondary | ICD-10-CM | POA: Diagnosis present

## 2018-02-09 DIAGNOSIS — K5900 Constipation, unspecified: Secondary | ICD-10-CM | POA: Diagnosis not present

## 2018-02-09 DIAGNOSIS — M549 Dorsalgia, unspecified: Secondary | ICD-10-CM | POA: Diagnosis present

## 2018-02-09 DIAGNOSIS — Z8719 Personal history of other diseases of the digestive system: Secondary | ICD-10-CM | POA: Diagnosis not present

## 2018-02-09 DIAGNOSIS — M069 Rheumatoid arthritis, unspecified: Secondary | ICD-10-CM | POA: Diagnosis present

## 2018-02-09 DIAGNOSIS — K56600 Partial intestinal obstruction, unspecified as to cause: Secondary | ICD-10-CM | POA: Diagnosis not present

## 2018-02-09 DIAGNOSIS — Z8043 Family history of malignant neoplasm of testis: Secondary | ICD-10-CM

## 2018-02-09 DIAGNOSIS — Z79899 Other long term (current) drug therapy: Secondary | ICD-10-CM | POA: Diagnosis not present

## 2018-02-09 DIAGNOSIS — H04129 Dry eye syndrome of unspecified lacrimal gland: Secondary | ICD-10-CM | POA: Diagnosis not present

## 2018-02-09 DIAGNOSIS — Z96642 Presence of left artificial hip joint: Secondary | ICD-10-CM | POA: Diagnosis not present

## 2018-02-09 DIAGNOSIS — Z888 Allergy status to other drugs, medicaments and biological substances status: Secondary | ICD-10-CM

## 2018-02-09 DIAGNOSIS — Z8249 Family history of ischemic heart disease and other diseases of the circulatory system: Secondary | ICD-10-CM

## 2018-02-09 DIAGNOSIS — R2681 Unsteadiness on feet: Secondary | ICD-10-CM | POA: Diagnosis not present

## 2018-02-09 DIAGNOSIS — K5730 Diverticulosis of large intestine without perforation or abscess without bleeding: Secondary | ICD-10-CM | POA: Diagnosis not present

## 2018-02-09 DIAGNOSIS — Z9049 Acquired absence of other specified parts of digestive tract: Secondary | ICD-10-CM

## 2018-02-09 LAB — URINALYSIS, ROUTINE W REFLEX MICROSCOPIC
Bilirubin Urine: NEGATIVE
Glucose, UA: NEGATIVE mg/dL
Hgb urine dipstick: NEGATIVE
Ketones, ur: 20 mg/dL — AB
LEUKOCYTES UA: NEGATIVE
NITRITE: NEGATIVE
PROTEIN: NEGATIVE mg/dL
SPECIFIC GRAVITY, URINE: 1.012 (ref 1.005–1.030)
pH: 8 (ref 5.0–8.0)

## 2018-02-09 LAB — BASIC METABOLIC PANEL
ANION GAP: 12 (ref 5–15)
BUN: 9 mg/dL (ref 8–23)
CALCIUM: 9.1 mg/dL (ref 8.9–10.3)
CHLORIDE: 100 mmol/L (ref 98–111)
CO2: 24 mmol/L (ref 22–32)
Creatinine, Ser: 0.66 mg/dL (ref 0.61–1.24)
GFR calc non Af Amer: >60 mL/min (ref >60)
Glucose, Bld: 106 mg/dL — ABNORMAL HIGH (ref 70–99)
Potassium: 3.2 mmol/L — ABNORMAL LOW (ref 3.5–5.1)
SODIUM: 136 mmol/L (ref 135–145)

## 2018-02-09 LAB — CBC
HEMATOCRIT: 43 % (ref 39.0–52.0)
HEMOGLOBIN: 13.6 g/dL (ref 13.0–17.0)
MCH: 28.6 pg (ref 26.0–34.0)
MCHC: 31.6 g/dL (ref 30.0–36.0)
MCV: 90.3 fL (ref 80.0–100.0)
NRBC: 0 % (ref 0.0–0.2)
Platelets: 375 10*3/uL (ref 150–400)
RBC: 4.76 MIL/uL (ref 4.22–5.81)
RDW: 12.3 % (ref 11.5–15.5)
WBC: 13.7 10*3/uL — ABNORMAL HIGH (ref 4.0–10.5)

## 2018-02-09 LAB — HEPATIC FUNCTION PANEL
ALBUMIN: 3 g/dL — AB (ref 3.5–5.0)
ALT: 11 U/L (ref 0–44)
AST: 16 U/L (ref 15–41)
Alkaline Phosphatase: 64 U/L (ref 38–126)
BILIRUBIN INDIRECT: 1.3 mg/dL — AB (ref 0.3–0.9)
Bilirubin, Direct: 0.4 mg/dL — ABNORMAL HIGH (ref 0.0–0.2)
Total Bilirubin: 1.7 mg/dL — ABNORMAL HIGH (ref 0.3–1.2)
Total Protein: 5.8 g/dL — ABNORMAL LOW (ref 6.5–8.1)

## 2018-02-09 LAB — LIPASE, BLOOD: Lipase: 23 U/L (ref 11–51)

## 2018-02-09 LAB — I-STAT CG4 LACTIC ACID, ED
LACTIC ACID, VENOUS: 2.01 mmol/L — AB (ref 0.5–1.9)
LACTIC ACID, VENOUS: 1.05 mmol/L (ref 0.5–1.9)

## 2018-02-09 MED ORDER — DIGOXIN 125 MCG PO TABS
0.0625 mg | ORAL_TABLET | Freq: Every day | ORAL | Status: DC
  Administered 2018-02-09 – 2018-02-18 (×10): 0.0625 mg via ORAL
  Filled 2018-02-09 (×10): qty 0.5

## 2018-02-09 MED ORDER — ACETAMINOPHEN 650 MG RE SUPP: 650.0000 mg | Freq: Four times a day (QID) | RECTAL | Status: DC | PRN

## 2018-02-09 MED ORDER — HYDROCORTISONE 20 MG PO TABS: 20.0000 mg | ORAL_TABLET | Freq: Every day | ORAL | Status: DC

## 2018-02-09 MED ORDER — LINACLOTIDE 145 MCG PO CAPS
145.0000 ug | ORAL_CAPSULE | Freq: Every day | ORAL | Status: DC | PRN
  Administered 2018-02-10: 145 ug via ORAL
  Filled 2018-02-09: qty 1

## 2018-02-09 MED ORDER — ENOXAPARIN SODIUM 40 MG/0.4ML ~~LOC~~ SOLN
40.0000 mg | SUBCUTANEOUS | Status: DC
  Administered 2018-02-10 – 2018-02-17 (×8): 40 mg via SUBCUTANEOUS
  Filled 2018-02-09 (×8): qty 0.4

## 2018-02-09 MED ORDER — FENTANYL CITRATE (PF) 100 MCG/2ML IJ SOLN
50.0000 ug | Freq: Once | INTRAMUSCULAR | Status: AC
  Administered 2018-02-09: 50 ug via INTRAVENOUS
  Filled 2018-02-09: qty 2

## 2018-02-09 MED ORDER — PANTOPRAZOLE SODIUM 40 MG PO TBEC
40.0000 mg | DELAYED_RELEASE_TABLET | Freq: Every day | ORAL | Status: DC
  Administered 2018-02-10 – 2018-02-18 (×9): 40 mg via ORAL
  Filled 2018-02-09 (×9): qty 1

## 2018-02-09 MED ORDER — SODIUM CHLORIDE 0.9 % IV SOLN
250.0000 mL | INTRAVENOUS | Status: DC | PRN
  Administered 2018-02-15: 250 mL via INTRAVENOUS

## 2018-02-09 MED ORDER — ONDANSETRON HCL 4 MG/2ML IJ SOLN
4.0000 mg | Freq: Four times a day (QID) | INTRAMUSCULAR | Status: DC | PRN
  Administered 2018-02-09 – 2018-02-12 (×5): 4 mg via INTRAVENOUS
  Filled 2018-02-09 (×5): qty 2

## 2018-02-09 MED ORDER — LACTATED RINGERS IV SOLN: INTRAVENOUS | Status: DC

## 2018-02-09 MED ORDER — METRONIDAZOLE IN NACL 5-0.79 MG/ML-% IV SOLN
500.0000 mg | Freq: Three times a day (TID) | INTRAVENOUS | Status: DC
  Administered 2018-02-10 – 2018-02-13 (×10): 500 mg via INTRAVENOUS
  Filled 2018-02-09 (×14): qty 100

## 2018-02-09 MED ORDER — FLUDROCORTISONE ACETATE 0.1 MG PO TABS
0.1000 mg | ORAL_TABLET | Freq: Every day | ORAL | Status: DC
  Administered 2018-02-09 – 2018-02-18 (×10): 0.1 mg via ORAL
  Filled 2018-02-09 (×12): qty 1

## 2018-02-09 MED ORDER — MORPHINE SULFATE (PF) 2 MG/ML IV SOLN
2.0000 mg | INTRAVENOUS | Status: DC | PRN
  Administered 2018-02-09 – 2018-02-10 (×3): 2 mg via INTRAVENOUS
  Filled 2018-02-09 (×3): qty 1

## 2018-02-09 MED ORDER — FOLIC ACID 1 MG PO TABS
500.0000 ug | ORAL_TABLET | Freq: Every day | ORAL | Status: DC
  Administered 2018-02-09 – 2018-02-18 (×10): 0.5 mg via ORAL
  Filled 2018-02-09 (×11): qty 1

## 2018-02-09 MED ORDER — SODIUM CHLORIDE 0.9 % IV SOLN
INTRAVENOUS | Status: DC
  Administered 2018-02-09: 18:00:00 via INTRAVENOUS

## 2018-02-09 MED ORDER — SODIUM CHLORIDE 0.9 % IV SOLN
2.0000 g | Freq: Two times a day (BID) | INTRAVENOUS | Status: DC
  Administered 2018-02-09 – 2018-02-12 (×7): 2 g via INTRAVENOUS
  Filled 2018-02-09 (×9): qty 2

## 2018-02-09 MED ORDER — SODIUM CHLORIDE 0.9 % IV SOLN
1.0000 g | INTRAVENOUS | Status: DC
  Administered 2018-02-09: 1 g via INTRAVENOUS
  Filled 2018-02-09 (×3): qty 10

## 2018-02-09 MED ORDER — ALPRAZOLAM 0.25 MG PO TABS
1.0000 mg | ORAL_TABLET | Freq: Every day | ORAL | Status: DC
  Administered 2018-02-10 – 2018-02-17 (×8): 1 mg via ORAL
  Filled 2018-02-09 (×10): qty 4

## 2018-02-09 MED ORDER — PRAVASTATIN SODIUM 40 MG PO TABS
20.0000 mg | ORAL_TABLET | Freq: Every day | ORAL | Status: DC
  Administered 2018-02-09 – 2018-02-11 (×3): 20 mg via ORAL
  Filled 2018-02-09 (×3): qty 1

## 2018-02-09 MED ORDER — ALBUTEROL SULFATE (2.5 MG/3ML) 0.083% IN NEBU: 2.5000 mg | INHALATION_SOLUTION | RESPIRATORY_TRACT | Status: DC | PRN

## 2018-02-09 MED ORDER — ONDANSETRON HCL 4 MG PO TABS: 4.0000 mg | ORAL_TABLET | Freq: Four times a day (QID) | ORAL | Status: DC | PRN

## 2018-02-09 MED ORDER — ACETAMINOPHEN 325 MG PO TABS
650.0000 mg | ORAL_TABLET | Freq: Four times a day (QID) | ORAL | Status: DC | PRN
  Administered 2018-02-10 – 2018-02-18 (×8): 650 mg via ORAL
  Filled 2018-02-09 (×8): qty 2

## 2018-02-09 MED ORDER — LACTATED RINGERS IV BOLUS
1000.0000 mL | Freq: Once | INTRAVENOUS | Status: AC
  Administered 2018-02-09: 1000 mL via INTRAVENOUS

## 2018-02-09 MED ORDER — POLYETHYLENE GLYCOL 3350 17 G PO PACK
17.0000 g | PACK | Freq: Every day | ORAL | Status: DC | PRN
  Filled 2018-02-09: qty 1

## 2018-02-09 MED ORDER — SENNOSIDES-DOCUSATE SODIUM 8.6-50 MG PO TABS: 1.0000 | ORAL_TABLET | Freq: Every evening | ORAL | Status: DC | PRN

## 2018-02-09 MED ORDER — POTASSIUM CHLORIDE CRYS ER 20 MEQ PO TBCR
40.0000 meq | EXTENDED_RELEASE_TABLET | Freq: Once | ORAL | Status: AC
  Administered 2018-02-09: 40 meq via ORAL
  Filled 2018-02-09: qty 2

## 2018-02-09 MED ORDER — OXYCODONE HCL 5 MG PO TABS
5.0000 mg | ORAL_TABLET | ORAL | Status: DC | PRN
  Administered 2018-02-09 – 2018-02-18 (×21): 5 mg via ORAL
  Filled 2018-02-09 (×23): qty 1

## 2018-02-09 MED ORDER — ACETAMINOPHEN 325 MG PO TABS
650.0000 mg | ORAL_TABLET | Freq: Once | ORAL | Status: AC | PRN
  Administered 2018-02-09: 650 mg via ORAL
  Filled 2018-02-09: qty 2

## 2018-02-09 MED ORDER — SODIUM CHLORIDE 0.9% FLUSH
3.0000 mL | Freq: Two times a day (BID) | INTRAVENOUS | Status: DC
  Administered 2018-02-11 – 2018-02-14 (×7): 3 mL via INTRAVENOUS

## 2018-02-09 MED ORDER — SODIUM CHLORIDE 0.9% FLUSH: 3.0000 mL | INTRAVENOUS | Status: DC | PRN

## 2018-02-09 MED ORDER — HYDROCORTISONE NA SUCCINATE PF 100 MG IJ SOLR
50.0000 mg | Freq: Three times a day (TID) | INTRAMUSCULAR | Status: DC
  Administered 2018-02-10 – 2018-02-11 (×5): 50 mg via INTRAVENOUS
  Filled 2018-02-09 (×5): qty 2

## 2018-02-09 MED ORDER — METRONIDAZOLE IN NACL 5-0.79 MG/ML-% IV SOLN
500.0000 mg | Freq: Once | INTRAVENOUS | Status: AC
  Administered 2018-02-09: 500 mg via INTRAVENOUS
  Filled 2018-02-09: qty 100

## 2018-02-09 MED ORDER — HYDROXYCHLOROQUINE SULFATE 200 MG PO TABS
200.0000 mg | ORAL_TABLET | Freq: Every day | ORAL | Status: DC
  Administered 2018-02-09 – 2018-02-18 (×10): 200 mg via ORAL
  Filled 2018-02-09 (×10): qty 1

## 2018-02-09 MED ORDER — SODIUM CHLORIDE 0.9 % IV SOLN
INTRAVENOUS | Status: DC
  Administered 2018-02-10 – 2018-02-17 (×9): via INTRAVENOUS

## 2018-02-09 MED ORDER — POLYETHYLENE GLYCOL 3350 17 GM/SCOOP PO POWD
17.0000 g | Freq: Every day | ORAL | Status: DC | PRN
  Filled 2018-02-09: qty 255

## 2018-02-09 MED ORDER — HYDRALAZINE HCL 20 MG/ML IJ SOLN: 10.0000 mg | Freq: Three times a day (TID) | INTRAMUSCULAR | Status: DC | PRN

## 2018-02-09 NOTE — Progress Notes (Signed)
Pharmacy Antibiotic Note  John Lyons is a 72 y.o. male admitted on 02/09/2018 with flank pain and concern for pyelo vs intra-abdominal infection. Pharmacy has been consulted for Cefepime and Flagyl dosing.  Plan: - Start Cefepime 2g IV every 12 hours - Continue Flagyl per MD - Will continue to follow renal function, culture results, LOT, and antibiotic de-escalation plans   Weight: 130 lb (59 kg)  Temp (24hrs), Avg:100.3 F (37.9 C), Min:99 F (37.2 C), Max:101.9 F (38.8 C)  Recent Labs  Lab 02/09/18 1038 02/09/18 1104 02/09/18 1353  WBC 13.7*  --   --   CREATININE 0.66  --   --   LATICACIDVEN  --  2.01* 1.05    Estimated Creatinine Clearance: 65.2 mL/min (by C-G formula based on SCr of 0.66 mg/dL).    Allergies  Allergen Reactions  . Adhesive [Tape] Rash  . Phenobarbital Rash    Antimicrobials this admission: CTX 10/19 x 1 Cefepime 10/19 >> Flagyl 10/19 >>  Dose adjustments this admission: n/a  Microbiology results: 10/19 BCx >> 10/19 UCx >>  Thank you for allowing pharmacy to be a part of this patient's care.  Alycia Rossetti, PharmD, BCPS Clinical Pharmacist Please check AMION for all [place] numbers 02/09/2018 5:49 PM

## 2018-02-09 NOTE — ED Notes (Signed)
Patient transported to X-ray 

## 2018-02-09 NOTE — ED Provider Notes (Addendum)
[facility] EMERGENCY DEPARTMENT Provider Note   CSN: [id number] Arrival date & time: 02/09/18  1019     History   Chief Complaint Chief Complaint  Patient presents with  . Flank Pain    HPI John Lyons is a 72 y.o. male.  Patient with history of Addison's disease who presents to the ED with left-sided flank pain.  Patient has noticed some darker urine.  He has no history of kidney stones.  Has felt general weakness.  Has a fever upon arrival.  Denies any nausea, vomiting, diarrhea.  The history is provided by the patient.  Flank Pain  This is a new problem. The current episode started yesterday. The problem occurs constantly. The problem has not changed since onset.Pertinent negatives include no chest pain, no abdominal pain, no headaches and no shortness of breath. Nothing aggravates the symptoms. Nothing relieves the symptoms. He has tried nothing for the symptoms. The treatment provided no relief.    Past Medical History:  Diagnosis Date  . Addison's disease ([place])    takes Florinef and Cortef daily  . Anxiety    takes Xanax at bedtime  . Constipation    takes Linzess daily and Miralax as needed as well as Colace  . Diverticulosis   . Dry skin    on legs  . Dysrhythmia    takes Digoxin daily  . GERD (gastroesophageal reflux disease)    takes Pantoprazole daily  . H/O hiatal hernia   . History of blood transfusion   . History of colon polyps    benign  . Hyperlipidemia    takes Pravastatin daily  . Hypertension    takes Amlodipine and Micardis daily  . Joint pain   . Joint swelling   . PONV (postoperative nausea and vomiting)   . Rheumatoid arthritis(714.0)    Rheumatoid-takes Reclast yearly and Plaquenil daily  . Weakness    right hand.Knot on right hand,states came up 2 wks ago,=    Patient Active Problem List   Diagnosis Date Noted  . Dysrhythmia   . 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 Anxiety   . GERD  (gastroesophageal reflux disease)   . Hyperlipidemia   . Hypertension   . Acute calculous cholecystitis   . Hypokalemia   . Abdominal pain 08/11/2015  . Addison's disease ([place]) 08/11/2015  . Spinal stenosis 08/11/2015  . Rheumatoid arthritis ([place]) 08/11/2015  . Diverticulosis 08/11/2015  . Fever 08/11/2015  . Pyrexia   . Midline low back pain without sciatica   . Essential hypertension 07/13/2015    Past Surgical History:  Procedure Laterality Date  . CARDIOVASCULAR STRESS TEST  01/04/12   Dr. Gwenlyn Found - [facility]  . CHOLECYSTECTOMY N/A 08/12/2015   Procedure: LAPAROSCOPIC CHOLECYSTECTOMY;  Surgeon: Mickeal Skinner, MD;  Location: [facility];  Service: General;  Laterality: N/A;  . COLONOSCOPY    . COLONOSCOPY WITH ESOPHAGOGASTRODUODENOSCOPY (EGD) AND ESOPHAGEAL DILATION (ED)    . EYE SURGERY     cataracts  . SINUS ENDO WITH FUSION Bilateral 01/01/2018   Procedure: BILATERAL TURBINATE REDUCTION, BILATERAL TOTAL ENDOSCOPIC ETHOMIDECTOMY AND BILATERAL ENDOSCOPIC MAXILLARY ANTROSTOMY WITH TISSUE REMOVAL WITH FUSION;  Surgeon: Leta Baptist, MD;  Location: [facility];  Service: ENT;  Laterality: Bilateral;  . TOTAL HIP ARTHROPLASTY  1978/1979   bilat  . TOTAL HIP ARTHROPLASTY  11/01/2015   REVISION OF LEFT TOTAL HIP ARTHROPLASTY WITH BOTH ACETABULAR AND FEMORAL COMPONENTS POSTERIOR APPROACH (Left) - Requesting RNFA  .  TOTAL HIP REVISION Left 11/01/2015   Procedure: REVISION OF LEFT TOTAL HIP ARTHROPLASTY WITH BOTH ACETABULAR AND FEMORAL COMPONENTS POSTERIOR APPROACH;  Surgeon: Rod Can, MD;  Location: [facility];  Service: Orthopedics;  Laterality: Left;  Requesting RNFA  . TOTAL KNEE ARTHROPLASTY  1984   Left   . TOTAL KNEE ARTHROPLASTY  02/12/2012   right  . TOTAL KNEE ARTHROPLASTY  02/12/2012   Procedure: TOTAL KNEE ARTHROPLASTY;  Surgeon: Rudean Haskell, MD;  Location: [facility];  Service: Orthopedics;  Laterality: Right;  . TURBINATE REDUCTION Bilateral 01/01/2018     Procedure: BILATERAL TURBINATE REDUCTION;  Surgeon: Leta Baptist, MD;  Location: [facility];  Service: ENT;  Laterality: Bilateral;  . VASECTOMY  1979        Home Medications    Prior to Admission medications   Medication Sig Start Date End Date Taking? Authorizing Provider  acetaminophen (TYLENOL) 500 MG tablet Take 1,000 mg by mouth every 6 (six) hours as needed (for pain).   Yes [provider]  ALPRAZolam (XANAX) 0.25 MG tablet Take 0.75-1 mg by mouth at bedtime.    Yes [provider]  amLODipine (NORVASC) 2.5 MG tablet Take 2.5 mg by mouth daily with supper.    Yes [provider]  Cholecalciferol (VITAMIN D3) 1000 UNITS CAPS Take 1,000 Units by mouth daily.    Yes [provider]  Dextromethorphan-guaiFENesin (MUCINEX DM) 30-600 MG TB12 Take 0.5-1 tablets by mouth 2 (two) times daily as needed (for coughing/congestion).   Yes [provider]  digoxin (LANOXIN) 0.125 MG tablet Take 0.0625 mg by mouth daily.    Yes [provider]  fludrocortisone (FLORINEF) 0.1 MG tablet Take 0.1 mg by mouth daily.    Yes [provider]  folic acid (FOLVITE) 315 MCG tablet Take 400 mcg by mouth daily.   Yes [provider]  hydrocortisone (CORTEF) 20 MG tablet Take 20 mg by mouth daily.    Yes [provider]  hydroxychloroquine (PLAQUENIL) 200 MG tablet Take 200 mg by mouth daily.   Yes [provider]  linaclotide (LINZESS) 145 MCG CAPS capsule Take 145 mcg by mouth daily as needed (constipation).   Yes [provider]  Multiple Vitamin (MULTIVITAMIN WITH MINERALS) TABS Take 1 tablet by mouth daily.   Yes [provider]  pantoprazole (PROTONIX) 40 MG tablet Take 40 mg by mouth daily.   Yes [provider]  polyethylene glycol powder (GLYCOLAX/MIRALAX) powder Take 17 g by mouth daily as needed for mild constipation.   Yes [provider]  pravastatin (PRAVACHOL)  20 MG tablet Take 20 mg by mouth at bedtime.    Yes [provider]  Propylene Glycol-Glycerin (SOOTHE OP) Place 1-2 drops into both eyes 2 (two) times daily as needed (for dryness or irritation).   Yes [provider]  telmisartan (MICARDIS) 80 MG tablet Take 80 mg by mouth daily.   Yes [provider]  traMADol (ULTRAM) 50 MG tablet Take 50 mg by mouth every 12 (twelve) hours as needed for moderate pain.   Yes [provider]  oxyCODONE-acetaminophen (PERCOCET) 5-325 MG tablet Take 1 tablet by mouth every 4 (four) hours as needed for severe pain. Patient not taking: Reported on 02/09/2018 01/01/18   Leta Baptist, MD    Family History Family History  Problem Relation Age of Onset  . Diabetes Mother   . Heart disease Mother   . Testicular cancer Father   . Rheumatic fever Brother  Social History Social History   Tobacco Use  . Smoking status: Never Smoker  . Smokeless tobacco: Never Used  Substance Use Topics  . Alcohol use: No  . Drug use: No     Allergies   Adhesive [tape] and Phenobarbital   Review of Systems Review of Systems  Constitutional: Positive for chills and fever.  HENT: Negative for ear pain and sore throat.   Eyes: Negative for pain and visual disturbance.  Respiratory: Negative for cough and shortness of breath.   Cardiovascular: Negative for chest pain and palpitations.  Gastrointestinal: Negative for abdominal pain and vomiting.  Genitourinary: Positive for flank pain. Negative for decreased urine volume, difficulty urinating, dysuria, hematuria, testicular pain and urgency.  Musculoskeletal: Negative for arthralgias and back pain.  Skin: Negative for color change and rash.  Neurological: Negative for seizures, syncope and headaches.  All other systems reviewed and are negative.    Physical Exam Updated Vital Signs  ED Triage Vitals  Enc Vitals Group     BP 02/09/18 1026 (!) 149/64     Pulse Rate 02/09/18 1026  90     Resp 02/09/18 1026 18     Temp 02/09/18 1026 (!) 101.9 F (38.8 C)     Temp Source 02/09/18 1026 Oral     SpO2 02/09/18 1026 96 %     Weight 02/09/18 1121 130 lb (59 kg)     Height --      Head Circumference --      Peak Flow --      Pain Score 02/09/18 1026 10     Pain Loc --      Pain Edu? --      Excl. in [place]? --     Physical Exam  Constitutional: He is oriented to person, place, and time. He appears well-developed and well-nourished. He appears distressed.  HENT:  Head: Normocephalic and atraumatic.  Eyes: Pupils are equal, round, and reactive to light. Conjunctivae and EOM are normal.  Neck: Normal range of motion. Neck supple.  Cardiovascular: Normal rate, regular rhythm, normal heart sounds and intact distal pulses.  No murmur heard. Pulmonary/Chest: Effort normal and breath sounds normal. No respiratory distress.  Abdominal: Soft. He exhibits no distension. There is tenderness (TTP in left CVA/left side of abdomen). There is no guarding.  Musculoskeletal: Normal range of motion. He exhibits no edema.  Neurological: He is alert and oriented to person, place, and time.  Skin: Skin is warm and dry. Capillary refill takes less than 2 seconds.  Psychiatric: He has a normal mood and affect.  Nursing note and vitals reviewed.    ED Treatments / Results  Labs (all labs ordered are listed, but only abnormal results are displayed) Labs Reviewed  URINALYSIS, ROUTINE W REFLEX MICROSCOPIC - Abnormal; Notable for the following components:      Result Value   Ketones, ur 20 (*)    All other components within normal limits  CBC - Abnormal; Notable for the following components:   WBC 13.7 (*)    All other components within normal limits  BASIC METABOLIC PANEL - Abnormal; Notable for the following components:   Potassium 3.2 (*)    Glucose, Bld 106 (*)    All other components within normal limits  I-STAT CG4 LACTIC ACID, ED - Abnormal; Notable for the following  components:   Lactic Acid, Venous 2.01 (*)    All other components within normal limits  CULTURE, BLOOD (ROUTINE X 2)  CULTURE, BLOOD (ROUTINE  X 2)  URINE CULTURE  I-STAT CG4 LACTIC ACID, ED    EKG None  Radiology Dg Chest 2 View  Result Date: 02/09/2018 CLINICAL DATA:  Severe back pain with fever 3 days. EXAM: CHEST - 2 VIEW COMPARISON:  01/08/2018 and CT 01/09/2018 FINDINGS: Lungs are adequately inflated without focal airspace consolidation or effusion. Cardiomediastinal silhouette is within normal. There is degenerative change of the spine with a stable mild compression deformity over the mid to lower thoracic spine. Moderate degenerative change of the shoulders. IMPRESSION: No acute cardiopulmonary disease. Stable mild compression fracture over the mid to lower thoracic spine. Electronically Signed   By: Marin Olp M.D.   On: 02/09/2018 12:59   Ct Renal Stone Study  Result Date: 02/09/2018 CLINICAL DATA:  Left flank pain and hematuria beginning 2 days ago. Stone disease suspected. EXAM: CT ABDOMEN AND PELVIS WITHOUT CONTRAST TECHNIQUE: Multidetector CT imaging of the abdomen and pelvis was performed following the standard protocol without IV contrast. COMPARISON:  CT of the abdomen and pelvis 08/11/2015 FINDINGS: Lower chest: Mild dependent atelectasis is present in the lower lobes bilaterally. No significant effusion is present. Coronary artery calcifications are present. Heart size is normal. Blood pool is hypodense consistent with borderline anemia. Hepatobiliary: 2 subcentimeter lesions in the left lobe of the liver are stable. Likely represent small cysts. Patient is status post cholecystectomy. Common bile duct is within normal limits. Pancreas: Unremarkable. No pancreatic ductal dilatation or surrounding inflammatory changes. Spleen: Normal in size without focal abnormality. Adrenals/Urinary Tract: The adrenal glands are within normal limits bilaterally. Kidneys are unremarkable.  There is no stone or mass lesion. No hydronephrosis is present. The ureters are within normal limits bilaterally. Distal ureters are obscured by streak artifact from bilateral hip prostheses. Stomach/Bowel: A small hiatal hernia is present. The stomach and duodenum are otherwise within normal limits. Small bowel is unremarkable. Terminal ileum is within normal limits. The appendix is visualized and normal. Moderate stool is present in the cecum and ascending colon. Transverse colon is within normal limits. Diverticular changes are present in the distal descending and sigmoid colon. Although the area is somewhat obscured by streak artifact, there appears to be some stranding about the sigmoid colon. No definite free air or abscess formation is present. Vascular/Lymphatic: Atherosclerotic calcifications are present in the aorta and branch vessels without aneurysm. No significant adenopathy is present. No significant adenopathy is present. Reproductive: Prostate is unremarkable. Other: No abdominal wall hernia or abnormality. No abdominopelvic ascites. Musculoskeletal: Bilateral total hip arthroplasty is present. Degenerative changes are noted in the lumbar spine with levoconvex curvature centered at L4-5. Facet degenerative changes are greatest at L4-5 and L5-S1. No focal lytic or blastic lesions are present. IMPRESSION: 1. Diffuse descending and sigmoid diverticulosis with probable inflammatory changes about the sigmoid colon suggesting diverticulitis. The area is somewhat obscured by streak artifact from bilateral hip prostheses. No complicating features are evident. 2. No nephrolithiasis or hydronephrosis. 3.  Aortic Atherosclerosis (ICD10-I70.0). 4. Coronary artery disease. 5. Mild dependent atelectasis at the lung bases bilaterally. Electronically Signed   By: San Morelle M.D.   On: 02/09/2018 16:19    Procedures .Critical Care Performed by: Lennice Sites, DO Authorized by: Lennice Sites, DO    Critical care provider statement:    Critical care time (minutes):  32   Critical care time was exclusive of:  Separately billable procedures and treating other patients and teaching time   Critical care was necessary to treat or prevent imminent or life-threatening deterioration  of the following conditions:  Sepsis   Critical care was time spent personally by me on the following activities:  Development of treatment plan with patient or surrogate, discussions with consultants, evaluation of patient's response to treatment, examination of patient, ordering and performing treatments and interventions, ordering and review of laboratory studies, ordering and review of radiographic studies, pulse oximetry, re-evaluation of patient's condition and review of old charts   I assumed direction of critical care for this patient from another provider in my specialty: no     (including critical care time)  Medications Ordered in ED Medications  cefTRIAXone (ROCEPHIN) 1 g in sodium chloride 0.9 % 100 mL IVPB (0 g Intravenous Stopped 02/09/18 1457)  lactated ringers infusion (has no administration in time range)  metroNIDAZOLE (FLAGYL) IVPB 500 mg (has no administration in time range)  fentaNYL (SUBLIMAZE) injection 50 mcg (has no administration in time range)  acetaminophen (TYLENOL) tablet 650 mg (650 mg Oral Given 02/09/18 1032)  lactated ringers bolus 1,000 mL (0 mLs Intravenous Stopped 02/09/18 1339)  fentaNYL (SUBLIMAZE) injection 50 mcg (50 mcg Intravenous Given 02/09/18 1150)     Initial Impression / Assessment and Plan / ED Course  I have reviewed the triage vital signs and the nursing notes.  Pertinent labs & imaging results that were available during my care of the patient were reviewed by me and considered in my medical decision making (see chart for details).     John Lyons is a 72 year old male with history of diverticulitis, Addison's disease who presents to the ED with  left-sided abdominal pain, flank pain.  Patient with fever upon arrival.  Patient with pain for the last 2 days.  Denies any urinary symptoms.  Has noticed some blood in his urine.  Denies any history of kidney stones.  Denies any nausea, vomiting, chest pain.  Patient with fever and left flank pain and abdominal pain on exam.  Concern for pyelonephritis, kidney stone, intra-abdominal process.  Sepsis work-up initiated with blood cultures, urine studies, chest x-ray, CT abdomen and pelvis.  Patient had elevated white count elevated lactic acid and was started empirically on Rocephin.  Patient given lactated Ringer bolus.  CT scan showed likely diverticulitis.  Otherwise unremarkable CT scan.  Patient with no significant findings on chest x-ray, urinalysis.  Patient otherwise with no significant anemia, electrolyte abnormality.  Lactic acid improved with IV fluids.  Given ongoing pain and concern for sepsis patient to be admitted to medicine for further IV antibiotics and fluids.  Patient given IV Flagyl as well to cover for diverticulitis.  Patient admitted to medicine in stable condition.  This chart was dictated using voice recognition software.  Despite best efforts to proofread,  errors can occur which can change the documentation meaning.   Final Clinical Impressions(s) / ED Diagnoses   Final diagnoses:  Sepsis, due to unspecified organism, unspecified whether acute organ dysfunction present [facility])  Acute diverticulitis    ED Discharge Orders    None       Lennice Sites, DO 02/09/18 [place], [place], DO 02/09/18 1709

## 2018-02-09 NOTE — ED Notes (Signed)
Attempted report x1. 

## 2018-02-09 NOTE — ED Triage Notes (Signed)
Pt to ER for evaluation of left flank pain with hematuria onset 2 days ago. Unknown if running fever at home, reports chills last night. VSS. Pt is a/o x4. Has rheumatoid arthritis and addison's disease.

## 2018-02-09 NOTE — Progress Notes (Signed)
Pt admitted to 6N24 from ED with diverticulitis, daughter at bedside.  Oriented to room and dept.

## 2018-02-09 NOTE — H&P (Signed)
History and Physical  John Lyons JHE:[id number] DOB: 1945-10-11 DOA: 02/09/2018  Referring physician: EDP PCP: [name], MD  Patient coming from: Home & is able to ambulate with a walker  Chief Complaint: Abdominal pain   HPI: John Lyons is a 72 y.o. male with medical history significant for Addison's disease, hypertension, hyperlipidemia, rheumatoid arthritis, diverticulosis, presents to the ER complaining of left lower quadrant/left flank sharp pain ongoing for the past 2 days, 10/10 in severity at its worse, does not radiate, nothing makes it better or worse.  Patient denies any associated nausea, vomiting, diarrhea.  Noted some darker urine.  Denies any chest pain, shortness of breath, cough, dizziness, lethargy.  Of note, patient presented to the ER on 01/08/2018 with similar symptoms, was discharged from the ER with pain meds.  ED Course: Patient noted to have a fever of 101.9, leukocytosis, lactic acidosis.  CT renal stone showed diffuse descending and sigmoid diverticulosis with probable inflammatory changes suggesting diverticulitis.  No free air or abscess formation is present.  Patient was given IV antibiotics, IV fluids.  Patient admitted for further management.  Review of Systems: Review of systems are otherwise negative   Past Medical History:  Diagnosis Date  . Addison's disease ([place])    takes Florinef and Cortef daily  . Anxiety    takes Xanax at bedtime  . Constipation    takes Linzess daily and Miralax as needed as well as Colace  . Diverticulosis   . Dry skin    on legs  . Dysrhythmia    takes Digoxin daily  . GERD (gastroesophageal reflux disease)    takes Pantoprazole daily  . H/O hiatal hernia   . History of blood transfusion   . History of colon polyps    benign  . Hyperlipidemia    takes Pravastatin daily  . Hypertension    takes Amlodipine and Micardis daily  . Joint pain   . Joint swelling   . PONV (postoperative nausea and vomiting)     . Rheumatoid arthritis(714.0)    Rheumatoid-takes Reclast yearly and Plaquenil daily  . Weakness    right hand.Knot on right hand,states came up 2 wks ago,=   Past Surgical History:  Procedure Laterality Date  . CARDIOVASCULAR STRESS TEST  01/04/12   Dr. Gwenlyn Found - [facility]  . CHOLECYSTECTOMY N/A 08/12/2015   Procedure: LAPAROSCOPIC CHOLECYSTECTOMY;  Surgeon: Mickeal Skinner, MD;  Location: [facility];  Service: General;  Laterality: N/A;  . COLONOSCOPY    . COLONOSCOPY WITH ESOPHAGOGASTRODUODENOSCOPY (EGD) AND ESOPHAGEAL DILATION (ED)    . EYE SURGERY     cataracts  . SINUS ENDO WITH FUSION Bilateral 01/01/2018   Procedure: BILATERAL TURBINATE REDUCTION, BILATERAL TOTAL ENDOSCOPIC ETHOMIDECTOMY AND BILATERAL ENDOSCOPIC MAXILLARY ANTROSTOMY WITH TISSUE REMOVAL WITH FUSION;  Surgeon: Leta Baptist, MD;  Location: [facility];  Service: ENT;  Laterality: Bilateral;  . TOTAL HIP ARTHROPLASTY  1978/1979   bilat  . TOTAL HIP ARTHROPLASTY  11/01/2015   REVISION OF LEFT TOTAL HIP ARTHROPLASTY WITH BOTH ACETABULAR AND FEMORAL COMPONENTS POSTERIOR APPROACH (Left) - Requesting RNFA  . TOTAL HIP REVISION Left 11/01/2015   Procedure: REVISION OF LEFT TOTAL HIP ARTHROPLASTY WITH BOTH ACETABULAR AND FEMORAL COMPONENTS POSTERIOR APPROACH;  Surgeon: Rod Can, MD;  Location: [facility];  Service: Orthopedics;  Laterality: Left;  Requesting RNFA  . TOTAL KNEE ARTHROPLASTY  1984   Left   . TOTAL KNEE ARTHROPLASTY  02/12/2012   right  . TOTAL KNEE  ARTHROPLASTY  02/12/2012   Procedure: TOTAL KNEE ARTHROPLASTY;  Surgeon: Rudean Haskell, MD;  Location: [facility];  Service: Orthopedics;  Laterality: Right;  . TURBINATE REDUCTION Bilateral 01/01/2018   Procedure: BILATERAL TURBINATE REDUCTION;  Surgeon: Leta Baptist, MD;  Location: [facility];  Service: ENT;  Laterality: Bilateral;  . VASECTOMY  1979    Social History:  reports that he has never smoked. He has never used smokeless  tobacco. He reports that he does not drink alcohol or use drugs.   Allergies  Allergen Reactions  . Adhesive [Tape] Rash  . Phenobarbital Rash    Family History  Problem Relation Age of Onset  . Diabetes Mother   . Heart disease Mother   . Testicular cancer Father   . Rheumatic fever Brother       Prior to Admission medications   Medication Sig Start Date End Date Taking? Authorizing Provider  acetaminophen (TYLENOL) 500 MG tablet Take 1,000 mg by mouth every 6 (six) hours as needed (for pain).   Yes [provider]  ALPRAZolam (XANAX) 0.25 MG tablet Take 0.75-1 mg by mouth at bedtime.    Yes [provider]  amLODipine (NORVASC) 2.5 MG tablet Take 2.5 mg by mouth daily with supper.    Yes [provider]  Cholecalciferol (VITAMIN D3) 1000 UNITS CAPS Take 1,000 Units by mouth daily.    Yes [provider]  Dextromethorphan-guaiFENesin (MUCINEX DM) 30-600 MG TB12 Take 0.5-1 tablets by mouth 2 (two) times daily as needed (for coughing/congestion).   Yes [provider]  digoxin (LANOXIN) 0.125 MG tablet Take 0.0625 mg by mouth daily.    Yes [provider]  fludrocortisone (FLORINEF) 0.1 MG tablet Take 0.1 mg by mouth daily.    Yes [provider]  folic acid (FOLVITE) 299 MCG tablet Take 400 mcg by mouth daily.   Yes [provider]  hydrocortisone (CORTEF) 20 MG tablet Take 20 mg by mouth daily.    Yes [provider]  hydroxychloroquine (PLAQUENIL) 200 MG tablet Take 200 mg by mouth daily.   Yes [provider]  linaclotide (LINZESS) 145 MCG CAPS capsule Take 145 mcg by mouth daily as needed (constipation).   Yes [provider]  Multiple Vitamin (MULTIVITAMIN WITH MINERALS) TABS Take 1 tablet by mouth daily.   Yes [provider]  pantoprazole (PROTONIX) 40 MG tablet Take 40 mg by mouth daily.   Yes [provider]  polyethylene glycol powder (GLYCOLAX/MIRALAX)  powder Take 17 g by mouth daily as needed for mild constipation.   Yes [provider]  pravastatin (PRAVACHOL) 20 MG tablet Take 20 mg by mouth at bedtime.    Yes [provider]  Propylene Glycol-Glycerin (SOOTHE OP) Place 1-2 drops into both eyes 2 (two) times daily as needed (for dryness or irritation).   Yes [provider]  telmisartan (MICARDIS) 80 MG tablet Take 80 mg by mouth daily.   Yes [provider]  traMADol (ULTRAM) 50 MG tablet Take 50 mg by mouth every 12 (twelve) hours as needed for moderate pain.   Yes [provider]  oxyCODONE-acetaminophen (PERCOCET) 5-325 MG tablet Take 1 tablet by mouth every 4 (four) hours as needed for severe pain. Patient not taking: Reported on 02/09/2018 01/01/18   Leta Baptist, MD    Physical Exam: BP (!) 144/64   Pulse 92   Temp 99 F (37.2 C) (Oral)   Resp (!) 25   Wt 59 kg  SpO2 94%   BMI 23.03 kg/m   General: Mild distress due to pain Eyes: Normal ENT: Normal Neck: Supple Cardiovascular: S1, S2 present Respiratory: CTA B Abdomen: Soft, TTP on LLQ/left flank area, nondistended, bowel sounds present Skin: Normal Musculoskeletal: No pedal edema bilaterally Psychiatric: Normal mood Neurologic: Focal neurologic deficit noted          Labs on Admission:  Basic Metabolic Panel: Recent Labs  Lab 02/09/18 1038  NA 136  K 3.2*  CL 100  CO2 24  GLUCOSE 106*  BUN 9  CREATININE 0.66  CALCIUM 9.1   Liver Function Tests: No results for input(s): AST, ALT, ALKPHOS, BILITOT, PROT, ALBUMIN in the last 168 hours. No results for input(s): LIPASE, AMYLASE in the last 168 hours. No results for input(s): AMMONIA in the last 168 hours. CBC: Recent Labs  Lab 02/09/18 1038  WBC 13.7*  HGB 13.6  HCT 43.0  MCV 90.3  PLT 375   Cardiac Enzymes: No results for input(s): CKTOTAL, CKMB, CKMBINDEX, TROPONINI in the last 168 hours.  BNP (last 3 results) No results for input(s): BNP in the last  8760 hours.  ProBNP (last 3 results) No results for input(s): PROBNP in the last 8760 hours.  CBG: No results for input(s): GLUCAP in the last 168 hours.  Radiological Exams on Admission: Dg Chest 2 View  Result Date: 02/09/2018 CLINICAL DATA:  Severe back pain with fever 3 days. EXAM: CHEST - 2 VIEW COMPARISON:  01/08/2018 and CT 01/09/2018 FINDINGS: Lungs are adequately inflated without focal airspace consolidation or effusion. Cardiomediastinal silhouette is within normal. There is degenerative change of the spine with a stable mild compression deformity over the mid to lower thoracic spine. Moderate degenerative change of the shoulders. IMPRESSION: No acute cardiopulmonary disease. Stable mild compression fracture over the mid to lower thoracic spine. Electronically Signed   By: Marin Olp M.D.   On: 02/09/2018 12:59   Ct Renal Stone Study  Result Date: 02/09/2018 CLINICAL DATA:  Left flank pain and hematuria beginning 2 days ago. Stone disease suspected. EXAM: CT ABDOMEN AND PELVIS WITHOUT CONTRAST TECHNIQUE: Multidetector CT imaging of the abdomen and pelvis was performed following the standard protocol without IV contrast. COMPARISON:  CT of the abdomen and pelvis 08/11/2015 FINDINGS: Lower chest: Mild dependent atelectasis is present in the lower lobes bilaterally. No significant effusion is present. Coronary artery calcifications are present. Heart size is normal. Blood pool is hypodense consistent with borderline anemia. Hepatobiliary: 2 subcentimeter lesions in the left lobe of the liver are stable. Likely represent small cysts. Patient is status post cholecystectomy. Common bile duct is within normal limits. Pancreas: Unremarkable. No pancreatic ductal dilatation or surrounding inflammatory changes. Spleen: Normal in size without focal abnormality. Adrenals/Urinary Tract: The adrenal glands are within normal limits bilaterally. Kidneys are unremarkable. There is no stone or mass  lesion. No hydronephrosis is present. The ureters are within normal limits bilaterally. Distal ureters are obscured by streak artifact from bilateral hip prostheses. Stomach/Bowel: A small hiatal hernia is present. The stomach and duodenum are otherwise within normal limits. Small bowel is unremarkable. Terminal ileum is within normal limits. The appendix is visualized and normal. Moderate stool is present in the cecum and ascending colon. Transverse colon is within normal limits. Diverticular changes are present in the distal descending and sigmoid colon. Although the area is somewhat obscured by streak artifact, there appears to be some stranding about the sigmoid colon. No definite free air or abscess formation is present. Vascular/Lymphatic:  Atherosclerotic calcifications are present in the aorta and branch vessels without aneurysm. No significant adenopathy is present. No significant adenopathy is present. Reproductive: Prostate is unremarkable. Other: No abdominal wall hernia or abnormality. No abdominopelvic ascites. Musculoskeletal: Bilateral total hip arthroplasty is present. Degenerative changes are noted in the lumbar spine with levoconvex curvature centered at L4-5. Facet degenerative changes are greatest at L4-5 and L5-S1. No focal lytic or blastic lesions are present. IMPRESSION: 1. Diffuse descending and sigmoid diverticulosis with probable inflammatory changes about the sigmoid colon suggesting diverticulitis. The area is somewhat obscured by streak artifact from bilateral hip prostheses. No complicating features are evident. 2. No nephrolithiasis or hydronephrosis. 3.  Aortic Atherosclerosis (ICD10-I70.0). 4. Coronary artery disease. 5. Mild dependent atelectasis at the lung bases bilaterally. Electronically Signed   By: San Morelle M.D.   On: 02/09/2018 16:19    EKG: Independently reviewed, no acute ST changes  Assessment/Plan Present on Admission: . Acute diverticulitis .  Essential hypertension . Addison's disease ([place]) . Rheumatoid arthritis ([place]) . GERD (gastroesophageal reflux disease)  Principal Problem:   Acute diverticulitis Active Problems:   Essential hypertension   Addison's disease ([place])   Rheumatoid arthritis ([facility])   GERD (gastroesophageal reflux disease)  Sepsis 2/2 acute diverticulitis Febrile, leukocytosis, lactic acidosis on presentation LA 2.1-->1.05 status post IV fluids LFTs, lipase pending BC x2 pending UA unremarkable for infection, UC pending Chest x-ray unremarkable CT renal stone done in the ED showed diffuse descending and sigmoid diverticulosis with probable inflammatory changes suggesting diverticulitis.  No free air or abscess formation is present Start IV cefepime, Flagyl Continue IV fluids Clear liquid diet, pain management, antiemetics Monitor closely, if symptoms does not improve, consider CT abdomen/pelvis with contrast Monitor closely  Hypokalemia Replace PRN  Addison's disease ??flare, although blood pressure is stable Will stress dose with IV Solu-Cortef, for 1 day or so to avoid any crisis, may switch back to p.o. once stable Continue Florinef Monitor closely  Hypertension BP stable Hold telmisartan, amlodipine for now  Dysrhythmia Continue digoxin  GERD Continue PPI  Hyperlipidemia Continue statin  Rheumatoid arthritis Continue Plaquenil, folic acid        DVT prophylaxis: Lovenox  Code Status: DNR  Family Communication: Daughter at bedside  Disposition Plan: To be determined  Consults called: None  Admission status: Inpatient    [name] MD [facility]   If 7PM-7AM, please contact night-coverage [email or web address]   02/09/2018, 5:38 PM

## 2018-02-10 LAB — CBC
HEMATOCRIT: 36.8 % — AB (ref 39.0–52.0)
HEMOGLOBIN: 12 g/dL — AB (ref 13.0–17.0)
MCH: 28.5 pg (ref 26.0–34.0)
MCHC: 32.6 g/dL (ref 30.0–36.0)
MCV: 87.4 fL (ref 80.0–100.0)
Platelets: 352 10*3/uL (ref 150–400)
RBC: 4.21 MIL/uL — AB (ref 4.22–5.81)
RDW: 12.1 % (ref 11.5–15.5)
WBC: 12.8 10*3/uL — AB (ref 4.0–10.5)
nRBC: 0 % (ref 0.0–0.2)

## 2018-02-10 LAB — BASIC METABOLIC PANEL
ANION GAP: 11 (ref 5–15)
BUN: 9 mg/dL (ref 8–23)
CHLORIDE: 102 mmol/L (ref 98–111)
CO2: 20 mmol/L — ABNORMAL LOW (ref 22–32)
Calcium: 8 mg/dL — ABNORMAL LOW (ref 8.9–10.3)
Creatinine, Ser: 0.62 mg/dL (ref 0.61–1.24)
GFR calc Af Amer: >60 mL/min (ref >60)
GFR calc non Af Amer: >60 mL/min (ref >60)
Glucose, Bld: 89 mg/dL (ref 70–99)
POTASSIUM: 3.6 mmol/L (ref 3.5–5.1)
Sodium: 133 mmol/L — ABNORMAL LOW (ref 135–145)

## 2018-02-10 LAB — URINE CULTURE: Culture: NO GROWTH

## 2018-02-10 MED ORDER — POLYVINYL ALCOHOL 1.4 % OP SOLN
1.0000 [drp] | Freq: Two times a day (BID) | OPHTHALMIC | Status: DC | PRN
  Filled 2018-02-10: qty 15

## 2018-02-10 MED ORDER — FLUTICASONE PROPIONATE 50 MCG/ACT NA SUSP
1.0000 | Freq: Every day | NASAL | Status: DC
  Administered 2018-02-10 – 2018-02-14 (×4): 1 via NASAL
  Filled 2018-02-10: qty 16

## 2018-02-10 MED ORDER — DM-GUAIFENESIN ER 30-600 MG PO TB12
1.0000 | ORAL_TABLET | Freq: Two times a day (BID) | ORAL | Status: DC
  Administered 2018-02-10 – 2018-02-15 (×7): 1 via ORAL
  Filled 2018-02-10 (×13): qty 1

## 2018-02-10 MED ORDER — LINACLOTIDE 145 MCG PO CAPS
145.0000 ug | ORAL_CAPSULE | Freq: Every day | ORAL | Status: DC
  Administered 2018-02-11 – 2018-02-12 (×2): 145 ug via ORAL
  Filled 2018-02-10 (×3): qty 1

## 2018-02-10 MED ORDER — SENNOSIDES-DOCUSATE SODIUM 8.6-50 MG PO TABS
1.0000 | ORAL_TABLET | Freq: Two times a day (BID) | ORAL | Status: DC
  Administered 2018-02-10 – 2018-02-18 (×15): 1 via ORAL
  Filled 2018-02-10 (×16): qty 1

## 2018-02-10 MED ORDER — POLYETHYLENE GLYCOL 3350 17 G PO PACK
17.0000 g | PACK | Freq: Every day | ORAL | Status: DC
  Administered 2018-02-10: 17 g via ORAL
  Filled 2018-02-10 (×2): qty 1

## 2018-02-10 MED ORDER — MUCINEX DM 30-600 MG PO TB12: 0.5000 | ORAL_TABLET | Freq: Two times a day (BID) | ORAL | Status: DC | PRN

## 2018-02-10 NOTE — Plan of Care (Signed)
  Problem: Education: Goal: Knowledge of General Education information will improve Description: Including pain rating scale, medication(s)/side effects and non-pharmacologic comfort measures Outcome: Progressing   Problem: Pain Managment: Goal: General experience of comfort will improve Outcome: Progressing   Problem: Safety: Goal: Ability to remain free from injury will improve Outcome: Progressing   

## 2018-02-10 NOTE — Progress Notes (Signed)
PROGRESS NOTE  Adian Jablonowski KZS:[id number] DOB: 1945-11-28 DOA: 02/09/2018 PCP: [name], MD  HPI/Recap of past 24 hours: Bobbie [place]letta is a 72 y.o. male with medical history significant for Addison's disease, hypertension, hyperlipidemia, rheumatoid arthritis, diverticulosis, presents to the ER complaining of left lower quadrant/left flank sharp pain, ongoing for the past 2 days, 10/10 in severity at its worse, does not radiate, nothing makes it better or worse.  Patient denies any associated nausea, vomiting, diarrhea.  Noted some darker urine.  Denies any chest pain, shortness of breath, cough, dizziness, lethargy. In the ED, patient noted to have a fever of 101.9, leukocytosis, lactic acidosis.  CT renal stone showed diffuse descending and sigmoid diverticulosis with probable inflammatory changes suggesting diverticulitis. No free air or abscess formation is present.  Patient was given IV antibiotics, IV fluids.  Patient admitted for further management.    Today, pt reported feeling better, pain is controlled with pain meds, noted to spike a temp early this am. Pt denies any N/V, cough, diarrhea, chest pain, SOB.  Assessment/Plan: Principal Problem:   Acute diverticulitis Active Problems:   Essential hypertension   Addison's disease ([place])   Rheumatoid arthritis ([facility])   GERD (gastroesophageal reflux disease)  Sepsis 2/2 acute diverticulitis Febrile, leukocytosis, lactic acidosis on presentation Spiked temp of 102.7 early this am, resolving leukocytosis  LA 2.1-->1.05 status post IV fluids LFTs, WNL except for T.bili 1.7, lipase WNL BC x2 pending UA unremarkable for infection, UC no growth Chest x-ray unremarkable CT renal stone done in the ED showed diffuse descending and sigmoid diverticulosis with probable inflammatory changes suggesting diverticulitis.  No free air or abscess formation is present Continue IV cefepime, Flagyl Continue IV fluids Clear liquid diet, pain  management, antiemetics Monitor closely, if symptoms does not improve, consider CT abdomen/pelvis with contrast  Hypokalemia Replace PRN  Addison's disease ??flare, although blood pressure is stable Continue stress dose with IV Solu-Cortef, for few days for now Continue Florinef Monitor closely  Hypertension BP stable Hold telmisartan, amlodipine for now  Dysrhythmia Continue digoxin  GERD Continue PPI  Hyperlipidemia Continue statin  Rheumatoid arthritis Continue Plaquenil, folic acid    Code Status: DNR  Family Communication: Sister at bedside  Disposition Plan: TBD   Consultants:  None  Procedures:  None   Antimicrobials:  Cefepime  Flagyl   DVT prophylaxis: Lovenox   Objective: Vitals:   02/09/18 2059 02/10/18 0152 02/10/18 0253 02/10/18 0417  BP: (!) 153/77   (!) 102/38  Pulse: 98   95  Resp: (!) 22   16  Temp: 98.1 F (36.7 C) (!) 102.7 F (39.3 C) 99.7 F (37.6 C) 98.9 F (37.2 C)  TempSrc: Oral   Oral  SpO2: 95%   (!) 89%  Weight:      Height:        Intake/Output Summary (Last 24 hours) at 02/10/2018 1258 Last data filed at 02/10/2018 0600 Gross per 24 hour  Intake 2814.89 ml  Output -  Net 2814.89 ml   Filed Weights   02/09/18 1121 02/09/18 1853  Weight: 59 kg 58.7 kg    Exam:   General: NAD   Cardiovascular: S1, S2 present  Respiratory: CTAB   Abdomen: Soft, TTP around L flank/LLQ, ND, BS present  Musculoskeletal: No pedal edema bilaterally, bilateral foot inverted inwards  Skin: Normal  Psychiatry: Normal mood   Data Reviewed: CBC: Recent Labs  Lab 02/09/18 1038 02/10/18 0252  WBC 13.7* 12.8*  HGB 13.6 12.0*  HCT  43.0 36.8*  MCV 90.3 87.4  PLT 375 373   Basic Metabolic Panel: Recent Labs  Lab 02/09/18 1038 02/10/18 0252  NA 136 133*  K 3.2* 3.6  CL 100 102  CO2 24 20*  GLUCOSE 106* 89  BUN 9 9  CREATININE 0.66 0.62  CALCIUM 9.1 8.0*   GFR: Estimated Creatinine  Clearance: 65.2 mL/min (by C-G formula based on SCr of 0.62 mg/dL). Liver Function Tests: Recent Labs  Lab 02/09/18 1915  AST 16  ALT 11  ALKPHOS 64  BILITOT 1.7*  PROT 5.8*  ALBUMIN 3.0*   Recent Labs  Lab 02/09/18 1915  LIPASE 23   No results for input(s): AMMONIA in the last 168 hours. Coagulation Profile: No results for input(s): INR, PROTIME in the last 168 hours. Cardiac Enzymes: No results for input(s): CKTOTAL, CKMB, CKMBINDEX, TROPONINI in the last 168 hours. BNP (last 3 results) No results for input(s): PROBNP in the last 8760 hours. HbA1C: No results for input(s): HGBA1C in the last 72 hours. CBG: No results for input(s): GLUCAP in the last 168 hours. Lipid Profile: No results for input(s): CHOL, HDL, LDLCALC, TRIG, CHOLHDL, LDLDIRECT in the last 72 hours. Thyroid Function Tests: No results for input(s): TSH, T4TOTAL, FREET4, T3FREE, THYROIDAB in the last 72 hours. Anemia Panel: No results for input(s): VITAMINB12, FOLATE, FERRITIN, TIBC, IRON, RETICCTPCT in the last 72 hours. Urine analysis:    Component Value Date/Time   COLORURINE YELLOW 02/09/2018 [place] 02/09/2018 1526   LABSPEC 1.012 02/09/2018 1526   PHURINE 8.0 02/09/2018 1526   GLUCOSEU NEGATIVE 02/09/2018 1526   HGBUR NEGATIVE 02/09/2018 [place] 02/09/2018 1526   KETONESUR 20 (A) 02/09/2018 1526   PROTEINUR NEGATIVE 02/09/2018 1526   UROBILINOGEN 0.2 02/02/2012 1135   NITRITE NEGATIVE 02/09/2018 [place] 02/09/2018 1526   Sepsis Labs: @LABRCNTIP (procalcitonin:4,lacticidven:4)  ) Recent Results (from the past 240 hour(s))  Urine culture     Status: None   Collection Time: 02/09/18  3:26 PM  Result Value Ref Range Status   Specimen Description URINE, CLEAN CATCH  Final   Special Requests NONE  Final   Culture   Final    NO GROWTH Performed at [facility], [place] [address] [place], [place] [address]    Report Status  02/10/2018 FINAL  Final      Studies: No results found.  Scheduled Meds: . ALPRAZolam  1 mg Oral QHS  . dextromethorphan-guaiFENesin  1 tablet Oral BID  . digoxin  0.0625 mg Oral Daily  . enoxaparin (LOVENOX) injection  40 mg Subcutaneous Q24H  . fludrocortisone  0.1 mg Oral Daily  . fluticasone  1 spray Each Nare Daily  . folic acid  811 mcg Oral Daily  . hydrocortisone sod succinate (SOLU-CORTEF) inj  50 mg Intravenous Q8H  . hydroxychloroquine  200 mg Oral Daily  . pantoprazole  40 mg Oral Daily  . pravastatin  20 mg Oral QHS  . sodium chloride flush  3 mL Intravenous Q12H    Continuous Infusions: . sodium chloride    . sodium chloride    . ceFEPime (MAXIPIME) IV 2 g (02/10/18 0909)  . metronidazole 500 mg (02/10/18 0907)     LOS: 1 day     [name], MD [facility]   If 7PM-7AM, please contact night-coverage [email or web address] 02/10/2018, 12:58 PM

## 2018-02-11 ENCOUNTER — Other Ambulatory Visit: Payer: Self-pay

## 2018-02-11 ENCOUNTER — Inpatient Hospital Stay (HOSPITAL_COMMUNITY): Payer: Medicare Other

## 2018-02-11 LAB — CBC WITH DIFFERENTIAL/PLATELET
ABS IMMATURE GRANULOCYTES: 0.06 10*3/uL (ref 0.00–0.07)
BASOS ABS: 0 10*3/uL (ref 0.0–0.1)
Basophils Relative: 0 %
Eosinophils Absolute: 0 10*3/uL (ref 0.0–0.5)
Eosinophils Relative: 0 %
HEMATOCRIT: 34.5 % — AB (ref 39.0–52.0)
HEMOGLOBIN: 11.6 g/dL — AB (ref 13.0–17.0)
Immature Granulocytes: 0 %
Lymphocytes Relative: 6 %
Lymphs Abs: 0.9 10*3/uL (ref 0.7–4.0)
MCH: 28.9 pg (ref 26.0–34.0)
MCHC: 33.6 g/dL (ref 30.0–36.0)
MCV: 86 fL (ref 80.0–100.0)
MONOS PCT: 5 %
Monocytes Absolute: 0.7 10*3/uL (ref 0.1–1.0)
NEUTROS ABS: 12.1 10*3/uL — AB (ref 1.7–7.7)
Neutrophils Relative %: 89 %
PLATELETS: 379 10*3/uL (ref 150–400)
RBC: 4.01 MIL/uL — AB (ref 4.22–5.81)
RDW: 12.1 % (ref 11.5–15.5)
WBC: 13.7 10*3/uL — ABNORMAL HIGH (ref 4.0–10.5)
nRBC: 0 % (ref 0.0–0.2)

## 2018-02-11 LAB — BASIC METABOLIC PANEL
Anion gap: 7 (ref 5–15)
BUN: 11 mg/dL (ref 8–23)
CALCIUM: 7.8 mg/dL — AB (ref 8.9–10.3)
CO2: 21 mmol/L — AB (ref 22–32)
Chloride: 108 mmol/L (ref 98–111)
Creatinine, Ser: 0.47 mg/dL — ABNORMAL LOW (ref 0.61–1.24)
GFR calc Af Amer: >60 mL/min (ref >60)
GFR calc non Af Amer: >60 mL/min (ref >60)
GLUCOSE: 132 mg/dL — AB (ref 70–99)
Potassium: 2.7 mmol/L — CL (ref 3.5–5.1)
Sodium: 136 mmol/L (ref 135–145)

## 2018-02-11 MED ORDER — POTASSIUM CHLORIDE 10 MEQ/100ML IV SOLN
10.0000 meq | INTRAVENOUS | Status: AC
  Administered 2018-02-11 (×6): 10 meq via INTRAVENOUS
  Filled 2018-02-11 (×3): qty 100

## 2018-02-11 MED ORDER — PROMETHAZINE HCL 25 MG PO TABS
12.5000 mg | ORAL_TABLET | Freq: Once | ORAL | Status: AC
  Administered 2018-02-11: 12.5 mg via ORAL
  Filled 2018-02-11: qty 1

## 2018-02-11 MED ORDER — HYDROCORTISONE 20 MG PO TABS
20.0000 mg | ORAL_TABLET | Freq: Every day | ORAL | Status: DC
  Administered 2018-02-12 – 2018-02-18 (×7): 20 mg via ORAL
  Filled 2018-02-11 (×7): qty 1

## 2018-02-11 MED ORDER — BISACODYL 10 MG RE SUPP
10.0000 mg | Freq: Once | RECTAL | Status: AC
  Administered 2018-02-11: 10 mg via RECTAL
  Filled 2018-02-11: qty 1

## 2018-02-11 MED ORDER — POLYETHYLENE GLYCOL 3350 17 G PO PACK
17.0000 g | PACK | Freq: Two times a day (BID) | ORAL | Status: DC
  Administered 2018-02-11 – 2018-02-17 (×8): 17 g via ORAL
  Filled 2018-02-11 (×13): qty 1

## 2018-02-11 MED ORDER — MAGNESIUM CITRATE PO SOLN
1.0000 | Freq: Once | ORAL | Status: AC
  Administered 2018-02-11: 1 via ORAL
  Filled 2018-02-11: qty 296

## 2018-02-11 NOTE — Progress Notes (Signed)
Patient drank about 180 mL of magnesium citrate which I mixed it with miralax, about 60 mL of magnesium citrate remaining on cup. Pt felt bloated but was burping.  Will monitor.

## 2018-02-11 NOTE — Progress Notes (Signed)
PROGRESS NOTE  John Lyons NKN:[id number] DOB: 28-Feb-1946 DOA: 02/09/2018 PCP: [name], MD  HPI/Recap of past 24 hours: John Lyons is a 72 y.o. male with medical history significant for Addison's disease, hypertension, hyperlipidemia, rheumatoid arthritis, diverticulosis, presents to the ER complaining of left lower quadrant/left flank sharp pain, ongoing for the past 2 days, 10/10 in severity at its worse, does not radiate, nothing makes it better or worse.  Patient denies any associated nausea, vomiting, diarrhea.  Noted some darker urine.  Denies any chest pain, shortness of breath, cough, dizziness, lethargy. In the ED, patient noted to have a fever of 101.9, leukocytosis, lactic acidosis.  CT renal stone showed diffuse descending and sigmoid diverticulosis with probable inflammatory changes suggesting diverticulitis. No free air or abscess formation is present.  Patient was given IV antibiotics, IV fluids.  Patient admitted for further management.    Today, pt reported L flank/LLQ pain is slightly improving. Pt reports feeling constipated, bloated with some nausea and vomiting. Pt denies any cough, diarrhea, chest pain, SOB.  Assessment/Plan: Principal Problem:   Acute diverticulitis Active Problems:   Essential hypertension   Addison's disease ([place])   Rheumatoid arthritis ([place])   GERD (gastroesophageal reflux disease)  Sepsis 2/2 acute diverticulitis Febrile, leukocytosis, lactic acidosis on presentation Currently afebrile, with resolving leukocytosis  LA 2.1-->1.05 status post IV fluids LFTs, WNL except for T.bili 1.7, lipase WNL BC x2 NGTD UA unremarkable for infection, UC no growth Chest x-ray unremarkable CT renal stone done in the ED showed diffuse descending and sigmoid diverticulosis with probable inflammatory changes suggesting diverticulitis.  No free air or abscess formation is present Continue IV cefepime, Flagyl Continue IV fluids Clear liquid diet, pain  management, antiemetics Monitor closely, if symptoms does not improve, consider CT abdomen/pelvis with contrast  Hypokalemia Replace PRN  Addison's disease ??flare, although blood pressure is stable S/P IV Solu-Cortef for stress dose Continue Florinef, Cortef Monitor closely  Hypertension BP stable Hold telmisartan, amlodipine for now  Dysrhythmia Continue digoxin  GERD Continue PPI  Hyperlipidemia Continue statin  Rheumatoid arthritis Continue Plaquenil, folic acid    Code Status: DNR  Family Communication: Sister at bedside  Disposition Plan: TBD   Consultants:  None  Procedures:  None   Antimicrobials:  Cefepime  Flagyl   DVT prophylaxis: Lovenox   Objective: Vitals:   02/10/18 0417 02/10/18 2210 02/11/18 0420 02/11/18 1300  BP: (!) 102/38 (!) 152/68 (!) 141/64 (!) 145/81  Pulse: 95 93 91 (!) 102  Resp: 16 16 16 16   Temp: 98.9 F (37.2 C) 99.4 F (37.4 C) 98.3 F (36.8 C) 98.3 F (36.8 C)  TempSrc: Oral Oral Oral Oral  SpO2: (!) 89% 96% 95% 95%  Weight:      Height:        Intake/Output Summary (Last 24 hours) at 02/11/2018 1613 Last data filed at 02/11/2018 0600 Gross per 24 hour  Intake 1540 ml  Output 0 ml  Net 1540 ml   Filed Weights   02/09/18 1121 02/09/18 1853  Weight: 59 kg 58.7 kg    Exam:   General: NAD   Cardiovascular: S1, S2 present  Respiratory: CTAB   Abdomen: Soft, TTP around L flank/LLQ, ND, BS present  Musculoskeletal: No pedal edema bilaterally, bilateral foot inverted inwards  Skin: Normal  Psychiatry: Normal mood   Data Reviewed: CBC: Recent Labs  Lab 02/09/18 1038 02/10/18 0252 02/11/18 0253  WBC 13.7* 12.8* 13.7*  NEUTROABS  --   --  12.1*  HGB 13.6 12.0* 11.6*  HCT 43.0 36.8* 34.5*  MCV 90.3 87.4 86.0  PLT 375 352 176   Basic Metabolic Panel: Recent Labs  Lab 02/09/18 1038 02/10/18 0252 02/11/18 0253  NA 136 133* 136  K 3.2* 3.6 2.7*  CL 100 102 108  CO2 24  20* 21*  GLUCOSE 106* 89 132*  BUN 9 9 11   CREATININE 0.66 0.62 0.47*  CALCIUM 9.1 8.0* 7.8*   GFR: Estimated Creatinine Clearance: 65.2 mL/min (A) (by C-G formula based on SCr of 0.47 mg/dL (L)). Liver Function Tests: Recent Labs  Lab 02/09/18 1915  AST 16  ALT 11  ALKPHOS 64  BILITOT 1.7*  PROT 5.8*  ALBUMIN 3.0*   Recent Labs  Lab 02/09/18 1915  LIPASE 23   No results for input(s): AMMONIA in the last 168 hours. Coagulation Profile: No results for input(s): INR, PROTIME in the last 168 hours. Cardiac Enzymes: No results for input(s): CKTOTAL, CKMB, CKMBINDEX, TROPONINI in the last 168 hours. BNP (last 3 results) No results for input(s): PROBNP in the last 8760 hours. HbA1C: No results for input(s): HGBA1C in the last 72 hours. CBG: No results for input(s): GLUCAP in the last 168 hours. Lipid Profile: No results for input(s): CHOL, HDL, LDLCALC, TRIG, CHOLHDL, LDLDIRECT in the last 72 hours. Thyroid Function Tests: No results for input(s): TSH, T4TOTAL, FREET4, T3FREE, THYROIDAB in the last 72 hours. Anemia Panel: No results for input(s): VITAMINB12, FOLATE, FERRITIN, TIBC, IRON, RETICCTPCT in the last 72 hours. Urine analysis:    Component Value Date/Time   COLORURINE YELLOW 02/09/2018 [place] 02/09/2018 1526   LABSPEC 1.012 02/09/2018 1526   PHURINE 8.0 02/09/2018 1526   GLUCOSEU NEGATIVE 02/09/2018 1526   HGBUR NEGATIVE 02/09/2018 [place] 02/09/2018 1526   KETONESUR 20 (A) 02/09/2018 1526   PROTEINUR NEGATIVE 02/09/2018 1526   UROBILINOGEN 0.2 02/02/2012 1135   NITRITE NEGATIVE 02/09/2018 [employer] 02/09/2018 1526   Sepsis Labs: @LABRCNTIP (procalcitonin:4,lacticidven:4)  ) Recent Results (from the past 240 hour(s))  Blood Culture (routine x 2)     Status: None (Preliminary result)   Collection Time: 02/09/18 11:47 AM  Result Value Ref Range Status   Specimen Description BLOOD RIGHT  ANTECUBITAL  Final   Special Requests   Final    BOTTLES DRAWN AEROBIC AND ANAEROBIC Blood Culture results may not be optimal due to an inadequate volume of blood received in culture bottles   Culture   Final    NO GROWTH 2 DAYS Performed at [facility] [address], [place], [place] [address]    Report Status PENDING  Incomplete  Blood Culture (routine x 2)     Status: None (Preliminary result)   Collection Time: 02/09/18 11:57 AM  Result Value Ref Range Status   Specimen Description BLOOD SITE NOT SPECIFIED  Final   Special Requests   Final    BOTTLES DRAWN AEROBIC AND ANAEROBIC Blood Culture results may not be optimal due to an inadequate volume of blood received in culture bottles   Culture   Final    NO GROWTH 2 DAYS Performed at [facility], [place] [address], [place], [place] [address]    Report Status PENDING  Incomplete  Urine culture     Status: None   Collection Time: 02/09/18  3:26 PM  Result Value Ref Range Status   Specimen Description URINE, CLEAN CATCH  Final   Special Requests NONE  Final   Culture   Final    NO GROWTH Performed at [facility], [place] [address] [place], [place] [address]    Report Status 02/10/2018 FINAL  Final      Studies: No results found.  Scheduled Meds: . ALPRAZolam  1 mg Oral QHS  . dextromethorphan-guaiFENesin  1 tablet Oral BID  . digoxin  0.0625 mg Oral Daily  . enoxaparin (LOVENOX) injection  40 mg Subcutaneous Q24H  . fludrocortisone  0.1 mg Oral Daily  . fluticasone  1 spray Each Nare Daily  . folic acid  604 mcg Oral Daily  . [START ON 02/12/2018] hydrocortisone  20 mg Oral Daily  . hydroxychloroquine  200 mg Oral Daily  . linaclotide  145 mcg Oral QAC breakfast  . pantoprazole  40 mg Oral Daily  . polyethylene glycol  17 g Oral BID  . pravastatin  20 mg Oral QHS  . promethazine  12.5 mg Oral Once  . senna-docusate  1 tablet Oral BID  . sodium chloride flush  3 mL Intravenous Q12H    Continuous  Infusions: . sodium chloride 75 mL/hr at 02/11/18 0845  . sodium chloride    . ceFEPime (MAXIPIME) IV 2 g (02/11/18 1033)  . metronidazole 500 mg (02/11/18 5409)     LOS: 2 days     [name], MD [facility]   If 7PM-7AM, please contact night-coverage [email or web address] 02/11/2018, 4:13 PM

## 2018-02-11 NOTE — Progress Notes (Signed)
Pt vomited twice this pm even after zofran administered

## 2018-02-11 NOTE — Progress Notes (Signed)
CRITICAL VALUE ALERT  Critical Value:  K 2.7  Date & Time Notied:  0440  Provider Notified: yes  Orders Received/Actions taken: orders for 6 runs K given

## 2018-02-11 NOTE — Progress Notes (Signed)
Patient vomited small clear green liquid and administered PRN Zofran 4mg  Inj per order and noted relief.  Advice patient to go slow drinking the magnesium citrate as soon as he will feel not nauseaus anymore.  Will monitor.

## 2018-02-11 NOTE — Plan of Care (Signed)
  Problem: Education: Goal: Knowledge of General Education information will improve Description: Including pain rating scale, medication(s)/side effects and non-pharmacologic comfort measures Outcome: Progressing   Problem: Activity: Goal: Risk for activity intolerance will decrease Outcome: Progressing   Problem: Coping: Goal: Level of anxiety will decrease Outcome: Progressing   

## 2018-02-12 LAB — BASIC METABOLIC PANEL
ANION GAP: 9 (ref 5–15)
BUN: 11 mg/dL (ref 8–23)
CALCIUM: 7.7 mg/dL — AB (ref 8.9–10.3)
CHLORIDE: 108 mmol/L (ref 98–111)
CO2: 21 mmol/L — ABNORMAL LOW (ref 22–32)
CREATININE: 0.54 mg/dL — AB (ref 0.61–1.24)
GFR calc non Af Amer: >60 mL/min (ref >60)
Glucose, Bld: 88 mg/dL (ref 70–99)
Potassium: 2.9 mmol/L — ABNORMAL LOW (ref 3.5–5.1)
SODIUM: 138 mmol/L (ref 135–145)

## 2018-02-12 LAB — CBC WITH DIFFERENTIAL/PLATELET
Abs Immature Granulocytes: 0.05 10*3/uL (ref 0.00–0.07)
BASOS ABS: 0 10*3/uL (ref 0.0–0.1)
Basophils Relative: 0 %
EOS PCT: 0 %
Eosinophils Absolute: 0 10*3/uL (ref 0.0–0.5)
HCT: 37.3 % — ABNORMAL LOW (ref 39.0–52.0)
HEMOGLOBIN: 12.5 g/dL — AB (ref 13.0–17.0)
IMMATURE GRANULOCYTES: 0 %
LYMPHS ABS: 1.9 10*3/uL (ref 0.7–4.0)
LYMPHS PCT: 15 %
MCH: 28.9 pg (ref 26.0–34.0)
MCHC: 33.5 g/dL (ref 30.0–36.0)
MCV: 86.1 fL (ref 80.0–100.0)
Monocytes Absolute: 1 10*3/uL (ref 0.1–1.0)
Monocytes Relative: 8 %
NEUTROS ABS: 9.4 10*3/uL — AB (ref 1.7–7.7)
NEUTROS PCT: 77 %
NRBC: 0 % (ref 0.0–0.2)
Platelets: 444 10*3/uL — ABNORMAL HIGH (ref 150–400)
RBC: 4.33 MIL/uL (ref 4.22–5.81)
RDW: 12.3 % (ref 11.5–15.5)
WBC: 12.4 10*3/uL — AB (ref 4.0–10.5)

## 2018-02-12 MED ORDER — POTASSIUM CHLORIDE 10 MEQ/100ML IV SOLN
10.0000 meq | INTRAVENOUS | Status: AC
  Administered 2018-02-12 (×6): 10 meq via INTRAVENOUS
  Filled 2018-02-12 (×3): qty 100

## 2018-02-12 NOTE — Progress Notes (Signed)
Pharmacy Antibiotic Note  John Lyons is a 72 y.o. male admitted on 02/09/2018 with flank pain and concern for pyelo vs intra-abdominal infection. Pharmacy has been consulted for Cefepime and Flagyl dosing - day #4. SCr stable wnl.  Plan: - Continue Cefepime 2g IV every 12 hours - Continue Flagyl per MD - Will continue to follow renal function, culture results, LOT, and antibiotic de-escalation plans   Height: 5\' 3"  (160 cm) Weight: 129 lb 6.6 oz (58.7 kg) IBW/kg (Calculated) : 56.9  Temp (24hrs), Avg:98.7 F (37.1 C), Min:98.3 F (36.8 C), Max:99.3 F (37.4 C)  Recent Labs  Lab 02/09/18 1038 02/09/18 1104 02/09/18 1353 02/10/18 0252 02/11/18 0253 02/12/18 0401  WBC 13.7*  --   --  12.8* 13.7* 12.4*  CREATININE 0.66  --   --  0.62 0.47* 0.54*  LATICACIDVEN  --  2.01* 1.05  --   --   --     Estimated Creatinine Clearance: 65.2 mL/min (A) (by C-G formula based on SCr of 0.54 mg/dL (L)).    Allergies  Allergen Reactions  . Adhesive [Tape] Rash  . Phenobarbital Rash    Antimicrobials this admission: CTX 10/19 x 1 Cefepime 10/19 >> Flagyl 10/19 >>  Dose adjustments this admission: n/a  Microbiology results: 10/19 BCx >> ngtd 10/19 UCx >> neg  Elicia Lamp, PharmD, BCPS Clinical Pharmacist Clinical phone [phone] Please check AMION for all [place] contact numbers 02/12/2018 11:03 AM

## 2018-02-12 NOTE — Progress Notes (Signed)
PT Cancellation Note  Patient Details Name: John Lyons MRN: [id number] DOB: 02-01-1946   Cancelled Treatment:    Reason Eval/Treat Not Completed: (P) Medical issues which prohibited therapy RN requests Evaluation deferral until tomorrow due to decreased K+ and fever.  [name] B. Migdalia Dk PT, DPT Acute Rehabilitation Services Pager [phone] Office [phone]    [place] 02/12/2018, 2:43 PM

## 2018-02-12 NOTE — Progress Notes (Signed)
Assumed care of patient. Report given via recording. No acute problems noted. At [facility]. Tolerated well. Will continue to monitor.

## 2018-02-12 NOTE — Progress Notes (Signed)
6 runs of iv potassium infused today. 650mg  po tylenol also administered for a low grade fever this pm

## 2018-02-12 NOTE — Progress Notes (Signed)
Patient had a large liquid type brown/green BM and about 100 mL clear/green emesis.  Administered PRN medication Zofran and noted relief.

## 2018-02-12 NOTE — Consult Note (Signed)
[facility] Surgery Consult/Admission Note  John Lyons 28-Sep-1945  [id number].    Requesting MD: Dr. Horris Latino Chief Complaint/Reason for Consult: diverticulitis  HPI:   Pt is a 72 yo male with a hx of addison's disease, RA, HTN who lives at home with his daughter (wife in a SNF) who presented to the ED with complaints of abdominal pain. Pt states he has been treated by his PCP before for episodes of diverticulitis but has never been hospitalized. He states it has been years since he has had an episode. He states he began having constant, non radiating, LLQ abdominal pain that progressively worsened to severe pain. Associated fevers. Nausea and bilious vomiting that began yesterday around noon and last episode of emesis was today at 0400. No other associated symptoms. Last BM was today. He has had loose stools since last Wednesday. He denies blood or mucus in his stool. No increase in pain with BM's. Abdominal pain improved since admission.   Surgical hx includes cholecystectomy in 2017 No anticoagulation therapy  ED course, fever of 101.9, leukocytosis, lactic acidosis. CT renal stone showed diffuse descending and sigmoid diverticulosis with probable inflammatory changes suggesting diverticulitis. No free air or abscess formation is present.  ROS:  Review of Systems  Constitutional: Positive for fever. Negative for chills and diaphoresis.  HENT: Negative for sore throat.   Respiratory: Negative for cough and shortness of breath.   Cardiovascular: Negative for chest pain.  Gastrointestinal: Positive for abdominal pain, nausea and vomiting. Negative for blood in stool, constipation and diarrhea.  Genitourinary: Negative for dysuria.  Skin: Negative for rash.  Neurological: Negative for dizziness, focal weakness and loss of consciousness.  All other systems reviewed and are negative.    Family History  Problem Relation Age of Onset  . Diabetes Mother   . Heart disease Mother    . Testicular cancer Father   . Rheumatic fever Brother     Past Medical History:  Diagnosis Date  . Addison's disease ([place])    takes Florinef and Cortef daily  . Anxiety    takes Xanax at bedtime  . Constipation    takes Linzess daily and Miralax as needed as well as Colace  . Diverticulosis   . Dry skin    on legs  . Dysrhythmia    takes Digoxin daily  . GERD (gastroesophageal reflux disease)    takes Pantoprazole daily  . H/O hiatal hernia   . History of blood transfusion   . History of colon polyps    benign  . Hyperlipidemia    takes Pravastatin daily  . Hypertension    takes Amlodipine and Micardis daily  . Joint pain   . Joint swelling   . PONV (postoperative nausea and vomiting)   . Rheumatoid arthritis(714.0)    Rheumatoid-takes Reclast yearly and Plaquenil daily  . Weakness    right hand.Knot on right hand,states came up 2 wks ago,=    Past Surgical History:  Procedure Laterality Date  . CARDIOVASCULAR STRESS TEST  01/04/12   Dr. Gwenlyn Found [facility]  . CHOLECYSTECTOMY N/A 08/12/2015   Procedure: LAPAROSCOPIC CHOLECYSTECTOMY;  Surgeon: Mickeal Skinner, MD;  Location: [facility];  Service: General;  Laterality: N/A;  . COLONOSCOPY    . COLONOSCOPY WITH ESOPHAGOGASTRODUODENOSCOPY (EGD) AND ESOPHAGEAL DILATION (ED)    . EYE SURGERY     cataracts  . SINUS ENDO WITH FUSION Bilateral 01/01/2018   Procedure: BILATERAL TURBINATE REDUCTION, BILATERAL TOTAL ENDOSCOPIC ETHOMIDECTOMY AND BILATERAL ENDOSCOPIC  MAXILLARY ANTROSTOMY WITH TISSUE REMOVAL WITH FUSION;  Surgeon: Leta Baptist, MD;  Location: [facility];  Service: ENT;  Laterality: Bilateral;  . TOTAL HIP ARTHROPLASTY  1978/1979   bilat  . TOTAL HIP ARTHROPLASTY  11/01/2015   REVISION OF LEFT TOTAL HIP ARTHROPLASTY WITH BOTH ACETABULAR AND FEMORAL COMPONENTS POSTERIOR APPROACH (Left) - Requesting RNFA  . TOTAL HIP REVISION Left 11/01/2015   Procedure: REVISION OF LEFT TOTAL HIP ARTHROPLASTY  WITH BOTH ACETABULAR AND FEMORAL COMPONENTS POSTERIOR APPROACH;  Surgeon: Rod Can, MD;  Location: [facility];  Service: Orthopedics;  Laterality: Left;  Requesting RNFA  . TOTAL KNEE ARTHROPLASTY  1984   Left   . TOTAL KNEE ARTHROPLASTY  02/12/2012   right  . TOTAL KNEE ARTHROPLASTY  02/12/2012   Procedure: TOTAL KNEE ARTHROPLASTY;  Surgeon: Rudean Haskell, MD;  Location: [facility];  Service: Orthopedics;  Laterality: Right;  . TURBINATE REDUCTION Bilateral 01/01/2018   Procedure: BILATERAL TURBINATE REDUCTION;  Surgeon: Leta Baptist, MD;  Location: [facility];  Service: ENT;  Laterality: Bilateral;  . VASECTOMY  1979    Social History:  reports that he has never smoked. He has never used smokeless tobacco. He reports that he does not drink alcohol or use drugs.  Allergies:  Allergies  Allergen Reactions  . Adhesive [Tape] Rash  . Phenobarbital Rash    Medications Prior to Admission  Medication Sig Dispense Refill  . acetaminophen (TYLENOL) 500 MG tablet Take 1,000 mg by mouth every 6 (six) hours as needed (for pain).    Marland Kitchen ALPRAZolam (XANAX) 0.25 MG tablet Take 0.75-1 mg by mouth at bedtime.     Marland Kitchen amLODipine (NORVASC) 2.5 MG tablet Take 2.5 mg by mouth daily with supper.     . Cholecalciferol (VITAMIN D3) 1000 UNITS CAPS Take 1,000 Units by mouth daily.     Marland Kitchen Dextromethorphan-guaiFENesin (MUCINEX DM) 30-600 MG TB12 Take 0.5-1 tablets by mouth 2 (two) times daily as needed (for coughing/congestion).    Marland Kitchen digoxin (LANOXIN) 0.125 MG tablet Take 0.0625 mg by mouth daily.     . fludrocortisone (FLORINEF) 0.1 MG tablet Take 0.1 mg by mouth daily.     . folic acid (FOLVITE) 458 MCG tablet Take 400 mcg by mouth daily.    . hydrocortisone (CORTEF) 20 MG tablet Take 20 mg by mouth daily.     . hydroxychloroquine (PLAQUENIL) 200 MG tablet Take 200 mg by mouth daily.    Marland Kitchen linaclotide (LINZESS) 145 MCG CAPS capsule Take 145 mcg by mouth daily as needed (constipation).    . Multiple  Vitamin (MULTIVITAMIN WITH MINERALS) TABS Take 1 tablet by mouth daily.    . pantoprazole (PROTONIX) 40 MG tablet Take 40 mg by mouth daily.    . polyethylene glycol powder (GLYCOLAX/MIRALAX) powder Take 17 g by mouth daily as needed for mild constipation.    . pravastatin (PRAVACHOL) 20 MG tablet Take 20 mg by mouth at bedtime.     Marland Kitchen Propylene Glycol-Glycerin (SOOTHE OP) Place 1-2 drops into both eyes 2 (two) times daily as needed (for dryness or irritation).    Marland Kitchen telmisartan (MICARDIS) 80 MG tablet Take 80 mg by mouth daily.    . traMADol (ULTRAM) 50 MG tablet Take 50 mg by mouth every 12 (twelve) hours as needed for moderate pain.    Marland Kitchen oxyCODONE-acetaminophen (PERCOCET) 5-325 MG tablet Take 1 tablet by mouth every 4 (four) hours as needed for severe pain. (Patient not taking: Reported on 02/09/2018) 20 tablet  0    Blood pressure 140/65, pulse 89, temperature 98.4 F (36.9 C), temperature source Oral, resp. rate 16, height _0  (1.6 m), weight 58.7 kg, SpO2 95 %.  Physical Exam  Constitutional: He is oriented to person, place, and time. He appears well-developed and well-nourished. No distress.  HENT:  Head: Normocephalic and atraumatic.  Nose: Nose normal.  Mouth/Throat: Oropharynx is clear and moist. No oropharyngeal exudate.  Eyes: Pupils are equal, round, and reactive to light. Conjunctivae are normal. Right eye exhibits no discharge. Left eye exhibits no discharge. No scleral icterus.  Neck: Normal range of motion. Neck supple. No thyromegaly present.  Cardiovascular: Normal rate, regular rhythm, normal heart sounds and intact distal pulses.  No murmur heard. Pulses:      Radial pulses are 2+ on the right side, and 2+ on the left side.  Pulmonary/Chest: Effort normal and breath sounds normal. No respiratory distress. He has no wheezes. He has no rhonchi. He has no rales.  Abdominal: Soft. Normal appearance and bowel sounds are normal. He exhibits no distension. There is no  hepatosplenomegaly. There is generalized tenderness (mild). There is no rigidity and no guarding.  Musculoskeletal: Normal range of motion. He exhibits no edema or tenderness.  Lymphadenopathy:    He has no cervical adenopathy.  Neurological: He is alert and oriented to person, place, and time.  Skin: Skin is warm and dry. No rash noted. He is not diaphoretic.  Psychiatric: He has a normal mood and affect.  Nursing note and vitals reviewed.   Results for orders placed or performed during the hospital encounter of 02/09/18 (from the past 48 hour(s))  CBC with Differential/Platelet     Status: Abnormal   Collection Time: 02/11/18  2:53 AM  Result Value Ref Range   WBC 13.7 (H) 4.0 - 10.5 K/uL   RBC 4.01 (L) 4.22 - 5.81 MIL/uL   Hemoglobin 11.6 (L) 13.0 - 17.0 g/dL   HCT 34.5 (L) 39.0 - 52.0 %   MCV 86.0 80.0 - 100.0 fL   MCH 28.9 26.0 - 34.0 pg   MCHC 33.6 30.0 - 36.0 g/dL   RDW 12.1 11.5 - 15.5 %   Platelets 379 150 - 400 K/uL   nRBC 0.0 0.0 - 0.2 %   Neutrophils Relative % 89 %   Neutro Abs 12.1 (H) 1.7 - 7.7 K/uL   Lymphocytes Relative 6 %   Lymphs Abs 0.9 0.7 - 4.0 K/uL   Monocytes Relative 5 %   Monocytes Absolute 0.7 0.1 - 1.0 K/uL   Eosinophils Relative 0 %   Eosinophils Absolute 0.0 0.0 - 0.5 K/uL   Basophils Relative 0 %   Basophils Absolute 0.0 0.0 - 0.1 K/uL   Immature Granulocytes 0 %   Abs Immature Granulocytes 0.06 0.00 - 0.07 K/uL    Comment: Performed at [facility], [address], [place], [place] [address]  Basic metabolic panel     Status: Abnormal   Collection Time: 02/11/18  2:53 AM  Result Value Ref Range   Sodium 136 135 - 145 mmol/L   Potassium 2.7 (LL) 3.5 - 5.1 mmol/L    Comment: CRITICAL RESULT CALLED TO, READ BACK BY AND VERIFIED WITH: L BLANCHARD,RN [id number] WILDERK    Chloride 108 98 - 111 mmol/L   CO2 21 (L) 22 - 32 mmol/L   Glucose, Bld 132 (H) 70 - 99 mg/dL   BUN 11 8 - 23 mg/dL   Creatinine, Ser 0.47 (L)  0.61 - 1.24 mg/dL    Calcium 7.8 (L) 8.9 - 10.3 mg/dL   GFR calc non Af Amer >60 >60 mL/min   GFR calc Af Amer >60 >60 mL/min    Comment: (NOTE) The eGFR has been calculated using the CKD EPI equation. This calculation has not been validated in all clinical situations. eGFR's persistently <60 mL/min signify possible Chronic Kidney Disease.    Anion gap 7 5 - 15    Comment: Performed at [facility] [address], [place], [place] [address]  CBC with Differential/Platelet     Status: Abnormal   Collection Time: 02/12/18  4:01 AM  Result Value Ref Range   WBC 12.4 (H) 4.0 - 10.5 K/uL   RBC 4.33 4.22 - 5.81 MIL/uL   Hemoglobin 12.5 (L) 13.0 - 17.0 g/dL   HCT 37.3 (L) 39.0 - 52.0 %   MCV 86.1 80.0 - 100.0 fL   MCH 28.9 26.0 - 34.0 pg   MCHC 33.5 30.0 - 36.0 g/dL   RDW 12.3 11.5 - 15.5 %   Platelets 444 (H) 150 - 400 K/uL   nRBC 0.0 0.0 - 0.2 %   Neutrophils Relative % 77 %   Neutro Abs 9.4 (H) 1.7 - 7.7 K/uL   Lymphocytes Relative 15 %   Lymphs Abs 1.9 0.7 - 4.0 K/uL   Monocytes Relative 8 %   Monocytes Absolute 1.0 0.1 - 1.0 K/uL   Eosinophils Relative 0 %   Eosinophils Absolute 0.0 0.0 - 0.5 K/uL   Basophils Relative 0 %   Basophils Absolute 0.0 0.0 - 0.1 K/uL   Immature Granulocytes 0 %   Abs Immature Granulocytes 0.05 0.00 - 0.07 K/uL    Comment: Performed at [facility], [place] [address] [place], [place] [address]  Basic metabolic panel     Status: Abnormal   Collection Time: 02/12/18  4:01 AM  Result Value Ref Range   Sodium 138 135 - 145 mmol/L   Potassium 2.9 (L) 3.5 - 5.1 mmol/L   Chloride 108 98 - 111 mmol/L   CO2 21 (L) 22 - 32 mmol/L   Glucose, Bld 88 70 - 99 mg/dL   BUN 11 8 - 23 mg/dL   Creatinine, Ser 0.54 (L) 0.61 - 1.24 mg/dL   Calcium 7.7 (L) 8.9 - 10.3 mg/dL   GFR calc non Af Amer >60 >60 mL/min   GFR calc Af Amer >60 >60 mL/min    Comment: (NOTE) The eGFR has been calculated using the CKD EPI equation. This calculation has not been validated in all  clinical situations. eGFR's persistently <60 mL/min signify possible Chronic Kidney Disease.    Anion gap 9 5 - 15    Comment: Performed at [facility] [address], [place], [place] [address]   Dg Abd Portable 2v  Result Date: 02/11/2018 CLINICAL DATA:  Severe constipation EXAM: PORTABLE ABDOMEN - 2 VIEW COMPARISON:  CT 02/09/2018, pelvis radiograph 11/01/2015 FINDINGS: Patchy atelectasis at the left base with possible tiny left effusion. Development of dilated small bowel up to 3.2 cm with increased gaseous enlargement of right lower quadrant small and large bowel. Some distal colon gas is present. There are bilateral hip replacements. No definite free air beneath the diaphragm. IMPRESSION: Interim development of dilated small and large bowel since previous CT with prominent gas-filled loops of bowel in the right lower quadrant. Findings could be secondary to an ileus, however partial bowel obstruction is also a concern.  Electronically Signed   By: Donavan Foil M.D.   On: 02/11/2018 17:19      Assessment/Plan Principal Problem:   Acute diverticulitis Active Problems:   Essential hypertension   Addison's disease ([place])   Rheumatoid arthritis (HCC)   GERD (gastroesophageal reflux disease)  Diverticulitis  - bowel rest, IVF, IV antibiotics - ice chips only in setting of emesis - if pt continues to have emesis he will need an NGT  FEN: NPO, ice chips VTE: SCD's, lovenox ID: Flagyl 10/20>>  Maxipime 10/19>> Foley: none Follow up: TBD  Plan: medical management. Ideally this will resolve without the need for surgical intervention. We will follow. Thank you for the consult.   Kalman Drape, [facility] Surgery 02/12/2018, 10:56 AM Pager: [phone] Consults: [phone] Mon-Fri 7:00 am-4:30 pm Sat-Sun 7:00 am-11:30 am

## 2018-02-12 NOTE — Care Management Important Message (Signed)
Important Message  Patient Details  Name: John Lyons MRN: [id number] Date of Birth: [birth year]   Medicare Important Message Given:  Yes    [name] 02/12/2018, 12:36 PM

## 2018-02-12 NOTE — Progress Notes (Signed)
PROGRESS NOTE  John Lyons MWU:[id number] DOB: 1945-10-05 DOA: 02/09/2018 PCP: John Infante, MD  HPI/Recap of past 24 hours: John Lyons is a 72 y.o. male with medical history significant for Addison's disease, hypertension, hyperlipidemia, rheumatoid arthritis, diverticulosis, presents to the ER complaining of left lower quadrant/left flank sharp pain, ongoing for the past 2 days, 10/10 in severity at its worse, does not radiate, nothing makes it better or worse.  Patient denies any associated nausea, vomiting, diarrhea.  Noted some darker urine.  Denies any chest pain, shortness of breath, cough, dizziness, lethargy. In the ED, patient noted to have a fever of 101.9, leukocytosis, lactic acidosis.  CT renal stone showed diffuse descending and sigmoid diverticulosis with probable inflammatory changes suggesting diverticulitis. No free air or abscess formation is present.  Patient was given IV antibiotics, IV fluids.  Patient admitted for further management.    Overnight, pt was noted to have nausea with multiple episodes of emesis. Pt reports improved abdominal pain. Also had about 2 episodes of loose stools as well. This am continues to have emesis. Reports chronic back pain. No fever/chills noted, denies any chest pain, SOB.  Assessment/Plan: Principal Problem:   Acute diverticulitis Active Problems:   Essential hypertension   Addison's disease ([place])   Rheumatoid arthritis ([place])   GERD (gastroesophageal reflux disease)  Sepsis 2/2 acute diverticulitis Febrile, leukocytosis, lactic acidosis on presentation Currently afebrile, with resolving leukocytosis  LA 2.1-->1.05 status post IV fluids LFTs, WNL except for T.bili 1.7, lipase WNL BC x2 NGTD UA unremarkable for infection, UC no growth Chest x-ray unremarkable CT renal stone done in the ED showed diffuse descending and sigmoid diverticulosis with probable inflammatory changes suggesting diverticulitis.  No free air or abscess  formation is present Continue IV cefepime, Flagyl Continue IV fluids, NPO Pain management, antiemetics  Possible partial bowel obstruction Vs ileus Nausea, vomiting, started on 02/11/18 Abdominal xray showed Interim development of dilated small and large bowel since previous CT with prominent gas-filled loops of bowel in the RLL. Findings could be secondary to an ileus, however partial bowel obstruction is also a concern General surgery consulted NPO, IVF, may need NGT if vomiting persists  Hypokalemia Replace PRN  Addison's disease BP is stable S/P IV Solu-Cortef for stress dose Continue Florinef, Cortef Monitor closely  Hypertension BP stable Hold telmisartan, amlodipine for now  Dysrhythmia Continue digoxin  GERD Continue PPI  Hyperlipidemia Held statin for now  Rheumatoid arthritis Continue Plaquenil, folic acid    Code Status: DNR  Family Communication: None at bedside  Disposition Plan: TBD   Consultants:  General surgery  Procedures:  None   Antimicrobials:  Cefepime  Flagyl   DVT prophylaxis: Lovenox   Objective: Vitals:   02/11/18 0420 02/11/18 1300 02/11/18 2139 02/12/18 0452  BP: (!) 141/64 (!) 145/81 (!) 148/70 140/65  Pulse: 91 (!) 102 94 89  Resp: 16 16 16 16   Temp: 98.3 F (36.8 C) 98.3 F (36.8 C) 99.3 F (37.4 C) 98.4 F (36.9 C)  TempSrc: Oral Oral Oral Oral  SpO2: 95% 95% 98% 95%  Weight:      Height:        Intake/Output Summary (Last 24 hours) at 02/12/2018 1308 Last data filed at 02/12/2018 0930 Gross per 24 hour  Intake 2299.73 ml  Output 800 ml  Net 1499.73 ml   Filed Weights   02/09/18 1121 02/09/18 1853  Weight: 59 kg 58.7 kg    Exam:   General: NAD   Cardiovascular: S1,  S2 present  Respiratory: CTAB   Abdomen: Soft, mild generalized tenderness, ND, BS hyperactive  Musculoskeletal: No pedal edema bilaterally, bilateral foot inverted inwards  Skin: Normal  Psychiatry: Normal  mood   Data Reviewed: CBC: Recent Labs  Lab 02/09/18 1038 02/10/18 0252 02/11/18 0253 02/12/18 0401  WBC 13.7* 12.8* 13.7* 12.4*  NEUTROABS  --   --  12.1* 9.4*  HGB 13.6 12.0* 11.6* 12.5*  HCT 43.0 36.8* 34.5* 37.3*  MCV 90.3 87.4 86.0 86.1  PLT 375 352 379 017*   Basic Metabolic Panel: Recent Labs  Lab 02/09/18 1038 02/10/18 0252 02/11/18 0253 02/12/18 0401  NA 136 133* 136 138  K 3.2* 3.6 2.7* 2.9*  CL 100 102 108 108  CO2 24 20* 21* 21*  GLUCOSE 106* 89 132* 88  BUN 9 9 11 11   CREATININE 0.66 0.62 0.47* 0.54*  CALCIUM 9.1 8.0* 7.8* 7.7*   GFR: Estimated Creatinine Clearance: 65.2 mL/min (A) (by C-G formula based on SCr of 0.54 mg/dL (L)). Liver Function Tests: Recent Labs  Lab 02/09/18 1915  AST 16  ALT 11  ALKPHOS 64  BILITOT 1.7*  PROT 5.8*  ALBUMIN 3.0*   Recent Labs  Lab 02/09/18 1915  LIPASE 23   No results for input(s): AMMONIA in the last 168 hours. Coagulation Profile: No results for input(s): INR, PROTIME in the last 168 hours. Cardiac Enzymes: No results for input(s): CKTOTAL, CKMB, CKMBINDEX, TROPONINI in the last 168 hours. BNP (last 3 results) No results for input(s): PROBNP in the last 8760 hours. HbA1C: No results for input(s): HGBA1C in the last 72 hours. CBG: No results for input(s): GLUCAP in the last 168 hours. Lipid Profile: No results for input(s): CHOL, HDL, LDLCALC, TRIG, CHOLHDL, LDLDIRECT in the last 72 hours. Thyroid Function Tests: No results for input(s): TSH, T4TOTAL, FREET4, T3FREE, THYROIDAB in the last 72 hours. Anemia Panel: No results for input(s): VITAMINB12, FOLATE, FERRITIN, TIBC, IRON, RETICCTPCT in the last 72 hours. Urine analysis:    Component Value Date/Time   COLORURINE YELLOW 02/09/2018 [place] 02/09/2018 1526   LABSPEC 1.012 02/09/2018 1526   PHURINE 8.0 02/09/2018 1526   GLUCOSEU NEGATIVE 02/09/2018 1526   HGBUR NEGATIVE 02/09/2018 [place] 02/09/2018  1526   KETONESUR 20 (A) 02/09/2018 1526   PROTEINUR NEGATIVE 02/09/2018 1526   UROBILINOGEN 0.2 02/02/2012 1135   NITRITE NEGATIVE 02/09/2018 [place] 02/09/2018 1526   Sepsis Labs: @LABRCNTIP (procalcitonin:4,lacticidven:4)  ) Recent Results (from the past 240 hour(s))  Blood Culture (routine x 2)     Status: None (Preliminary result)   Collection Time: 02/09/18 11:47 AM  Result Value Ref Range Status   Specimen Description BLOOD RIGHT ANTECUBITAL  Final   Special Requests   Final    BOTTLES DRAWN AEROBIC AND ANAEROBIC Blood Culture results may not be optimal due to an inadequate volume of blood received in culture bottles   Culture   Final    NO GROWTH 3 DAYS Performed at [facility], [place] [address], [place], [place] [address]    Report Status PENDING  Incomplete  Blood Culture (routine x 2)     Status: None (Preliminary result)   Collection Time: 02/09/18 11:57 AM  Result Value Ref Range Status   Specimen Description BLOOD SITE NOT SPECIFIED  Final   Special Requests   Final    BOTTLES DRAWN AEROBIC AND ANAEROBIC Blood Culture results may not be optimal due to an  inadequate volume of blood received in culture bottles   Culture   Final    NO GROWTH 3 DAYS Performed at [facility], [place] [address] [place], [place] [address]    Report Status PENDING  Incomplete  Urine culture     Status: None   Collection Time: 02/09/18  3:26 PM  Result Value Ref Range Status   Specimen Description URINE, CLEAN CATCH  Final   Special Requests NONE  Final   Culture   Final    NO GROWTH Performed at [facility], [place] [address] [place], [place] [address]    Report Status 02/10/2018 FINAL  Final      Studies: Dg Abd Portable 2v  Result Date: 02/11/2018 CLINICAL DATA:  Severe constipation EXAM: PORTABLE ABDOMEN - 2 VIEW COMPARISON:  CT 02/09/2018, pelvis radiograph 11/01/2015 FINDINGS: Patchy atelectasis at the left base with possible tiny left  effusion. Development of dilated small bowel up to 3.2 cm with increased gaseous enlargement of right lower quadrant small and large bowel. Some distal colon gas is present. There are bilateral hip replacements. No definite free air beneath the diaphragm. IMPRESSION: Interim development of dilated small and large bowel since previous CT with prominent gas-filled loops of bowel in the right lower quadrant. Findings could be secondary to an ileus, however partial bowel obstruction is also a concern. Electronically Signed   By: Donavan Foil M.D.   On: 02/11/2018 17:19    Scheduled Meds: . ALPRAZolam  1 mg Oral QHS  . dextromethorphan-guaiFENesin  1 tablet Oral BID  . digoxin  0.0625 mg Oral Daily  . enoxaparin (LOVENOX) injection  40 mg Subcutaneous Q24H  . fludrocortisone  0.1 mg Oral Daily  . fluticasone  1 spray Each Nare Daily  . folic acid  488 mcg Oral Daily  . hydrocortisone  20 mg Oral Daily  . hydroxychloroquine  200 mg Oral Daily  . linaclotide  145 mcg Oral QAC breakfast  . pantoprazole  40 mg Oral Daily  . polyethylene glycol  17 g Oral BID  . pravastatin  20 mg Oral QHS  . senna-docusate  1 tablet Oral BID  . sodium chloride flush  3 mL Intravenous Q12H    Continuous Infusions: . sodium chloride 75 mL/hr at 02/12/18 0642  . sodium chloride    . ceFEPime (MAXIPIME) IV 2 g (02/12/18 1114)  . metronidazole 500 mg (02/12/18 1103)  . potassium chloride 10 mEq (02/12/18 1140)     LOS: 3 days     [name], MD [facility]   If 7PM-7AM, please contact night-coverage [email or web address] 02/12/2018, 1:08 PM

## 2018-02-13 LAB — CBC WITH DIFFERENTIAL/PLATELET
ABS IMMATURE GRANULOCYTES: 0.03 10*3/uL (ref 0.00–0.07)
BASOS ABS: 0 10*3/uL (ref 0.0–0.1)
BASOS PCT: 1 %
Eosinophils Absolute: 0.1 10*3/uL (ref 0.0–0.5)
Eosinophils Relative: 1 %
HCT: 34 % — ABNORMAL LOW (ref 39.0–52.0)
Hemoglobin: 10.9 g/dL — ABNORMAL LOW (ref 13.0–17.0)
Immature Granulocytes: 0 %
LYMPHS PCT: 18 %
Lymphs Abs: 1.5 10*3/uL (ref 0.7–4.0)
MCH: 28.2 pg (ref 26.0–34.0)
MCHC: 32.1 g/dL (ref 30.0–36.0)
MCV: 88.1 fL (ref 80.0–100.0)
MONO ABS: 0.6 10*3/uL (ref 0.1–1.0)
Monocytes Relative: 8 %
NEUTROS PCT: 72 %
NRBC: 0 % (ref 0.0–0.2)
Neutro Abs: 5.9 10*3/uL (ref 1.7–7.7)
PLATELETS: 373 10*3/uL (ref 150–400)
RBC: 3.86 MIL/uL — AB (ref 4.22–5.81)
RDW: 12.5 % (ref 11.5–15.5)
WBC: 8.2 10*3/uL (ref 4.0–10.5)

## 2018-02-13 LAB — BASIC METABOLIC PANEL
Anion gap: 7 (ref 5–15)
BUN: 16 mg/dL (ref 8–23)
CO2: 17 mmol/L — ABNORMAL LOW (ref 22–32)
CREATININE: 0.56 mg/dL — AB (ref 0.61–1.24)
Calcium: 7.3 mg/dL — ABNORMAL LOW (ref 8.9–10.3)
Chloride: 115 mmol/L — ABNORMAL HIGH (ref 98–111)
Glucose, Bld: 72 mg/dL (ref 70–99)
POTASSIUM: 3.4 mmol/L — AB (ref 3.5–5.1)
SODIUM: 139 mmol/L (ref 135–145)

## 2018-02-13 LAB — MAGNESIUM: MAGNESIUM: 2 mg/dL (ref 1.7–2.4)

## 2018-02-13 MED ORDER — SACCHAROMYCES BOULARDII 250 MG PO CAPS
250.0000 mg | ORAL_CAPSULE | Freq: Two times a day (BID) | ORAL | Status: DC
  Administered 2018-02-13 – 2018-02-18 (×11): 250 mg via ORAL
  Filled 2018-02-13 (×11): qty 1

## 2018-02-13 NOTE — Progress Notes (Signed)
Patient Demographics:    John Lyons, is a 72 y.o. male, DOB - 06/06/1945, GYK:[id number]  Admit date - 02/09/2018   Admitting Physician [name], MD  Outpatient Primary MD for the patient is John Infante, MD  LOS - 4   Chief Complaint  Patient presents with  . Flank Pain        Subjective:    John Lyons today has no fevers, no emesis,  No chest pain,  No further diarrhea, hungry and wants to eat   Assessment  & Plan :    Principal Problem:   Acute diverticulitis Active Problems:   Essential hypertension   Addison's disease ([place])   Rheumatoid arthritis ([place])   GERD (gastroesophageal reflux disease)  Brief summary  72 y.o. male with medical history significant for Addison's disease, hypertension, hyperlipidemia, rheumatoid arthritis, diverticulosis admitted on 02/09/2018 with abdominal pain and fevers and clinical and imaging findings consistent with diverticulitis. Patient met sepsis criteria with fevers, leukocytosis and lactic acidosis.  Urine and Blood culture negative  Plan:- 1)Sepsis 2/2 acute diverticulitis-  clinically improving, no further fevers, WBC within normal limits, lactic acid previously normalized,, blood cultures negative, treat empirically with IV Zosyn started on 02/13/2018 plan to to discharge home on p.o. Augmentin, give probiotic PRN pain medications and antiemetics,  2)Possible partial bowel obstruction Vs ileus-  resolved emesis, diarrhea appears to be resolving as well , give clear liquid diet, surgical input appreciated, may advance diet as tolerated per surgical team, hold Linzess  3)FEN/Hypokalemia----replace and recheck, decrease IV fluid count of 14 melanoma, advance diet as above #2  4)Addison's disease- BP is stable, patient received stress dose of Solu-Cortef, continue Florinef 0.1 mg daily, and hydrocortisone 20 mg daily ,, hemodynamically  stable  5)Hypertension- stable, continue to hold amlodipine and telmisartan for now  6)Dysrhythmia-stable, asymptomatic, continue digoxin  7)Rheumatoid arthritis-  Continue Plaquenil andfolic acid   Code Status: DNR  Family Communication:  Male relative at bedside  Disposition Plan:  Home in 1 to 2 days if tolerating oral intake   Consultants: General surgery/dietitian  Procedures:  None   Antimicrobials:  Cefepime- stopped 02/13/18  Flagyl -stopped 02/13/18  Zosyn started 02/13/18  Disposition/Need for in-Hospital Stay- patient unable to be discharged at this time due to poor oral intake, nausea vomiting and diarrhea, with risk for dehydration kidney injury without IV fluids, possible discharge in 1 to 2 days if tolerating oral intake well and GI losses (V/D) improves  DVT Prophylaxis  :  Lovenox  Lab Results  Component Value Date   PLT 373 02/13/2018    Inpatient Medications  Scheduled Meds: . ALPRAZolam  1 mg Oral QHS  . dextromethorphan-guaiFENesin  1 tablet Oral BID  . digoxin  0.0625 mg Oral Daily  . enoxaparin (LOVENOX) injection  40 mg Subcutaneous Q24H  . fludrocortisone  0.1 mg Oral Daily  . fluticasone  1 spray Each Nare Daily  . folic acid  793 mcg Oral Daily  . hydrocortisone  20 mg Oral Daily  . hydroxychloroquine  200 mg Oral Daily  . linaclotide  145 mcg Oral QAC breakfast  . pantoprazole  40 mg Oral Daily  . polyethylene glycol  17 g Oral BID  . saccharomyces boulardii  250  mg Oral BID  . senna-docusate  1 tablet Oral BID  . sodium chloride flush  3 mL Intravenous Q12H   Continuous Infusions: . sodium chloride 75 mL/hr at 02/13/18 0639  . sodium chloride     PRN Meds:.sodium chloride, acetaminophen **OR** acetaminophen, albuterol, hydrALAZINE, morphine injection, ondansetron **OR** ondansetron (ZOFRAN) IV, oxyCODONE, polyvinyl alcohol, sodium chloride flush    Anti-infectives (From admission, onward)   Start      Dose/Rate Route Frequency Ordered Stop   02/10/18 0000  metroNIDAZOLE (FLAGYL) IVPB 500 mg  Status:  Discontinued     500 mg 100 mL/hr over 60 Minutes Intravenous Every 8 hours 02/09/18 1737 02/13/18 0813   02/09/18 2200  ceFEPIme (MAXIPIME) 2 g in sodium chloride 0.9 % 100 mL IVPB  Status:  Discontinued     2 g 200 mL/hr over 30 Minutes Intravenous Every 12 hours 02/09/18 1811 02/13/18 0813   02/09/18 1745  hydroxychloroquine (PLAQUENIL) tablet 200 mg     200 mg Oral Daily 02/09/18 1744     02/09/18 1645  metroNIDAZOLE (FLAGYL) IVPB 500 mg     500 mg 100 mL/hr over 60 Minutes Intravenous  Once 02/09/18 1641 02/09/18 1827   02/09/18 1115  cefTRIAXone (ROCEPHIN) 1 g in sodium chloride 0.9 % 100 mL IVPB  Status:  Discontinued     1 g 200 mL/hr over 30 Minutes Intravenous Every 24 hours 02/09/18 1112 02/09/18 1811        Objective:   Vitals:   02/12/18 1439 02/12/18 2009 02/13/18 0350 02/13/18 1421  BP: (!) 158/67 (!) 164/72 129/61 (!) 162/59  Pulse: 93 85 80 82  Resp: '18 17 18 18  '$ Temp: 99.4 F (37.4 C) 98.7 F (37.1 C) 97.7 F (36.5 C) 99.3 F (37.4 C)  TempSrc: Oral Oral Oral Oral  SpO2: 98% 98% 96% 100%  Weight:      Height:        Wt Readings from Last 3 Encounters:  02/09/18 58.7 kg  01/01/18 59.2 kg  11/15/15 59 kg     Intake/Output Summary (Last 24 hours) at 02/13/2018 1622 Last data filed at 02/13/2018 4158 Gross per 24 hour  Intake 2521.95 ml  Output -  Net 2521.95 ml     Physical Exam Patient is examined daily including today on 02/13/18 , exams remain the same as of yesterday except that has changed   Gen:- Awake Alert,  In no apparent distress  HEENT:- [place].AT, No sclera icterus Neck-Supple Neck,No JVD,.  Lungs-  CTAB , fairly symmetrical air movement CV- S1, S2 normal, regular Abd-  +ve B.Sounds, Abd Soft, not distended, no significant abdominal tenderness Extremity/Skin:- No  edema,   good pulses Psych-affect is appropriate, oriented  x3 Neuro-no new focal deficits, no tremors   Data Review:   Micro Results Recent Results (from the past 240 hour(s))  Blood Culture (routine x 2)     Status: None (Preliminary result)   Collection Time: 02/09/18 11:47 AM  Result Value Ref Range Status   Specimen Description BLOOD RIGHT ANTECUBITAL  Final   Special Requests   Final    BOTTLES DRAWN AEROBIC AND ANAEROBIC Blood Culture results may not be optimal due to an inadequate volume of blood received in culture bottles   Culture   Final    NO GROWTH 4 DAYS Performed at [facility], [place] [address] [place], [place] [address]    Report Status PENDING  Incomplete  Blood Culture (routine x 2)  Status: None (Preliminary result)   Collection Time: 02/09/18 11:57 AM  Result Value Ref Range Status   Specimen Description BLOOD SITE NOT SPECIFIED  Final   Special Requests   Final    BOTTLES DRAWN AEROBIC AND ANAEROBIC Blood Culture results may not be optimal due to an inadequate volume of blood received in culture bottles   Culture   Final    NO GROWTH 4 DAYS Performed at [facility], [place] [address] [place], [place] [address]    Report Status PENDING  Incomplete  Urine culture     Status: None   Collection Time: 02/09/18  3:26 PM  Result Value Ref Range Status   Specimen Description URINE, CLEAN CATCH  Final   Special Requests NONE  Final   Culture   Final    NO GROWTH Performed at [facility], [place] [address], [place], [place] [address]    Report Status 02/10/2018 FINAL  Final    Radiology Reports Dg Chest 2 View  Result Date: 02/09/2018 CLINICAL DATA:  Severe back pain with fever 3 days. EXAM: CHEST - 2 VIEW COMPARISON:  01/08/2018 and CT 01/09/2018 FINDINGS: Lungs are adequately inflated without focal airspace consolidation or effusion. Cardiomediastinal silhouette is within normal. There is degenerative change of the spine with a stable mild compression deformity over the mid to lower thoracic spine.  Moderate degenerative change of the shoulders. IMPRESSION: No acute cardiopulmonary disease. Stable mild compression fracture over the mid to lower thoracic spine. Electronically Signed   By: Marin Olp M.D.   On: 02/09/2018 12:59   Dg Abd Portable 2v  Result Date: 02/11/2018 CLINICAL DATA:  Severe constipation EXAM: PORTABLE ABDOMEN - 2 VIEW COMPARISON:  CT 02/09/2018, pelvis radiograph 11/01/2015 FINDINGS: Patchy atelectasis at the left base with possible tiny left effusion. Development of dilated small bowel up to 3.2 cm with increased gaseous enlargement of right lower quadrant small and large bowel. Some distal colon gas is present. There are bilateral hip replacements. No definite free air beneath the diaphragm. IMPRESSION: Interim development of dilated small and large bowel since previous CT with prominent gas-filled loops of bowel in the right lower quadrant. Findings could be secondary to an ileus, however partial bowel obstruction is also a concern. Electronically Signed   By: Donavan Foil M.D.   On: 02/11/2018 17:19   Ct Renal Stone Study  Result Date: 02/09/2018 CLINICAL DATA:  Left flank pain and hematuria beginning 2 days ago. Stone disease suspected. EXAM: CT ABDOMEN AND PELVIS WITHOUT CONTRAST TECHNIQUE: Multidetector CT imaging of the abdomen and pelvis was performed following the standard protocol without IV contrast. COMPARISON:  CT of the abdomen and pelvis 08/11/2015 FINDINGS: Lower chest: Mild dependent atelectasis is present in the lower lobes bilaterally. No significant effusion is present. Coronary artery calcifications are present. Heart size is normal. Blood pool is hypodense consistent with borderline anemia. Hepatobiliary: 2 subcentimeter lesions in the left lobe of the liver are stable. Likely represent small cysts. Patient is status post cholecystectomy. Common bile duct is within normal limits. Pancreas: Unremarkable. No pancreatic ductal dilatation or surrounding  inflammatory changes. Spleen: Normal in size without focal abnormality. Adrenals/Urinary Tract: The adrenal glands are within normal limits bilaterally. Kidneys are unremarkable. There is no stone or mass lesion. No hydronephrosis is present. The ureters are within normal limits bilaterally. Distal ureters are obscured by streak artifact from bilateral hip prostheses. Stomach/Bowel: A small hiatal hernia is present. The stomach and duodenum are otherwise  within normal limits. Small bowel is unremarkable. Terminal ileum is within normal limits. The appendix is visualized and normal. Moderate stool is present in the cecum and ascending colon. Transverse colon is within normal limits. Diverticular changes are present in the distal descending and sigmoid colon. Although the area is somewhat obscured by streak artifact, there appears to be some stranding about the sigmoid colon. No definite free air or abscess formation is present. Vascular/Lymphatic: Atherosclerotic calcifications are present in the aorta and branch vessels without aneurysm. No significant adenopathy is present. No significant adenopathy is present. Reproductive: Prostate is unremarkable. Other: No abdominal wall hernia or abnormality. No abdominopelvic ascites. Musculoskeletal: Bilateral total hip arthroplasty is present. Degenerative changes are noted in the lumbar spine with levoconvex curvature centered at L4-5. Facet degenerative changes are greatest at L4-5 and L5-S1. No focal lytic or blastic lesions are present. IMPRESSION: 1. Diffuse descending and sigmoid diverticulosis with probable inflammatory changes about the sigmoid colon suggesting diverticulitis. The area is somewhat obscured by streak artifact from bilateral hip prostheses. No complicating features are evident. 2. No nephrolithiasis or hydronephrosis. 3.  Aortic Atherosclerosis (ICD10-I70.0). 4. Coronary artery disease. 5. Mild dependent atelectasis at the lung bases bilaterally.  Electronically Signed   By: San Morelle M.D.   On: 02/09/2018 16:19     CBC Recent Labs  Lab 02/09/18 1038 02/10/18 0252 02/11/18 0253 02/12/18 0401 02/13/18 0229  WBC 13.7* 12.8* 13.7* 12.4* 8.2  HGB 13.6 12.0* 11.6* 12.5* 10.9*  HCT 43.0 36.8* 34.5* 37.3* 34.0*  PLT 375 352 379 444* 373  MCV 90.3 87.4 86.0 86.1 88.1  MCH 28.6 28.5 28.9 28.9 28.2  MCHC 31.6 32.6 33.6 33.5 32.1  RDW 12.3 12.1 12.1 12.3 12.5  LYMPHSABS  --   --  0.9 1.9 1.5  MONOABS  --   --  0.7 1.0 0.6  EOSABS  --   --  0.0 0.0 0.1  BASOSABS  --   --  0.0 0.0 0.0    Chemistries  Recent Labs  Lab 02/09/18 1038 02/09/18 1915 02/10/18 0252 02/11/18 0253 02/12/18 0401 02/13/18 0229  NA 136  --  133* 136 138 139  K 3.2*  --  3.6 2.7* 2.9* 3.4*  CL 100  --  102 108 108 115*  CO2 24  --  20* 21* 21* 17*  GLUCOSE 106*  --  89 132* 88 72  BUN 9  --  '9 11 11 16  '$ CREATININE 0.66  --  0.62 0.47* 0.54* 0.56*  CALCIUM 9.1  --  8.0* 7.8* 7.7* 7.3*  MG  --   --   --   --   --  2.0  AST  --  16  --   --   --   --   ALT  --  11  --   --   --   --   ALKPHOS  --  64  --   --   --   --   BILITOT  --  1.7*  --   --   --   --    ------------------------------------------------------------------------------------------------------------------ No results for input(s): CHOL, HDL, LDLCALC, TRIG, CHOLHDL, LDLDIRECT in the last 72 hours.  No results found for: HGBA1C ------------------------------------------------------------------------------------------------------------------ No results for input(s): TSH, T4TOTAL, T3FREE, THYROIDAB in the last 72 hours.  Invalid input(s): FREET3 ------------------------------------------------------------------------------------------------------------------ No results for input(s): VITAMINB12, FOLATE, FERRITIN, TIBC, IRON, RETICCTPCT in the last 72 hours.  Coagulation profile No results for input(s): INR, PROTIME in the  last 168 hours.  No results for input(s): DDIMER  in the last 72 hours.  Cardiac Enzymes No results for input(s): CKMB, TROPONINI, MYOGLOBIN in the last 168 hours.  Invalid input(s): CK ------------------------------------------------------------------------------------------------------------------ No results found for: BNP   Roxan Hockey M.D on 02/13/2018 at 4:22 PM  Pager---[phone] Go to [email or web address] - password [email or web address] for contact info  [facility] - Office  [phone]

## 2018-02-13 NOTE — Plan of Care (Signed)
Nutrition Education Note  RD consulted for nutrition education regarding nutrition management for diverticulosis/ Diverticulitis.   Spoke with John Lyons and sister at bedside; sister indicated that she requested the consult as she is concerned about John Lyons's diet after discharge regarding diverticulitis.  John Lyons resides with his daughter and grandson (his wife lives in a SNF). John Lyons reports that daughter does the grocery shopping, however, both daughter and grandson work atypical hours and do not cook. John Lyons consumes 2 meals and 2 snacks per day (Breakfast: shredded wheat, coffee, banana; Snack: banana and juice or cookie, Lunch: ham sandwich, chips, cookie; Snack: cookie and juice). John Lyons reports he often does not eat dinner as he spends his evening visiting his wife at SNF, feeding her dinner. John Lyons also reports it is hard for him to prepare meals secondary to his arthritis.   John Lyons reports mild wt loss. No wt significant for time frame. Nutrition-Focused physical exam completed. Findings are no fat depletion, mild muscle depletion, and no edema.  Muscle depletions found in upper extremities likely related to older age and arthritis.   John Lyons and sister grateful for RD visit. Sister indicates that she will assist him with grocery shopping after discharge.    RD provided "Fiber Restricted Nutrition Therapy" handout as well as "High Fiber Nutrition Therapy" handout from the Academy of [employer]. Reviewed patient's dietary recall and discussed ways for John Lyons to meet nutrition goals over the next several weeks. Explained reasons for John Lyons to follow a low fiber diet over the next 4-8 weeks. Reviewed low fiber foods and high fiber foods. Discussed best practice for long term management of diverticulosis is a high fiber diet and discussed ways to gradually increase fiber in the diet.   Teach back method used. John Lyons verbalizes understanding of information provided.   Expect fair to good compliance.  Body mass index is Body mass  index is 22.92 kg/m.Marland Kitchen John Lyons meets criteria for normal weight range based on current BMI.  Current diet order is clear liquid, patient is consuming approximately 75% of meals at this time. Labs and medications reviewed. No further nutrition interventions warranted at this time. RD contact information provided. If additional nutrition issues arise, please re-consult RD.  Chyler Creely A. Jimmye Norman, RD, LDN, CDE Pager: [phone] After hours Pager: [phone]

## 2018-02-13 NOTE — Progress Notes (Signed)
Pharmacy Antibiotic Note  John Lyons is a 72 y.o. male admitted on 02/09/2018 with flank pain and concern for pyelo vs intra-abdominal infection. Pharmacy has been consulted for Zosyn dosing - total abx day #5. SCr stable wnl.  Plan: - D/c cefepime/flagyl per MD > start Zosyn 3.375g IV q8h (4h infusion) - Will continue to follow renal function, culture results, LOT, and antibiotic de-escalation plans - Rx will s/o consult and follow peripherally  Height: 5\' 3"  (160 cm) Weight: 129 lb 6.6 oz (58.7 kg) IBW/kg (Calculated) : 56.9  Temp (24hrs), Avg:98.6 F (37 C), Min:97.7 F (36.5 C), Max:99.4 F (37.4 C)  Recent Labs  Lab 02/09/18 1038 02/09/18 1104 02/09/18 1353 02/10/18 0252 02/11/18 0253 02/12/18 0401 02/13/18 0229  WBC 13.7*  --   --  12.8* 13.7* 12.4* 8.2  CREATININE 0.66  --   --  0.62 0.47* 0.54* 0.56*  LATICACIDVEN  --  2.01* 1.05  --   --   --   --     Estimated Creatinine Clearance: 65.2 mL/min (A) (by C-G formula based on SCr of 0.56 mg/dL (L)).    Allergies  Allergen Reactions  . Adhesive [Tape] Rash  . Phenobarbital Rash    Antimicrobials this admission: CTX 10/19 x 1 Cefepime 10/19 >> Flagyl 10/19 >>  Dose adjustments this admission: n/a  Microbiology results: 10/19 BCx >> ngtd 10/19 UCx >> neg  Elicia Lamp, PharmD, BCPS Clinical Pharmacist Clinical phone [phone] Please check AMION for all [facility] contact numbers 02/13/2018 8:32 AM

## 2018-02-13 NOTE — Evaluation (Signed)
Physical Therapy Evaluation Patient Details Name: John Lyons MRN: [id number] DOB: 20-Oct-1945 Today's Date: 02/13/2018   History of Present Illness  John Lyons a 72 y.o.malewith medical history significant forAddison's disease, hypertension, hyperlipidemia, rheumatoid arthritis, diverticulosis, presents to the ER complaining of left lower quadrant/left flank sharp pain, ongoing for the past 2 days, 10/10inseverity at its worse,does notradiate, nothing makes it better or worse.Diagnosed with sepsis secondary to acute diverticulitis, possible partial bowel obstruction vs. Ileus, hypokalemia  Clinical Impression   Pt admitted with above diagnosis. Pt currently with functional limitations due to the deficits listed below (see PT Problem List). 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       Follow Up Recommendations Home health PT;Supervision/Assistance - 24 hour    Equipment Recommendations  None recommended by PT;Other (comment)(pretty well-equipped, but will continue to assess/update prn)    Recommendations for Other Services OT consult(ordered per protocol)     Precautions / Restrictions Precautions Precautions: Fall Restrictions Weight Bearing Restrictions: No      Mobility  Bed Mobility                  Transfers Overall transfer level: Needs assistance Equipment used: Rolling walker (2 wheeled) Transfers: Sit to/from Stand Sit to Stand: Max assist         General transfer comment: One person max assist to help with power up  and anterior translation of center of mass over feet; Once up, max assist to steady as pt "stepped" his feet backward, under his center of mass, forearms on RW; Pt tells me he tends to pull self up to stand with forearms on desk or table in front of him  Ambulation/Gait Ambulation/Gait assistance: Min assist Gait Distance (Feet): 120 Feet Assistive device: Rolling walker (2 wheeled) Gait Pattern/deviations: Decreased dorsiflexion - right;Decreased dorsiflexion - left     General Gait Details: L knee in near full extension throughout; noting some foot flat steps bilaterally; trunk slightly flexed; Limited by longstanding RA with numerous gait deviations  Stairs            Wheelchair Mobility    Modified Rankin (Stroke Patients Only)       Balance                                             Pertinent Vitals/Pain Pain Assessment: Faces Faces Pain Scale: Hurts a little bit Pain Location: back pain Pain Descriptors / Indicators: [place] expects to be discharged to:: Private residence Living Arrangements: Children Available Help at Discharge: Family Type of Home: House Home Access: Ramped entrance     Lyons: [employer]: Environmental consultant - 2 wheels;Shower seat      Prior Function Level of Independence: Needs assistance   Gait / Transfers Assistance Needed: consistently uses RW  ADL's / Homemaking Assistance Needed: Walk-in shower with stool; reports he has everything setup within reach  Comments: 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[employer], 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      Reubens Hand: Right    Extremity/Trunk Assessment   Upper Extremity Assessment Upper Extremity Assessment: Defer to OT evaluation(Noted significant impariments due to RA)    Lower Extremity Assessment Lower Extremity Assessment: Generalized weakness;LLE deficits/detail;RLE deficits/detail RLE  Deficits / Details: Noted foot tending to supinate, edema R knee, limited ROM into flexion and extension dur to RA LLE Deficits / Details: Significantly decr knee flexion with knee grossly fused in near full extension; limited ankle ROM as well       Communication   Communication: No difficulties  Cognition Arousal/Alertness: Awake/alert Behavior During Therapy: WFL for tasks assessed/performed Overall Cognitive Status: Within Functional Limits for tasks assessed                                        General Comments      Exercises     Assessment/Plan    PT Assessment Patient needs continued PT services  PT Problem List Decreased strength;Decreased range of motion;Decreased activity tolerance;Decreased balance;Decreased mobility;Decreased coordination;Decreased knowledge of use of DME;Decreased safety awareness;Decreased knowledge of precautions;Pain       PT Treatment Interventions DME instruction;Gait training;Functional mobility training;Therapeutic activities;Therapeutic exercise;Balance training;Neuromuscular re-education;Patient/family education;Wheelchair mobility training    PT Goals (Current goals can be found in the Care Plan section)  Acute Rehab PT Goals Patient Stated Goal: back to work PT Goal Formulation: With patient Time For Goal Achievement: 02/27/18 Potential to Achieve Goals: Good    Frequency Min 3X/week   Barriers to discharge   Would like to verify/clarify how much assist daughter can provide at home    Co-evaluation               AM-PAC PT "6 Clicks" Daily Activity  Outcome Measure Difficulty turning over in bed (including adjusting bedclothes, sheets and blankets)?: A Lot Difficulty moving from lying on back to sitting on the side of the bed? : A Lot Difficulty sitting down on and standing up from a chair with arms (e.g., wheelchair, bedside commode, etc,.)?: Unable Help needed moving to and from a bed to chair (including  a wheelchair)?: A Little Help needed walking in hospital room?: A Little Help needed climbing 3-5 steps with a railing? : A Lot 6 Click Score: 13    End of Session Equipment Utilized During Treatment: Gait belt Activity Tolerance: Patient tolerated treatment well Patient left: in chair;with call bell/phone within reach;with family/visitor present Nurse Communication: Mobility status PT Visit Diagnosis: Unsteadiness on feet (R26.81);Other abnormalities of gait and mobility (R26.89);Muscle weakness (generalized) (M62.81);History of falling (Z91.81)    Time: 1211-1251(minus approx 5-8 minutes while pt was in bathroom) PT Time Calculation (min) (ACUTE ONLY): 40 min   Charges:   PT Evaluation $PT Eval Moderate Complexity: 1 Mod PT Treatments $Gait Training: 8-22 mins        Roney Marion, PT  Acute Rehabilitation Services Pager [phone] Office [place] 02/13/2018, 4:40 PM

## 2018-02-13 NOTE — Progress Notes (Signed)
[facility] Progress Note     Subjective: CC-  Sister at bedside. Patient states that he is feeling much better today. Denies abdominal pain, nausea, or vomiting. Reports 10+ loose BMs yesterday, last one was around 2100 yesterday. Passing flatus this AM. States that he's hungry. WBC 8.2.  Objective: Vital signs in last 24 hours: Temp:  [97.7 F (36.5 C)-99.4 F (37.4 C)] 97.7 F (36.5 C) (10/23 0350) Pulse Rate:  [80-93] 80 (10/23 0350) Resp:  [17-18] 18 (10/23 0350) BP: (129-164)/(61-72) 129/61 (10/23 0350) SpO2:  [96 %-98 %] 96 % (10/23 0350) Last BM Date: 02/12/18  Intake/Output from previous day: 10/22 0701 - 10/23 0700 In: 2522 [I.V.:1627.6; IV Piggyback:894.3] Out: -  Intake/Output this shift: No intake/output data recorded.  PE: Gen:  Alert, NAD, pleasant HEENT: EOM's intact, pupils equal and round Card:  RRR Pulm:  CTAB, no W/R/R, effort normal Abd: Soft, NT/ND, +BS, no HSMdrainage Ext:  Calves soft and nontender Psych: A&Ox3  Skin: no rashes noted, warm and dry  Lab Results:  Recent Labs    02/12/18 0401 02/13/18 0229  WBC 12.4* 8.2  HGB 12.5* 10.9*  HCT 37.3* 34.0*  PLT 444* 373   BMET Recent Labs    02/12/18 0401 02/13/18 0229  NA 138 139  K 2.9* 3.4*  CL 108 115*  CO2 21* 17*  GLUCOSE 88 72  BUN 11 16  CREATININE 0.54* 0.56*  CALCIUM 7.7* 7.3*   PT/INR No results for input(s): LABPROT, INR in the last 72 hours. CMP     Component Value Date/Time   NA 139 02/13/2018 0229   K 3.4 (L) 02/13/2018 0229   CL 115 (H) 02/13/2018 0229   CO2 17 (L) 02/13/2018 0229   GLUCOSE 72 02/13/2018 0229   BUN 16 02/13/2018 0229   CREATININE 0.56 (L) 02/13/2018 0229   CALCIUM 7.3 (L) 02/13/2018 0229   PROT 5.8 (L) 02/09/2018 1915   ALBUMIN 3.0 (L) 02/09/2018 1915   AST 16 02/09/2018 1915   ALT 11 02/09/2018 1915   ALKPHOS 64 02/09/2018 1915   BILITOT 1.7 (H) 02/09/2018 1915   GFRNONAA >60 02/13/2018 0229   GFRAA >60 02/13/2018  0229   Lipase     Component Value Date/Time   LIPASE 23 02/09/2018 1915       Studies/Results: Dg Abd Portable 2v  Result Date: 02/11/2018 CLINICAL DATA:  Severe constipation EXAM: PORTABLE ABDOMEN - 2 VIEW COMPARISON:  CT 02/09/2018, pelvis radiograph 11/01/2015 FINDINGS: Patchy atelectasis at the left base with possible tiny left effusion. Development of dilated small bowel up to 3.2 cm with increased gaseous enlargement of right lower quadrant small and large bowel. Some distal colon gas is present. There are bilateral hip replacements. No definite free air beneath the diaphragm. IMPRESSION: Interim development of dilated small and large bowel since previous CT with prominent gas-filled loops of bowel in the right lower quadrant. Findings could be secondary to an ileus, however partial bowel obstruction is also a concern. Electronically Signed   By: Donavan Foil M.D.   On: 02/11/2018 17:19    Anti-infectives: Anti-infectives (From admission, onward)   Start     Dose/Rate Route Frequency Ordered Stop   02/10/18 0000  metroNIDAZOLE (FLAGYL) IVPB 500 mg  Status:  Discontinued     500 mg 100 mL/hr over 60 Minutes Intravenous Every 8 hours 02/09/18 1737 02/13/18 0813   02/09/18 2200  ceFEPIme (MAXIPIME) 2 g in sodium chloride 0.9 % 100 mL IVPB  Status:  Discontinued     2 g 200 mL/hr over 30 Minutes Intravenous Every 12 hours 02/09/18 1811 02/13/18 0813   02/09/18 1745  hydroxychloroquine (PLAQUENIL) tablet 200 mg     200 mg Oral Daily 02/09/18 1744     02/09/18 1645  metroNIDAZOLE (FLAGYL) IVPB 500 mg     500 mg 100 mL/hr over 60 Minutes Intravenous  Once 02/09/18 1641 02/09/18 1827   02/09/18 1115  cefTRIAXone (ROCEPHIN) 1 g in sodium chloride 0.9 % 100 mL IVPB  Status:  Discontinued     1 g 200 mL/hr over 30 Minutes Intravenous Every 24 hours 02/09/18 1112 02/09/18 1811       Assessment/Plan Addison's disease HTN GERD RA HLD  Diverticulitis  - CT 10/19 Diffuse  descending and sigmoid diverticulosis with probable inflammatory changes about the sigmoid colon suggesting diverticulitis - improving  Ileus - resolving  FEN: IVF, CLD VTE: SCD's, lovenox ID: Flagyl 10/20>>  Maxipime 10/19>> Foley: none Follow up: TBD  Plan: Ileus and diverticulitis appear to be improving; patient has no abdominal pain or n/v, WBC WNL, and he is having bowel function. Continue abx for diverticulitis. Add probiotic. Advance to clear liquids; if tolerating this ok to advance to fulls later today. Consult PT.   LOS: 4 days    Wellington Hampshire , [facility] 02/13/2018, 8:14 AM Pager: [phone] Mon 7:00 am -11:30 AM Tues-Fri 7:00 am-4:30 pm Sat-Sun 7:00 am-11:30 am

## 2018-02-13 NOTE — Consult Note (Signed)
[facility] CM Primary Care Navigator  02/13/2018  John Lyons 18-Aug-1945 [id number]   Met with patientand sisters (Susie and Gene) at the bedside to identify possible discharge needs. Patient reportshaving "sharp left flank pain and fever" that had led to this admission. (sepsis secondary to acute diverticulitis; possible partial bowel obstruction vs. ileus)  Patient endorses Dr.Mark Perini with [facility] as hisprimary care provider.   Patient shared using [facility] pharmacy in [place] to obtain medications without difficulty so far.Patient is aware to notify primary care provider if he has medication issues that may arise in the future.  Patientverbalized managing hisownmedications at homeusing "pill box" system filled weekly.  Patientreports that he has beendrivingprior to admission. His sisters, daughter Roselyn Reef) or brother Laverna Peace) will be able to provide transportation to his doctors' appointments if needed after discharge.  Patient's daughter and grandson lives with him and have been providing assistance with his needs at home.Patient's sisters and brother are also supportive of his care.  Disposition for discharge isstillto be determined pending clinical improvement per patient. Await therapy evaluation and recommendation as well.  Patient voiced understanding to call primary care provider's office whenhe returns home, for a post discharge follow-up appointment within 1- 2 weeks or sooner if needs arise.Patient letter (with PCP's contact number) was provided as a reminder.  Explained to patient about [facility] CM services available for health management and resources at home but he indicated having no current needs or concerns at this point.  Patient had voiced understandingto seekreferral to [facility] care managementfrom primary care providerif deemed necessary andappropriate foranyservices in thefuture.  [facility] care  management information provided for future needs that he may have.   Primary care provider's office is listed as providing transition of care (TOC) follow-up.    For additional questions please contact:  Edwena Felty A. Gina Costilla, BSN, RN-BC [facility] PRIMARY CARE Navigator Cell: [phone]

## 2018-02-14 LAB — CBC WITH DIFFERENTIAL/PLATELET
Abs Immature Granulocytes: 0.03 10*3/uL (ref 0.00–0.07)
Basophils Absolute: 0 10*3/uL (ref 0.0–0.1)
Basophils Relative: 0 %
EOS ABS: 0.2 10*3/uL (ref 0.0–0.5)
Eosinophils Relative: 2 %
HCT: 33.4 % — ABNORMAL LOW (ref 39.0–52.0)
Hemoglobin: 10.8 g/dL — ABNORMAL LOW (ref 13.0–17.0)
IMMATURE GRANULOCYTES: 0 %
Lymphocytes Relative: 18 %
Lymphs Abs: 1.7 10*3/uL (ref 0.7–4.0)
MCH: 28.1 pg (ref 26.0–34.0)
MCHC: 32.3 g/dL (ref 30.0–36.0)
MCV: 86.8 fL (ref 80.0–100.0)
MONO ABS: 0.8 10*3/uL (ref 0.1–1.0)
MONOS PCT: 9 %
NEUTROS ABS: 6.5 10*3/uL (ref 1.7–7.7)
NEUTROS PCT: 71 %
PLATELETS: 400 10*3/uL (ref 150–400)
RBC: 3.85 MIL/uL — AB (ref 4.22–5.81)
RDW: 12.3 % (ref 11.5–15.5)
WBC: 9.2 10*3/uL (ref 4.0–10.5)
nRBC: 0 % (ref 0.0–0.2)

## 2018-02-14 LAB — BASIC METABOLIC PANEL
Anion gap: 7 (ref 5–15)
BUN: 7 mg/dL — AB (ref 8–23)
CO2: 19 mmol/L — ABNORMAL LOW (ref 22–32)
Calcium: 7.1 mg/dL — ABNORMAL LOW (ref 8.9–10.3)
Chloride: 111 mmol/L (ref 98–111)
Creatinine, Ser: 0.46 mg/dL — ABNORMAL LOW (ref 0.61–1.24)
GFR calc Af Amer: >60 mL/min (ref >60)
GLUCOSE: 85 mg/dL (ref 70–99)
Potassium: 2.7 mmol/L — CL (ref 3.5–5.1)
SODIUM: 137 mmol/L (ref 135–145)

## 2018-02-14 LAB — CULTURE, BLOOD (ROUTINE X 2)
CULTURE: NO GROWTH
Culture: NO GROWTH

## 2018-02-14 LAB — URINALYSIS, ROUTINE W REFLEX MICROSCOPIC
Bacteria, UA: NONE SEEN
Bilirubin Urine: NEGATIVE
Glucose, UA: NEGATIVE mg/dL
Ketones, ur: NEGATIVE mg/dL
Leukocytes, UA: NEGATIVE
Nitrite: NEGATIVE
Protein, ur: NEGATIVE mg/dL
Specific Gravity, Urine: 1.003 — ABNORMAL LOW (ref 1.005–1.030)
pH: 6 (ref 5.0–8.0)

## 2018-02-14 LAB — MAGNESIUM: MAGNESIUM: 1.7 mg/dL (ref 1.7–2.4)

## 2018-02-14 MED ORDER — AMLODIPINE BESYLATE 5 MG PO TABS
5.0000 mg | ORAL_TABLET | Freq: Every day | ORAL | Status: DC
  Administered 2018-02-14 – 2018-02-15 (×2): 5 mg via ORAL
  Filled 2018-02-14 (×2): qty 1

## 2018-02-14 MED ORDER — MAGNESIUM SULFATE 2 GM/50ML IV SOLN
2.0000 g | Freq: Once | INTRAVENOUS | Status: AC
  Administered 2018-02-14: 2 g via INTRAVENOUS
  Filled 2018-02-14: qty 50

## 2018-02-14 MED ORDER — POTASSIUM CHLORIDE CRYS ER 20 MEQ PO TBCR
40.0000 meq | EXTENDED_RELEASE_TABLET | ORAL | Status: AC
  Administered 2018-02-14 (×2): 40 meq via ORAL
  Filled 2018-02-14 (×2): qty 2

## 2018-02-14 MED ORDER — HYDRALAZINE HCL 20 MG/ML IJ SOLN: 10.0000 mg | Freq: Four times a day (QID) | INTRAMUSCULAR | Status: DC | PRN

## 2018-02-14 MED ORDER — POTASSIUM CHLORIDE CRYS ER 20 MEQ PO TBCR
40.0000 meq | EXTENDED_RELEASE_TABLET | Freq: Once | ORAL | Status: AC
  Administered 2018-02-14: 40 meq via ORAL

## 2018-02-14 MED ORDER — POTASSIUM CHLORIDE CRYS ER 20 MEQ PO TBCR
40.0000 meq | EXTENDED_RELEASE_TABLET | Freq: Once | ORAL | Status: AC
  Administered 2018-02-14: 40 meq via ORAL
  Filled 2018-02-14: qty 2

## 2018-02-14 NOTE — Progress Notes (Addendum)
CRITICAL VALUE ALERT  Critical Value:  K=2.7  Date & Time Notied:  02/14/18; 3241  Provider Notified: Raliegh Ip Schorr  Orders Received/Actions taken: Awaiting orders/call back.   Received order: Klor-con 40 mEq tablet Q4 x 2 to start at 0500

## 2018-02-14 NOTE — Progress Notes (Signed)
Patient Demographics:    John Lyons, is a 72 y.o. male, DOB - 12/06/1945, VOH:[id number]  Admit date - 02/09/2018   Admitting Physician [name], MD  Outpatient Primary MD for the patient is [name], MD  LOS - 5   Chief Complaint  Patient presents with  . Flank Pain        Subjective:    John Lyons today complains of generalized weakness and fatigue, his brother is at bedside, occupational therapist consult at bedside, tolerating clear liquid diet well, patient has rheumatoid arthritis complains of arthralgia and stiffness  Assessment  & Plan :    Principal Problem:   Acute diverticulitis Active Problems:   Essential hypertension   Addison's disease ([place])   Rheumatoid arthritis ([place])   GERD (gastroesophageal reflux disease)  Brief summary 72 y.o. male with medical history significant for Addison's disease, hypertension, hyperlipidemia, rheumatoid arthritis, diverticulosis admitted on 02/09/2018 with abdominal pain and fevers and clinical and imaging findings consistent with diverticulitis. Patient met sepsis criteria with fevers, leukocytosis and lactic acidosis.  Urine and Blood culture negative   Plan:- 1)Sepsis 2/2 acute Diverticulitis-  clinically slowly improving, afebrile with no further leukocytosis, lactic acid previously normalized,, blood cultures negative, continue IV Zosyn started on 02/13/2018 plan to to discharge home on p.o. Augmentin, continue probiotic, use PRN pain medications and antiemetics, advance diet to full liquid  2)Possible partial bowel obstruction Vs ileus-  resolved emesis, diarrhea has resolved,, advance diet to full liquids surgical input appreciated, may advance diet as tolerated per surgical team, continue to hold Linzess  3)FEN/Hypokalemia----potassium is down to 2.7, replace and recheck, check magnesium advance diet as above #2,  persistent/recurrent hypokalemia may be related to Florinef use  4)Addison's disease- , patient received stress dose of Solu-Cortef, continue Florinef 0.1 mg daily, and hydrocortisone 20 mg daily ,,   5)Hypertension- stable, restart amlodipine at 5 mg daily, continue to hold Telmisartan for now,  may use IV Hydralazine 10 mg  Every 4 hours Prn for systolic blood pressure over 160 mmhg  6)Dysrhythmia-stable, asymptomatic, continue digoxin  7)Rheumatoid arthritis-  Continue Plaquenil andfolic acid, complains of arthralgia, already on hydrocortisone  8)Mobility related challenges-----patient has generalized weakness, fatigue, has doing difficulties with mobility related activities of daily living, patient has rheumatoid arthritis complains of arthralgia and stiffness, PT/OT recommends SNF rehab   Code Status: DNR  Family Communication:  brother at bedside  Disposition Plan:  SNF   Consultants: General surgery/dietitian  Procedures:  None   Antimicrobials:  Cefepime- stopped 02/13/18  Flagyl -stopped 02/13/18  Zosyn started 02/13/18  Disposition/Need for in-Hospital Stay- patient unable to be discharged at this time due to Need to SNF Rehab pending insurance approval , also needs to advance diet and replace electrolytes   DVT Prophylaxis  :  Lovenox  Lab Results  Component Value Date   PLT 400 02/14/2018    Inpatient Medications  Scheduled Meds: . ALPRAZolam  1 mg Oral QHS  . amLODipine  5 mg Oral Daily  . dextromethorphan-guaiFENesin  1 tablet Oral BID  . digoxin  0.0625 mg Oral Daily  . enoxaparin (LOVENOX) injection  40 mg Subcutaneous Q24H  . fludrocortisone  0.1 mg Oral Daily  . fluticasone  1 spray Each  Nare Daily  . folic acid  213 mcg Oral Daily  . hydrocortisone  20 mg Oral Daily  . hydroxychloroquine  200 mg Oral Daily  . pantoprazole  40 mg Oral Daily  . polyethylene glycol  17 g Oral BID  . potassium chloride  40 mEq Oral Q4H  . potassium  chloride  40 mEq Oral Once  . potassium chloride  40 mEq Oral Once  . saccharomyces boulardii  250 mg Oral BID  . senna-docusate  1 tablet Oral BID  . sodium chloride flush  3 mL Intravenous Q12H   Continuous Infusions: . sodium chloride 50 mL/hr at 02/14/18 0637  . sodium chloride     PRN Meds:.sodium chloride, acetaminophen **OR** acetaminophen, albuterol, hydrALAZINE, morphine injection, ondansetron **OR** ondansetron (ZOFRAN) IV, oxyCODONE, polyvinyl alcohol, sodium chloride flush    Anti-infectives (From admission, onward)   Start     Dose/Rate Route Frequency Ordered Stop   02/10/18 0000  metroNIDAZOLE (FLAGYL) IVPB 500 mg  Status:  Discontinued     500 mg 100 mL/hr over 60 Minutes Intravenous Every 8 hours 02/09/18 1737 02/13/18 0813   02/09/18 2200  ceFEPIme (MAXIPIME) 2 g in sodium chloride 0.9 % 100 mL IVPB  Status:  Discontinued     2 g 200 mL/hr over 30 Minutes Intravenous Every 12 hours 02/09/18 1811 02/13/18 0813   02/09/18 1745  hydroxychloroquine (PLAQUENIL) tablet 200 mg     200 mg Oral Daily 02/09/18 1744     02/09/18 1645  metroNIDAZOLE (FLAGYL) IVPB 500 mg     500 mg 100 mL/hr over 60 Minutes Intravenous  Once 02/09/18 1641 02/09/18 1827   02/09/18 1115  cefTRIAXone (ROCEPHIN) 1 g in sodium chloride 0.9 % 100 mL IVPB  Status:  Discontinued     1 g 200 mL/hr over 30 Minutes Intravenous Every 24 hours 02/09/18 1112 02/09/18 1811        Objective:   Vitals:   02/13/18 1421 02/13/18 2144 02/14/18 0530 02/14/18 0800  BP: (!) 162/59 (!) 151/61 (!) 165/80 (!) 173/84  Pulse: 82 79 83 82  Resp: _0 Temp: 99.3 F (37.4 C) 99.5 F (37.5 C) 99 F (37.2 C) 98.5 F (36.9 C)  TempSrc: Oral Oral Oral Oral  SpO2: 100% 98% 98% 100%  Weight:      Height:        Wt Readings from Last 3 Encounters:  02/09/18 58.7 kg  01/01/18 59.2 kg  11/15/15 59 kg     Intake/Output Summary (Last 24 hours) at 02/14/2018 1151 Last data filed at 02/14/2018 0865 Gross  per 24 hour  Intake 1989.7 ml  Output -  Net 1989.7 ml     Physical Exam Patient is examined daily including today on 02/14/18 , exams remain the same as of yesterday except that has changed   Gen:- Awake Alert,  In no apparent distress  HEENT:- [place].AT, No sclera icterus Neck-Supple Neck,No JVD,.  Lungs-  CTAB , fairly symmetrical air movement CV- S1, S2 normal, regular Abd-  +ve B.Sounds, Abd Soft, not distended, no significant abdominal tenderness Extremity/Skin:- No  edema,   good pulses, polyarthralgia without obvious/significant inflammatory changes over the joints Psych-affect is appropriate, oriented x3 Neuro-no new focal deficits, no tremors   Data Review:   Micro Results Recent Results (from the past 240 hour(s))  Blood Culture (routine x 2)     Status: None   Collection Time: 02/09/18 11:47 AM  Result Value Ref Range Status  Specimen Description BLOOD RIGHT ANTECUBITAL  Final   Special Requests   Final    BOTTLES DRAWN AEROBIC AND ANAEROBIC Blood Culture results may not be optimal due to an inadequate volume of blood received in culture bottles   Culture   Final    NO GROWTH 5 DAYS Performed at [facility], [place] [address], [place], [place] [address]    Report Status 02/14/2018 FINAL  Final  Blood Culture (routine x 2)     Status: None   Collection Time: 02/09/18 11:57 AM  Result Value Ref Range Status   Specimen Description BLOOD SITE NOT SPECIFIED  Final   Special Requests   Final    BOTTLES DRAWN AEROBIC AND ANAEROBIC Blood Culture results may not be optimal due to an inadequate volume of blood received in culture bottles   Culture   Final    NO GROWTH 5 DAYS Performed at [facility], [place] [address], [place], [place] [address]    Report Status 02/14/2018 FINAL  Final  Urine culture     Status: None   Collection Time: 02/09/18  3:26 PM  Result Value Ref Range Status   Specimen Description URINE, CLEAN CATCH  Final   Special Requests NONE   Final   Culture   Final    NO GROWTH Performed at [facility], [place] [address], [place],  [address]    Report Status 02/10/2018 FINAL  Final    Radiology Reports Dg Chest 2 View  Result Date: 02/09/2018 CLINICAL DATA:  Severe back pain with fever 3 days. EXAM: CHEST - 2 VIEW COMPARISON:  01/08/2018 and CT 01/09/2018 FINDINGS: Lungs are adequately inflated without focal airspace consolidation or effusion. Cardiomediastinal silhouette is within normal. There is degenerative change of the spine with a stable mild compression deformity over the mid to lower thoracic spine. Moderate degenerative change of the shoulders. IMPRESSION: No acute cardiopulmonary disease. Stable mild compression fracture over the mid to lower thoracic spine. Electronically Signed   By: Marin Olp M.D.   On: 02/09/2018 12:59   Dg Abd Portable 2v  Result Date: 02/11/2018 CLINICAL DATA:  Severe constipation EXAM: PORTABLE ABDOMEN - 2 VIEW COMPARISON:  CT 02/09/2018, pelvis radiograph 11/01/2015 FINDINGS: Patchy atelectasis at the left base with possible tiny left effusion. Development of dilated small bowel up to 3.2 cm with increased gaseous enlargement of right lower quadrant small and large bowel. Some distal colon gas is present. There are bilateral hip replacements. No definite free air beneath the diaphragm. IMPRESSION: Interim development of dilated small and large bowel since previous CT with prominent gas-filled loops of bowel in the right lower quadrant. Findings could be secondary to an ileus, however partial bowel obstruction is also a concern. Electronically Signed   By: Donavan Foil M.D.   On: 02/11/2018 17:19   Ct Renal Stone Study  Result Date: 02/09/2018 CLINICAL DATA:  Left flank pain and hematuria beginning 2 days ago. Stone disease suspected. EXAM: CT ABDOMEN AND PELVIS WITHOUT CONTRAST TECHNIQUE: Multidetector CT imaging of the abdomen and pelvis was performed following the standard  protocol without IV contrast. COMPARISON:  CT of the abdomen and pelvis 08/11/2015 FINDINGS: Lower chest: Mild dependent atelectasis is present in the lower lobes bilaterally. No significant effusion is present. Coronary artery calcifications are present. Heart size is normal. Blood pool is hypodense consistent with borderline anemia. Hepatobiliary: 2 subcentimeter lesions in the left lobe of the liver are stable. Likely represent small cysts.  Patient is status post cholecystectomy. Common bile duct is within normal limits. Pancreas: Unremarkable. No pancreatic ductal dilatation or surrounding inflammatory changes. Spleen: Normal in size without focal abnormality. Adrenals/Urinary Tract: The adrenal glands are within normal limits bilaterally. Kidneys are unremarkable. There is no stone or mass lesion. No hydronephrosis is present. The ureters are within normal limits bilaterally. Distal ureters are obscured by streak artifact from bilateral hip prostheses. Stomach/Bowel: A small hiatal hernia is present. The stomach and duodenum are otherwise within normal limits. Small bowel is unremarkable. Terminal ileum is within normal limits. The appendix is visualized and normal. Moderate stool is present in the cecum and ascending colon. Transverse colon is within normal limits. Diverticular changes are present in the distal descending and sigmoid colon. Although the area is somewhat obscured by streak artifact, there appears to be some stranding about the sigmoid colon. No definite free air or abscess formation is present. Vascular/Lymphatic: Atherosclerotic calcifications are present in the aorta and branch vessels without aneurysm. No significant adenopathy is present. No significant adenopathy is present. Reproductive: Prostate is unremarkable. Other: No abdominal wall hernia or abnormality. No abdominopelvic ascites. Musculoskeletal: Bilateral total hip arthroplasty is present. Degenerative changes are noted in the  lumbar spine with levoconvex curvature centered at L4-5. Facet degenerative changes are greatest at L4-5 and L5-S1. No focal lytic or blastic lesions are present. IMPRESSION: 1. Diffuse descending and sigmoid diverticulosis with probable inflammatory changes about the sigmoid colon suggesting diverticulitis. The area is somewhat obscured by streak artifact from bilateral hip prostheses. No complicating features are evident. 2. No nephrolithiasis or hydronephrosis. 3.  Aortic Atherosclerosis (ICD10-I70.0). 4. Coronary artery disease. 5. Mild dependent atelectasis at the lung bases bilaterally. Electronically Signed   By: San Morelle M.D.   On: 02/09/2018 16:19     CBC Recent Labs  Lab 02/10/18 0252 02/11/18 0253 02/12/18 0401 02/13/18 0229 02/14/18 0317  WBC 12.8* 13.7* 12.4* 8.2 9.2  HGB 12.0* 11.6* 12.5* 10.9* 10.8*  HCT 36.8* 34.5* 37.3* 34.0* 33.4*  PLT 352 379 444* 373 400  MCV 87.4 86.0 86.1 88.1 86.8  MCH 28.5 28.9 28.9 28.2 28.1  MCHC 32.6 33.6 33.5 32.1 32.3  RDW 12.1 12.1 12.3 12.5 12.3  LYMPHSABS  --  0.9 1.9 1.5 1.7  MONOABS  --  0.7 1.0 0.6 0.8  EOSABS  --  0.0 0.0 0.1 0.2  BASOSABS  --  0.0 0.0 0.0 0.0    Chemistries  Recent Labs  Lab 02/09/18 1915 02/10/18 0252 02/11/18 0253 02/12/18 0401 02/13/18 0229 02/14/18 0317  NA  --  133* 136 138 139 137  K  --  3.6 2.7* 2.9* 3.4* 2.7*  CL  --  102 108 108 115* 111  CO2  --  20* 21* 21* 17* 19*  GLUCOSE  --  89 132* 88 72 85  BUN  --  _0 7*  CREATININE  --  0.62 0.47* 0.54* 0.56* 0.46*  CALCIUM  --  8.0* 7.8* 7.7* 7.3* 7.1*  MG  --   --   --   --  2.0  --   AST 16  --   --   --   --   --   ALT 11  --   --   --   --   --   ALKPHOS 64  --   --   --   --   --   BILITOT 1.7*  --   --   --   --   --    ------------------------------------------------------------------------------------------------------------------  No results for input(s): CHOL, HDL, LDLCALC, TRIG, CHOLHDL, LDLDIRECT in the last 72  hours.  No results found for: HGBA1C ------------------------------------------------------------------------------------------------------------------ No results for input(s): TSH, T4TOTAL, T3FREE, THYROIDAB in the last 72 hours.  Invalid input(s): FREET3 ------------------------------------------------------------------------------------------------------------------ No results for input(s): VITAMINB12, FOLATE, FERRITIN, TIBC, IRON, RETICCTPCT in the last 72 hours.  Coagulation profile No results for input(s): INR, PROTIME in the last 168 hours.  No results for input(s): DDIMER in the last 72 hours.  Cardiac Enzymes No results for input(s): CKMB, TROPONINI, MYOGLOBIN in the last 168 hours.  Invalid input(s): CK ------------------------------------------------------------------------------------------------------------------ No results found for: BNP   Roxan Hockey M.D on 02/14/2018 at 11:51 AM  Pager---[phone] Go to [email or web address] - password [email or web address] for contact info  [facility] - Office  [phone]

## 2018-02-14 NOTE — NC FL2 (Signed)
[place] LEVEL OF CARE SCREENING TOOL     IDENTIFICATION  Patient Name: John Lyons Birthdate: October 08, 1945 Sex: male Admission Date (Current Location): 02/09/2018  [facility] and [place] Number:  Herbalist and Address:  [facility] [place], [place] [address] [place], [place] [address]      Provider Number: [id number]  Attending Physician Name and Address:  Roxan Hockey, MD  Relative Name and Phone Number:       Current Level of Care: Hospital Recommended Level of Care: [place] Prior Approval Number:    Date Approved/Denied:   PASRR Number: [id number]  Discharge Plan: SNF    Current Diagnoses: Patient Active Problem List   Diagnosis Date Noted  . Acute diverticulitis 02/09/2018  . Dysrhythmia   . 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 Anxiety   . GERD (gastroesophageal reflux disease)   . Hyperlipidemia   . Hypertension   . Acute calculous cholecystitis   . Hypokalemia   . Abdominal pain 08/11/2015  . Addison's disease ([place]) 08/11/2015  . Spinal stenosis 08/11/2015  . Rheumatoid arthritis ([place]) 08/11/2015  . Diverticulosis 08/11/2015  . Fever 08/11/2015  . Pyrexia   . Midline low back pain without sciatica   . Essential hypertension 07/13/2015    Orientation RESPIRATION BLADDER Height & Weight     Self, Time, Situation, Place  Normal Continent Weight: 129 lb 6.6 oz (58.7 kg) Height:  5\' 3"  (160 cm)  BEHAVIORAL SYMPTOMS/MOOD NEUROLOGICAL BOWEL NUTRITION STATUS      Continent Diet(see discharge summary)  AMBULATORY STATUS COMMUNICATION OF NEEDS Skin   Limited Assist Verbally Normal                       Personal Care Assistance Level of Assistance  Bathing, Feeding, Dressing Bathing Assistance: Maximum assistance Feeding assistance: Independent Dressing Assistance: Limited assistance     Functional Limitations Info  Sight, Hearing, Speech Sight Info: Adequate Hearing  Info: Adequate Speech Info: Adequate    SPECIAL CARE FACTORS FREQUENCY  PT (By licensed PT), OT (By licensed OT)     PT Frequency: 5x week OT Frequency: 5x week            Contractures Contractures Info: Not present    Additional Factors Info  Code Status, Allergies, Psychotropic Code Status Info: DNR Allergies Info: ADHESIVE TAPE, PHENOBARBITAL  Psychotropic Info: ALPRAZolam (XANAX) tablet 1 mg daily at bedtime         Current Medications (02/14/2018):  This is the current hospital active medication list Current Facility-Administered Medications  Medication Dose Route Frequency Provider Last Rate Last Dose  . 0.9 %  sodium chloride infusion   Intravenous Continuous Roxan Hockey, MD 50 mL/hr at 02/14/18 8338    . 0.9 %  sodium chloride infusion  250 mL Intravenous PRN [name], MD      . acetaminophen (TYLENOL) tablet 650 mg  650 mg Oral Q6H PRN [name], MD   650 mg at 02/12/18 1530   Or  . acetaminophen (TYLENOL) suppository 650 mg  650 mg Rectal Q6H PRN [name], MD      . albuterol (PROVENTIL) (2.5 MG/3ML) 0.083% nebulizer solution 2.5 mg  2.5 mg Nebulization Q2H PRN [name], MD      . ALPRAZolam Duanne Moron) tablet 1 mg  1 mg Oral QHS [name], MD   1 mg at 02/13/18 2141  . dextromethorphan-guaiFENesin ([place] DM) 30-600  MG per 12 hr tablet 1 tablet  1 tablet Oral BID [name], MD   1 tablet at 02/12/18 1106  . digoxin (LANOXIN) tablet 0.0625 mg  0.0625 mg Oral Daily [name], MD   0.0625 mg at 02/13/18 1119  . enoxaparin (LOVENOX) injection 40 mg  40 mg Subcutaneous Q24H [name], MD   40 mg at 02/13/18 1937  . fludrocortisone (FLORINEF) tablet 0.1 mg  0.1 mg Oral Daily [name], MD   0.1 mg at 02/13/18 1119  . fluticasone (FLONASE) 50 MCG/ACT nasal spray 1 spray  1 spray Each Nare Daily [name], MD   1 spray at 02/13/18 1118  . folic acid (FOLVITE)  tablet 0.5 mg  500 mcg Oral Daily [name], MD   0.5 mg at 02/13/18 1119  . hydrALAZINE (APRESOLINE) injection 10 mg  10 mg Intravenous Q8H PRN [name], MD      . hydrocortisone (CORTEF) tablet 20 mg  20 mg Oral Daily [name], MD   20 mg at 02/13/18 1344  . hydroxychloroquine (PLAQUENIL) tablet 200 mg  200 mg Oral Daily [name], MD   200 mg at 02/13/18 1120  . morphine 2 MG/ML injection 2 mg  2 mg Intravenous Q4H PRN [name], MD   2 mg at 02/10/18 1358  . ondansetron (ZOFRAN) tablet 4 mg  4 mg Oral Q6H PRN [name], MD       Or  . ondansetron [facility]) injection 4 mg  4 mg Intravenous Q6H PRN [name], MD   4 mg at 02/12/18 0358  . oxyCODONE (Oxy IR/ROXICODONE) immediate release tablet 5 mg  5 mg Oral Q4H PRN [name], MD   5 mg at 02/14/18 0744  . pantoprazole (PROTONIX) EC tablet 40 mg  40 mg Oral Daily [name], MD   40 mg at 02/13/18 1119  . polyethylene glycol (MIRALAX / GLYCOLAX) packet 17 g  17 g Oral BID [name], MD   17 g at 02/12/18 1105  . polyvinyl alcohol (LIQUIFILM TEARS) 1.4 % ophthalmic solution 1 drop  1 drop Both Eyes BID PRN [name], MD      . potassium chloride SA (K-DUR,KLOR-CON) CR tablet 40 mEq  40 mEq Oral Q4H Schorr, Rhetta Mura, NP   40 mEq at 02/14/18 0506  . potassium chloride SA (K-DUR,KLOR-CON) CR tablet 40 mEq  40 mEq Oral Once Emokpae, Courage, MD      . saccharomyces boulardii (FLORASTOR) capsule 250 mg  250 mg Oral BID Meuth, Brooke A, PA-C   250 mg at 02/13/18 2141  . senna-docusate (Senokot-S) tablet 1 tablet  1 tablet Oral BID [name], MD   1 tablet at 02/13/18 2141  . sodium chloride flush (NS) 0.9 % injection 3 mL  3 mL Intravenous Q12H [name], MD   3 mL at 02/13/18 2144  . sodium chloride flush (NS) 0.9 % injection 3 mL  3 mL Intravenous PRN [name], MD         Discharge  Medications: Please see discharge summary for a list of discharge medications.  Relevant Imaging Results:  Relevant Lab Results:   Additional Information [id number] [place], [place]

## 2018-02-14 NOTE — Clinical Social Work Note (Signed)
Clinical Social Work Assessment  Patient Details  Name: John Lyons MRN: [id number] Date of Birth: 10-Aug-1945  Date of referral:  02/14/18               Reason for consult:  Facility Placement, Discharge Planning                Permission sought to share information with:  Facility Sport and exercise psychologist, Family Supports Permission granted to share information::  Yes, Verbal Permission Granted  Name::     John Lyons  Agency::  SNFs  Relationship::  daughter  Contact Information:  [phone]  Housing/Transportation Living arrangements for the past 2 months:  Single Family Home Source of Information:  Patient Patient Interpreter Needed:  None Criminal Activity/Legal Involvement Pertinent to Current Situation/Hospitalization:  No - Comment as needed Significant Relationships:  Adult Children, Community Support Lives with:  Adult Children, Minor Children Do you feel safe going back to the place where you live?  Yes Need for family participation in patient care:  Yes (Comment)  Care giving concerns:  Pt lives at home with his daughter and grandson, they are able to provide assistance but daughter works and pt feels he is weaker and more deconditioned than his baseline. Pt has been to SNF in 2017 and felt it helped him regain strength to return home.    Social Worker assessment / plan:  CSW spoke with pt at bedside, pt daughter also present. Pt daughter states she and her son (pt's grandson) live in [place] with patient. He feels like he has been doing well and is relatively independent at baseline but since surgery he feels weak and deconditioned. Pt feels that he would do well to have some short term rehab and then return home with assistance from family members. Pt preference is for [facility]- since it is close to his home and he was previously there in 2017.   Employment status:  [employer] information:  Medicare PT Recommendations:  24 Hour Supervision, [place] / Referral to community resources:  [place]  Patient/Family's Response to care:  Pt and pt daughter amenable to speaking with CSW. Both in agreement for SNF placement with preference to [facility], pt daughter had already called and confirmed bed availability.  Patient/Family's Understanding of and Emotional Response to Diagnosis, Current Treatment, and Prognosis:  Pt and pt daughter state understanding of diagnosis, current treatment and prognosis. Pt and pt daughter both prefer SNF since pt benefited from going there previously. Pt and pt daughter both emotionally appropriate throughout assessment, hopeful for pt to benefit from additional recovery time and PT and return home with support.   Emotional Assessment Appearance:  Appears stated age Attitude/Demeanor/Rapport:  Engaged, Gracious Affect (typically observed):  Accepting, Adaptable, Appropriate, Quiet, Calm Orientation:  Oriented to Self, Oriented to Place, Oriented to  Time, Oriented to Situation Alcohol / Substance use:  Not Applicable Psych involvement (Current and /or in the community):  No (Comment)  Discharge Needs  Concerns to be addressed:  Care Coordination Readmission within the last 30 days:  Yes Current discharge risk:  Physical Impairment Barriers to Discharge:  Continued Medical Work up   [employer], [place] 02/14/2018, 12:07 PM

## 2018-02-14 NOTE — Final Consult Note (Signed)
Consultant Final Sign-Off Note    Assessment/Final recommendations  John Lyons is a 72 y.o. male followed by [facility] for acute sigmoid diverticulitis. Has had bouts of diverticulitis many years ago that were treated outpatient, this is his first hospitalization for diverticulitis.  Abdominal pain now resolved. Afebrile, WBC Normalized. Having flatus. Tolerating clears. Recommend advancing diet to SOFT as tolerated. Transition to PO abx. Patient encouraged to schedule his colonoscopy in 6-8 weeks with his GI physician, Dr. Watt Climes.    Wound care (if applicable): N/A   Diet at discharge: low residue/low fiber diet for about one week. I have attached dietary info to DC info.   Activity at discharge: ad lib   Follow-up appointment:  Call our [place] office as needed.   Pending results:  Unresulted Labs (From admission, onward)    Start     Ordered   02/11/18 0500  CBC with Differential/Platelet  Daily,   R     02/10/18 1317   02/11/18 2951  Basic metabolic panel  Daily,   R     02/10/18 1317           Medication recommendations: discharge home on PO Augmentin to complete a 10-14 day total course of abx.   Other recommendations:     Thank you for allowing [place] to participate in the care of your patient!  Please consult [place] again if you have further needs for your patient.   [place] 02/14/2018 7:39 AM    Subjective   No new complaints- denies abd pain, c/o RA pain. Having flatus. Last BM was Tuesday. Tolerating clears. Worked with PT yesterday.  Objective  Vital signs in last 24 hours: Temp:  [99 F (37.2 C)-99.5 F (37.5 C)] 99 F (37.2 C) (10/24 0530) Pulse Rate:  [79-83] 83 (10/24 0530) Resp:  [16-18] 16 (10/24 0530) BP: (151-165)/(59-80) 165/80 (10/24 0530) SpO2:  [98 %-100 %] 98 % (10/24 0530)  General: alert cooperative NAD Cards: RRR, no m/r/g Pulm: normal effort Abd: soft, non-tender, non-distended, no rebound tenderness, no HSM, +BS    Pertinent labs  and Studies: Recent Labs    02/12/18 0401 02/13/18 0229 02/14/18 0317  WBC 12.4* 8.2 9.2  HGB 12.5* 10.9* 10.8*  HCT 37.3* 34.0* 33.4*   BMET Recent Labs    02/13/18 0229 02/14/18 0317  NA 139 137  K 3.4* 2.7*  CL 115* 111  CO2 17* 19*  GLUCOSE 72 85  BUN 16 7*  CREATININE 0.56* 0.46*  CALCIUM 7.3* 7.1*   No results for input(s): LABURIN in the last 72 hours. Results for orders placed or performed during the hospital encounter of 02/09/18  Blood Culture (routine x 2)     Status: None (Preliminary result)   Collection Time: 02/09/18 11:47 AM  Result Value Ref Range Status   Specimen Description BLOOD RIGHT ANTECUBITAL  Final   Special Requests   Final    BOTTLES DRAWN AEROBIC AND ANAEROBIC Blood Culture results may not be optimal due to an inadequate volume of blood received in culture bottles   Culture   Final    NO GROWTH 4 DAYS Performed at [facility] [address], [place], [place] [address]    Report Status PENDING  Incomplete  Blood Culture (routine x 2)     Status: None (Preliminary result)   Collection Time: 02/09/18 11:57 AM  Result Value Ref Range Status   Specimen Description BLOOD SITE NOT SPECIFIED  Final   Special Requests  Final    BOTTLES DRAWN AEROBIC AND ANAEROBIC Blood Culture results may not be optimal due to an inadequate volume of blood received in culture bottles   Culture   Final    NO GROWTH 4 DAYS Performed at [facility], [place] [address], [place], [place] [address]    Report Status PENDING  Incomplete  Urine culture     Status: None   Collection Time: 02/09/18  3:26 PM  Result Value Ref Range Status   Specimen Description URINE, CLEAN CATCH  Final   Special Requests NONE  Final   Culture   Final    NO GROWTH Performed at [facility], [place] [address] [place], [place] [address]    Report Status 02/10/2018 FINAL  Final    Imaging: No results found.

## 2018-02-14 NOTE — Progress Notes (Signed)
Physical Therapy Treatment Patient Details Name: John Lyons MRN: [id number] DOB: 05-07-1945 Today's Date: 02/14/2018    History of Present Illness John Lyons a 72 y.o.malewith medical history significant forAddison's disease, hypertension, hyperlipidemia, rheumatoid arthritis, diverticulosis, presents to the ER complaining of left lower quadrant/left flank sharp pain, ongoing for the past 2 days, 10/10inseverity at its worse,does notradiate, nothing makes it better or worse.Diagnosed with sepsis secondary to acute diverticulitis, possible partial bowel obstruction vs. Ileus, hypokalemia    PT Comments    Patient not progressing well today secondary to increase pain in bil shoulders, elbows, wrists due to RA flare up. Requires assist of 2 to stand from low chair today and to steady. Tolerated short ambulation bout with Min A but fatigues quickly. Highly motivated to participate in therapy despite pain. Discharge recommendation updated to SNF due to downgrade in mobility from prior session. Pt agreeable. Will follow.    Follow Up Recommendations  SNF;Supervision for mobility/OOB     Equipment Recommendations  None recommended by PT    Recommendations for Other Services       Precautions / Restrictions Precautions Precautions: Fall Restrictions Weight Bearing Restrictions: No    Mobility  Bed Mobility Overal bed mobility: Needs Assistance Bed Mobility: Supine to Sit     Supine to sit: Mod assist;HOB elevated     General bed mobility comments: up in chair upon PT arrival.   Transfers Overall transfer level: Needs assistance Equipment used: Rolling walker (2 wheeled) Transfers: Sit to/from Stand Sit to Stand: Max assist;+2 physical assistance Stand pivot transfers: Mod assist;+2 safety/equipment       General transfer comment: Not able to use LUE to push, limited use of RUE due to pain and lack of ROM. Assist of 2 to stand from low chair and to  steady.  Ambulation/Gait Ambulation/Gait assistance: Min assist Gait Distance (Feet): 20 Feet Assistive device: Rolling walker (2 wheeled) Gait Pattern/deviations: Decreased dorsiflexion - right;Decreased dorsiflexion - left;Step-through pattern;Trunk flexed;Narrow base of support Gait velocity: decreased   General Gait Details: Lft knee in near full extension throughout; noting some foot flat steps bilaterally; trunk slightly flexed; Limited by longstanding RA; not able to place weight well through [place]. Fatigues .   Stairs             Wheelchair Mobility    Modified Rankin (Stroke Patients Only)       Balance Overall balance assessment: Needs assistance Sitting-balance support: Feet supported;No upper extremity supported Sitting balance-Leahy Scale: Fair     Standing balance support: Bilateral upper extremity supported;During functional activity Standing balance-Leahy Scale: Poor Standing balance comment: Reliant on BUEs for support in standing.                             Cognition Arousal/Alertness: Awake/alert Behavior During Therapy: WFL for tasks assessed/performed Overall Cognitive Status: Within Functional Limits for tasks assessed                                        Exercises      General Comments General comments (skin integrity, edema, etc.): Daughter present during session.       Pertinent Vitals/Pain Pain Assessment: Faces Faces Pain Scale: Hurts whole lot Pain Location: bil shoulders, elbows, wrists Pain Descriptors / Indicators: Discomfort;Sore;Aching Pain Intervention(s): Monitored during session;Limited activity within patient's tolerance;Patient requesting pain meds-RN notified;Repositioned  Home Living Family/patient expects to be discharged to:: Private residence Living Arrangements: Children Available Help at Discharge: Family;Available PRN/intermittently Type of Home: House Home Access: Ramped  entrance   Home Layout: One level Home Equipment: Walker - 2 wheels;Shower seat      Prior Function Level of Independence: Needs assistance  Gait / Transfers Assistance Needed: consistently uses RW ADL's / Homemaking Assistance Needed: Walk-in shower with stool; reports he has everything setup within reach 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    PT Goals (current goals can now be found in the care plan section) Acute Rehab PT Goals Patient Stated Goal: rehab then home, back to work as soon as feasible Progress towards PT goals: Not progressing toward goals - comment(secondary to pain throughout UEs)    Frequency    Min 2X/week      PT Plan Discharge plan needs to be updated;Frequency needs to be updated    Co-evaluation              AM-PAC PT "6 Clicks" Daily Activity  Outcome Measure  Difficulty turning over in bed (including adjusting bedclothes, sheets and blankets)?: A Lot Difficulty moving from lying on back to sitting on the side of the bed? : Unable Difficulty sitting down on and standing up from a chair with arms (e.g., wheelchair, bedside commode, etc,.)?: Unable Help needed moving to and from a bed to chair (including a wheelchair)?: A Little Help needed walking in hospital room?: A Little Help needed climbing 3-5 steps with a railing? : A Lot 6 Click Score: 12    End of Session Equipment Utilized During Treatment: Gait belt Activity Tolerance: Patient limited by pain Patient left: in chair;with call bell/phone within reach;with family/visitor present Nurse Communication: Mobility status PT Visit Diagnosis: Unsteadiness on feet (R26.81);Other abnormalities of gait and mobility (R26.89);Muscle weakness (generalized) (M62.81);History of falling (Z91.81);Pain Pain - Right/Left: (bil) Pain - part of body: Shoulder;Hand;Arm     Time: 3016-0109 PT Time Calculation (min) (ACUTE ONLY): 18  min  Charges:  $Gait Training: 8-22 mins                     Wray Kearns, PT, DPT Acute Rehabilitation Services Pager [phone] Office [place] 02/14/2018, 12:01 PM

## 2018-02-14 NOTE — Evaluation (Signed)
Occupational Therapy Evaluation Patient Details Name: John Lyons MRN: [id number] DOB: [birth year] Today's Date: 02/14/2018    History of Present Illness Emerson Schreifels a 72 y.o.malewith medical history significant forAddison's disease, hypertension, hyperlipidemia, rheumatoid arthritis, diverticulosis, presents to the ER complaining of left lower quadrant/left flank sharp pain, ongoing for the past 2 days, 10/10inseverity at its worse,does notradiate, nothing makes it better or worse.Diagnosed with sepsis secondary to acute diverticulitis, possible partial bowel obstruction vs. Ileus, hypokalemia   Clinical Impression   Pt admitted with the above diagnoses and presents with below problem list. Pt will benefit from continued acute OT to address the below listed deficits and maximize independence with basic ADLs prior to d/c to venue below. PTA pt was mod I with ADLs. Pt is currently max A with LB ADLs, mod A with UB ADLs, mod +2 assist for safety with functional transfers. Pt limited by pain and generalized weakness this session.      Follow Up Recommendations  SNF    Equipment Recommendations  Other (comment)(defer to next venue)    Recommendations for Other Services       Precautions / Restrictions Precautions Precautions: Fall Restrictions Weight Bearing Restrictions: No      Mobility Bed Mobility Overal bed mobility: Needs Assistance Bed Mobility: Supine to Sit     Supine to sit: Mod assist;HOB elevated     General bed mobility comments: Assist to powerup and advance BLE. Bed pad utilized to pivot hips to full EOB position.   Transfers Overall transfer level: Needs assistance Equipment used: Rolling walker (2 wheeled) Transfers: Sit to/from [facility] Sit to Stand: Mod assist;+2 safety/equipment Stand pivot transfers: Mod assist;+2 safety/equipment       General transfer comment: Pt with difficulty fully gripping onto rw with both  hands. +2 for safety utilized this session. Tending towards trunk flexion with cues needed for more neutral standing position.     Balance Overall balance assessment: Needs assistance   Sitting balance-Leahy Scale: Fair     Standing balance support: Bilateral upper extremity supported;During functional activity Standing balance-Leahy Scale: Poor                             ADL either performed or assessed with clinical judgement   ADL Overall ADL's : Needs assistance/impaired Eating/Feeding: Set up;Sitting   Grooming: Sitting;Minimal assistance   Upper Body Bathing: Moderate assistance;Sitting   Lower Body Bathing: Sit to/from stand;Maximal assistance   Upper Body Dressing : Moderate assistance;Sitting   Lower Body Dressing: Sit to/from stand;Maximal assistance   Toilet Transfer: +2 for safety/equipment;Stand-pivot;BSC;RW;Moderate assistance   Toileting- Clothing Manipulation and Hygiene: Sit to/from stand;Maximal assistance   Tub/ Shower Transfer: +2 for safety/equipment;Stand-pivot;3 in 1;Rolling walker;Moderate assistance     General ADL Comments: Pt completed bed mobility and SPT from EOB to recliner.      Vision         Perception     Praxis      Pertinent Vitals/Pain Pain Assessment: Faces Faces Pain Scale: Hurts even more Pain Location: back and shoulders Pain Descriptors / Indicators: Aching Pain Intervention(s): Limited activity within patient's tolerance;Monitored during session;Repositioned     Hand Dominance Right   Extremity/Trunk Assessment Upper Extremity Assessment Upper Extremity Assessment: RUE deficits/detail;LUE deficits/detail;Generalized weakness RUE Deficits / Details: weak grip strength/coordination and baseline RA deficits noted LUE Deficits / Details: weak grip strength/coordination and baseline RA deficits noted   Lower Extremity Assessment Lower Extremity Assessment:  Defer to PT evaluation       Communication  Communication Communication: No difficulties   Cognition Arousal/Alertness: Awake/alert Behavior During Therapy: WFL for tasks assessed/performed Overall Cognitive Status: Within Functional Limits for tasks assessed                                     General Comments  Brother present and involved during session. He reports he lives out of town and sees his brother about once a month. Brother reports decline in pt's functional performance this session compared to baseline.    Exercises     Shoulder Instructions      Home Living Family/patient expects to be discharged to:: Private residence Living Arrangements: Children Available Help at Discharge: Family;Available PRN/intermittently Type of Home: House Home Access: Ramped entrance     Home Layout: One level     Bathroom Shower/Tub: Walk-in shower         Home Equipment: Environmental consultant - 2 wheels;Shower seat          Prior Functioning/Environment Level of Independence: Needs assistance  Gait / Transfers Assistance Needed: consistently uses RW ADL's / Homemaking Assistance Needed: Walk-in shower with stool; reports he has everything setup within reach   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         OT Problem List: Decreased activity tolerance;Decreased strength;Impaired balance (sitting and/or standing);Decreased coordination;Decreased knowledge of use of DME or AE;Decreased knowledge of precautions;Impaired UE functional use;Pain      OT Treatment/Interventions: Self-care/ADL training;Therapeutic exercise;DME and/or AE instruction;Therapeutic activities;Patient/family education;Balance training    OT Goals(Current goals can be found in the care plan section) Acute Rehab OT Goals Patient Stated Goal: rehab then home, back to work as soon as feasible OT Goal Formulation: With patient/family Time For Goal Achievement: 02/28/18 Potential to Achieve  Goals: Good ADL Goals Pt Will Perform Upper Body Bathing: with set-up;sitting Pt Will Perform Lower Body Bathing: sit to/from stand;with supervision Pt Will Perform Upper Body Dressing: with set-up;sitting Pt Will Perform Lower Body Dressing: with supervision;sit to/from stand Pt Will Transfer to Toilet: with supervision;ambulating Pt Will Perform Toileting - Clothing Manipulation and hygiene: with supervision;sit to/from stand;sitting/lateral leans Additional ADL Goal #1: Pt will complete bed mobility at supervision level to prepare for OOB ADLs.  OT Frequency: Min 2X/week   Barriers to D/C:            Co-evaluation              AM-PAC PT "6 Clicks" Daily Activity     Outcome Measure Help from another person eating meals?: None Help from another person taking care of personal grooming?: A Little Help from another person toileting, which includes using toliet, bedpan, or urinal?: A Lot Help from another person bathing (including washing, rinsing, drying)?: A Lot Help from another person to put on and taking off regular upper body clothing?: A Little Help from another person to put on and taking off regular lower body clothing?: A Lot 6 Click Score: 16   End of Session Equipment Utilized During Treatment: Rolling walker;Gait belt  Activity Tolerance: Patient limited by pain;Patient limited by fatigue Patient left: in chair;with call bell/phone within reach;with family/visitor present  OT Visit Diagnosis: Unsteadiness on feet (R26.81);Pain;Muscle weakness (generalized) (M62.81)                Time: 7741-2878  OT Time Calculation (min): 22 min Charges:  OT General Charges $OT Visit: 1 Visit OT Evaluation $OT Eval Low Complexity: [place], OT Acute Rehabilitation Services Pager: [phone] Office: [phone]   Hortencia Pilar 02/14/2018, 11:13 AM

## 2018-02-15 ENCOUNTER — Inpatient Hospital Stay (HOSPITAL_COMMUNITY): Payer: Medicare Other

## 2018-02-15 LAB — CBC WITH DIFFERENTIAL/PLATELET
Abs Immature Granulocytes: 0.05 10*3/uL (ref 0.00–0.07)
BASOS ABS: 0 10*3/uL (ref 0.0–0.1)
Basophils Relative: 0 %
EOS ABS: 0.2 10*3/uL (ref 0.0–0.5)
EOS PCT: 2 %
HCT: 36.1 % — ABNORMAL LOW (ref 39.0–52.0)
HEMOGLOBIN: 12.2 g/dL — AB (ref 13.0–17.0)
IMMATURE GRANULOCYTES: 0 %
LYMPHS PCT: 15 %
Lymphs Abs: 1.7 10*3/uL (ref 0.7–4.0)
MCH: 28.7 pg (ref 26.0–34.0)
MCHC: 33.8 g/dL (ref 30.0–36.0)
MCV: 84.9 fL (ref 80.0–100.0)
MONOS PCT: 8 %
Monocytes Absolute: 0.9 10*3/uL (ref 0.1–1.0)
NRBC: 0 % (ref 0.0–0.2)
Neutro Abs: 8.5 10*3/uL — ABNORMAL HIGH (ref 1.7–7.7)
Neutrophils Relative %: 75 %
Platelets: 450 10*3/uL — ABNORMAL HIGH (ref 150–400)
RBC: 4.25 MIL/uL (ref 4.22–5.81)
RDW: 12.7 % (ref 11.5–15.5)
WBC: 11.4 10*3/uL — ABNORMAL HIGH (ref 4.0–10.5)

## 2018-02-15 LAB — BASIC METABOLIC PANEL
ANION GAP: 9 (ref 5–15)
BUN: <5 mg/dL — ABNORMAL LOW (ref 8–23)
CHLORIDE: 106 mmol/L (ref 98–111)
CO2: 23 mmol/L (ref 22–32)
Calcium: 7.6 mg/dL — ABNORMAL LOW (ref 8.9–10.3)
Creatinine, Ser: 0.5 mg/dL — ABNORMAL LOW (ref 0.61–1.24)
GFR calc non Af Amer: >60 mL/min (ref >60)
GLUCOSE: 87 mg/dL (ref 70–99)
Potassium: 3.2 mmol/L — ABNORMAL LOW (ref 3.5–5.1)
Sodium: 138 mmol/L (ref 135–145)

## 2018-02-15 MED ORDER — POTASSIUM CHLORIDE CRYS ER 20 MEQ PO TBCR
40.0000 meq | EXTENDED_RELEASE_TABLET | Freq: Once | ORAL | Status: AC
  Administered 2018-02-15: 40 meq via ORAL
  Filled 2018-02-15: qty 2

## 2018-02-15 MED ORDER — PIPERACILLIN-TAZOBACTAM 3.375 G IVPB
3.3750 g | Freq: Three times a day (TID) | INTRAVENOUS | Status: DC
  Administered 2018-02-15 – 2018-02-18 (×9): 3.375 g via INTRAVENOUS
  Filled 2018-02-15 (×8): qty 50

## 2018-02-15 MED ORDER — AMLODIPINE BESYLATE 10 MG PO TABS
10.0000 mg | ORAL_TABLET | Freq: Every day | ORAL | Status: DC
  Administered 2018-02-16 – 2018-02-18 (×2): 10 mg via ORAL
  Filled 2018-02-15 (×2): qty 1

## 2018-02-15 MED ORDER — AMLODIPINE BESYLATE 5 MG PO TABS
5.0000 mg | ORAL_TABLET | Freq: Once | ORAL | Status: AC
  Administered 2018-02-15: 5 mg via ORAL
  Filled 2018-02-15: qty 1

## 2018-02-15 MED ORDER — PIPERACILLIN-TAZOBACTAM 3.375 G IVPB 30 MIN
3.3750 g | Freq: Once | INTRAVENOUS | Status: DC
  Filled 2018-02-15: qty 50

## 2018-02-15 MED ORDER — SODIUM CHLORIDE 0.9 % IV SOLN
500.0000 mg | INTRAVENOUS | Status: DC
  Administered 2018-02-15 – 2018-02-17 (×3): 500 mg via INTRAVENOUS
  Filled 2018-02-15 (×4): qty 500

## 2018-02-15 NOTE — Progress Notes (Signed)
Pharmacy Antibiotic Note  John Lyons is a 72 y.o. male admitted on 02/09/2018 with flank pain and concern for pyelo vs intra-abdominal infection. Pharmacy has been consulted for Zosyn dosing. SCr stable wnl.  Plan: -Start Zosyn 3.375g IV q8h (4h infusion) - Will continue to follow renal function, culture results, LOT, and antibiotic de-escalation plans   Height: 5\' 3"  (160 cm) Weight: 129 lb 6.6 oz (58.7 kg) IBW/kg (Calculated) : 56.9  Temp (24hrs), Avg:100.1 F (37.8 C), Min:98.5 F (36.9 C), Max:102.2 F (39 C)  Recent Labs  Lab 02/09/18 1104 02/09/18 1353  02/11/18 0253 02/12/18 0401 02/13/18 0229 02/14/18 0317 02/15/18 0505  WBC  --   --    < > 13.7* 12.4* 8.2 9.2 11.4*  CREATININE  --   --    < > 0.47* 0.54* 0.56* 0.46* 0.50*  LATICACIDVEN 2.01* 1.05  --   --   --   --   --   --    < > = values in this interval not displayed.    Estimated Creatinine Clearance: 65.2 mL/min (A) (by C-G formula based on SCr of 0.5 mg/dL (L)).    Allergies  Allergen Reactions  . Adhesive [Tape] Rash  . Phenobarbital Rash    Cicely Ortner A. Levada Dy, PharmD, [place] Pager: [phone] Please utilize Amion for appropriate phone number to reach the unit pharmacist ([place])   02/15/2018 4:28 PM

## 2018-02-15 NOTE — Progress Notes (Signed)
Patient Demographics:    John Lyons, is a 72 y.o. male, DOB - [birth year], KZL:[id number]  Admit date - 02/09/2018   Admitting Physician [name], MD  Outpatient Primary MD for the patient is [name], MD  LOS - 6   Chief Complaint  Patient presents with  . Flank Pain        Subjective:    [name] today has fevers, chills and abdominal discomfort  Assessment  & Plan :    Principal Problem:   Acute diverticulitis Active Problems:   Essential hypertension   Addison's disease ([place])   Rheumatoid arthritis ([place])   GERD (gastroesophageal reflux disease)  Brief summary 72 y.o. male with medical history significant for Addison's disease, hypertension, hyperlipidemia, rheumatoid arthritis, diverticulosis admitted on 02/09/2018 with abdominal pain and fevers and clinical and imaging findings consistent with diverticulitis. Patient met sepsis criteria with fevers, leukocytosis and lactic acidosis.  Urine and Blood culture negative   Plan:- 1)Sepsis 2/2 acute Diverticulitis-   White count trending back up above 11 down, patient now with persistent fevers above 102, appears patient may not have received IV Zosyn, discussed with pharmacist, Zosyn reordered  On 02/15/18, blood cultures from 02/14/2018 neg so far , plan to to discharge to SNF  on p.o. Augmentin, continue probiotic, use PRN pain medications and antiemetics, advance diet to full liquid  2)Possible partial bowel obstruction Vs ileus-  resolved emesis, diarrhea has resolved,, advance diet to full liquids surgical input appreciated, may advance diet as tolerated per surgical team, continue to hold Linzess, repeat abdominal x-rays on 1025 219 without acute abdominal findings  3)FEN/Hypokalemia---- replace and recheck, tolerating full liquid diet well persistent/recurrent hypokalemia may be related to Florinef  use  4)Addison's disease- , patient received stress dose of Solu-Cortef, continue Florinef 0.1 mg daily, and hydrocortisone 20 mg daily ,,   5)Hypertension- stable, restart amlodipine at 5 mg daily, continue to hold Telmisartan for now,  may use IV Hydralazine 10 mg  Every 4 hours Prn for systolic blood pressure over 160 mmhg  6)Dysrhythmia-stable, asymptomatic, continue digoxin  7)Rheumatoid arthritis-  Continue Plaquenil andfolic acid, complains of arthralgia, already on hydrocortisone  8)Mobility related challenges-----patient has generalized weakness, fatigue, has doing difficulties with mobility related activities of daily living, patient has rheumatoid arthritis complains of arthralgia and stiffness, PT/OT recommends SNF rehab  10)Possible Left-sided Pneumonia (LLL)--- added azithromycin to Zosyn, mucinex, albuterol prn   Code Status: DNR   Family Communication:  brother at bedside  Disposition Plan:  SNF   Consultants: General surgery/dietitian  Procedures:  None   Antimicrobials:  Cefepime- stopped 02/13/18  Flagyl -stopped 02/13/18  Zosyn started 02/13/18, restarted 02/15/18  Disposition/Need for in-Hospital Stay- patient unable to be discharged at this time due to persistent fevers and  need for SNF Rehab   DVT Prophylaxis  :  Lovenox  Lab Results  Component Value Date   PLT 450 (H) 02/15/2018    Inpatient Medications  Scheduled Meds: . ALPRAZolam  1 mg Oral QHS  . amLODipine  10 mg Oral Daily  . dextromethorphan-guaiFENesin  1 tablet Oral BID  . digoxin  0.0625 mg Oral Daily  . enoxaparin (LOVENOX) injection  40 mg Subcutaneous Q24H  . fludrocortisone  0.1 mg  Oral Daily  . fluticasone  1 spray Each Nare Daily  . folic acid  536 mcg Oral Daily  . hydrocortisone  20 mg Oral Daily  . hydroxychloroquine  200 mg Oral Daily  . pantoprazole  40 mg Oral Daily  . polyethylene glycol  17 g Oral BID  . saccharomyces boulardii  250 mg Oral BID  .  senna-docusate  1 tablet Oral BID  . sodium chloride flush  3 mL Intravenous Q12H   Continuous Infusions: . sodium chloride 30 mL/hr at 02/14/18 1811  . sodium chloride    . azithromycin    . piperacillin-tazobactam     PRN Meds:.sodium chloride, acetaminophen **OR** acetaminophen, albuterol, hydrALAZINE, morphine injection, ondansetron **OR** ondansetron (ZOFRAN) IV, oxyCODONE, polyvinyl alcohol, sodium chloride flush    Anti-infectives (From admission, onward)   Start     Dose/Rate Route Frequency Ordered Stop   02/15/18 1630  piperacillin-tazobactam (ZOSYN) IVPB 3.375 g     3.375 g 100 mL/hr over 30 Minutes Intravenous  Once 02/15/18 1615     02/15/18 1615  azithromycin (ZITHROMAX) 500 mg in sodium chloride 0.9 % 250 mL IVPB     500 mg 250 mL/hr over 60 Minutes Intravenous Every 24 hours 02/15/18 1614     02/10/18 0000  metroNIDAZOLE (FLAGYL) IVPB 500 mg  Status:  Discontinued     500 mg 100 mL/hr over 60 Minutes Intravenous Every 8 hours 02/09/18 1737 02/13/18 0813   02/09/18 2200  ceFEPIme (MAXIPIME) 2 g in sodium chloride 0.9 % 100 mL IVPB  Status:  Discontinued     2 g 200 mL/hr over 30 Minutes Intravenous Every 12 hours 02/09/18 1811 02/13/18 0813   02/09/18 1745  hydroxychloroquine (PLAQUENIL) tablet 200 mg     200 mg Oral Daily 02/09/18 1744     02/09/18 1645  metroNIDAZOLE (FLAGYL) IVPB 500 mg     500 mg 100 mL/hr over 60 Minutes Intravenous  Once 02/09/18 1641 02/09/18 1827   02/09/18 1115  cefTRIAXone (ROCEPHIN) 1 g in sodium chloride 0.9 % 100 mL IVPB  Status:  Discontinued     1 g 200 mL/hr over 30 Minutes Intravenous Every 24 hours 02/09/18 1112 02/09/18 1811        Objective:   Vitals:   02/15/18 0447 02/15/18 0947 02/15/18 1329 02/15/18 1516  BP: (!) 161/80 (!) 186/69 (!) 151/72   Pulse: 99 (!) 102 (!) 107   Resp: 16 16    Temp: 98.5 F (36.9 C) 98.6 F (37 C) (!) 101.7 F (38.7 C) (!) 102.2 F (39 C)  TempSrc: Oral Oral Oral Oral  SpO2: 95% 100%  92%   Weight:      Height:        Wt Readings from Last 3 Encounters:  02/09/18 58.7 kg  01/01/18 59.2 kg  11/15/15 59 kg     Intake/Output Summary (Last 24 hours) at 02/15/2018 1625 Last data filed at 02/15/2018 0902 Gross per 24 hour  Intake 662.53 ml  Output 700 ml  Net -37.47 ml    Physical Exam Patient is examined daily including today on 02/15/18 , exams remain the same as of yesterday except that has changed   Gen:- Awake Alert,  In no apparent distress  HEENT:- [place].AT, No sclera icterus Neck-Supple Neck,No JVD,.  Lungs-diminished in bases left more than right, no wheezing CV- S1, S2 normal, regular Abd-  +ve B.Sounds, Abd Soft, not distended, no significant abdominal tenderness Extremity/Skin:- No  edema,   good  pulses, polyarthralgia without obvious/significant inflammatory changes over the joints Psych-affect is appropriate, oriented x3 Neuro-no new focal deficits, no tremors   Data Review:   Micro Results Recent Results (from the past 240 hour(s))  Blood Culture (routine x 2)     Status: None   Collection Time: 02/09/18 11:47 AM  Result Value Ref Range Status   Specimen Description BLOOD RIGHT ANTECUBITAL  Final   Special Requests   Final    BOTTLES DRAWN AEROBIC AND ANAEROBIC Blood Culture results may not be optimal due to an inadequate volume of blood received in culture bottles   Culture   Final    NO GROWTH 5 DAYS Performed at [facility], [place] [address], [place], [place] [address]    Report Status 02/14/2018 FINAL  Final  Blood Culture (routine x 2)     Status: None   Collection Time: 02/09/18 11:57 AM  Result Value Ref Range Status   Specimen Description BLOOD SITE NOT SPECIFIED  Final   Special Requests   Final    BOTTLES DRAWN AEROBIC AND ANAEROBIC Blood Culture results may not be optimal due to an inadequate volume of blood received in culture bottles   Culture   Final    NO GROWTH 5 DAYS Performed at [facility], [place]  [address] [place], [place] [address]    Report Status 02/14/2018 FINAL  Final  Urine culture     Status: None   Collection Time: 02/09/18  3:26 PM  Result Value Ref Range Status   Specimen Description URINE, CLEAN CATCH  Final   Special Requests NONE  Final   Culture   Final    NO GROWTH Performed at [facility], [place] [address], [place], [place] [address]    Report Status 02/10/2018 FINAL  Final  Culture, blood (Routine X 2) w Reflex to ID Panel     Status: None (Preliminary result)   Collection Time: 02/14/18  4:22 PM  Result Value Ref Range Status   Specimen Description BLOOD LEFT HAND  Final   Special Requests   Final    BOTTLES DRAWN AEROBIC AND ANAEROBIC Blood Culture adequate volume   Culture   Final    NO GROWTH < 24 HOURS Performed at [facility], [place] [address], [place], [place] [address]    Report Status PENDING  Incomplete    Radiology Reports Dg Chest 2 View  Result Date: 02/09/2018 CLINICAL DATA:  Severe back pain with fever 3 days. EXAM: CHEST - 2 VIEW COMPARISON:  01/08/2018 and CT 01/09/2018 FINDINGS: Lungs are adequately inflated without focal airspace consolidation or effusion. Cardiomediastinal silhouette is within normal. There is degenerative change of the spine with a stable mild compression deformity over the mid to lower thoracic spine. Moderate degenerative change of the shoulders. IMPRESSION: No acute cardiopulmonary disease. Stable mild compression fracture over the mid to lower thoracic spine. Electronically Signed   By: Marin Olp M.D.   On: 02/09/2018 12:59   Dg Abd Acute W/chest  Result Date: 02/15/2018 CLINICAL DATA:  Abdominal pain. EXAM: DG ABDOMEN ACUTE W/ 1V CHEST COMPARISON:  Radiographs dated 02/11/2018 and 02/09/2018 and CT scan abdomen dated 02/09/2018 FINDINGS: There is a new focal area of infiltrate and atelectasis at the left lung base medially. Heart size and vascularity are normal. Bowel gas pattern is normal. No visible free  air or free fluid in the abdomen. Chronic lucency around the right acetabular component of the total hip  prosthesis. Left total hip prosthesis appears unchanged. IMPRESSION: New focal area of infiltrate/atelectasis at the left lung base posterior medially. No other acute abnormalities. Electronically Signed   By: Lorriane Shire M.D.   On: 02/15/2018 15:00   Dg Abd Portable 2v  Result Date: 02/11/2018 CLINICAL DATA:  Severe constipation EXAM: PORTABLE ABDOMEN - 2 VIEW COMPARISON:  CT 02/09/2018, pelvis radiograph 11/01/2015 FINDINGS: Patchy atelectasis at the left base with possible tiny left effusion. Development of dilated small bowel up to 3.2 cm with increased gaseous enlargement of right lower quadrant small and large bowel. Some distal colon gas is present. There are bilateral hip replacements. No definite free air beneath the diaphragm. IMPRESSION: Interim development of dilated small and large bowel since previous CT with prominent gas-filled loops of bowel in the right lower quadrant. Findings could be secondary to an ileus, however partial bowel obstruction is also a concern. Electronically Signed   By: Donavan Foil M.D.   On: 02/11/2018 17:19   Ct Renal Stone Study  Result Date: 02/09/2018 CLINICAL DATA:  Left flank pain and hematuria beginning 2 days ago. Stone disease suspected. EXAM: CT ABDOMEN AND PELVIS WITHOUT CONTRAST TECHNIQUE: Multidetector CT imaging of the abdomen and pelvis was performed following the standard protocol without IV contrast. COMPARISON:  CT of the abdomen and pelvis 08/11/2015 FINDINGS: Lower chest: Mild dependent atelectasis is present in the lower lobes bilaterally. No significant effusion is present. Coronary artery calcifications are present. Heart size is normal. Blood pool is hypodense consistent with borderline anemia. Hepatobiliary: 2 subcentimeter lesions in the left lobe of the liver are stable. Likely represent small cysts. Patient is status post  cholecystectomy. Common bile duct is within normal limits. Pancreas: Unremarkable. No pancreatic ductal dilatation or surrounding inflammatory changes. Spleen: Normal in size without focal abnormality. Adrenals/Urinary Tract: The adrenal glands are within normal limits bilaterally. Kidneys are unremarkable. There is no stone or mass lesion. No hydronephrosis is present. The ureters are within normal limits bilaterally. Distal ureters are obscured by streak artifact from bilateral hip prostheses. Stomach/Bowel: A small hiatal hernia is present. The stomach and duodenum are otherwise within normal limits. Small bowel is unremarkable. Terminal ileum is within normal limits. The appendix is visualized and normal. Moderate stool is present in the cecum and ascending colon. Transverse colon is within normal limits. Diverticular changes are present in the distal descending and sigmoid colon. Although the area is somewhat obscured by streak artifact, there appears to be some stranding about the sigmoid colon. No definite free air or abscess formation is present. Vascular/Lymphatic: Atherosclerotic calcifications are present in the aorta and branch vessels without aneurysm. No significant adenopathy is present. No significant adenopathy is present. Reproductive: Prostate is unremarkable. Other: No abdominal wall hernia or abnormality. No abdominopelvic ascites. Musculoskeletal: Bilateral total hip arthroplasty is present. Degenerative changes are noted in the lumbar spine with levoconvex curvature centered at L4-5. Facet degenerative changes are greatest at L4-5 and L5-S1. No focal lytic or blastic lesions are present. IMPRESSION: 1. Diffuse descending and sigmoid diverticulosis with probable inflammatory changes about the sigmoid colon suggesting diverticulitis. The area is somewhat obscured by streak artifact from bilateral hip prostheses. No complicating features are evident. 2. No nephrolithiasis or hydronephrosis. 3.   Aortic Atherosclerosis (ICD10-I70.0). 4. Coronary artery disease. 5. Mild dependent atelectasis at the lung bases bilaterally. Electronically Signed   By: San Morelle M.D.   On: 02/09/2018 16:19     CBC Recent Labs  Lab 02/11/18 0253 02/12/18 0401 02/13/18  6160 02/14/18 0317 02/15/18 0505  WBC 13.7* 12.4* 8.2 9.2 11.4*  HGB 11.6* 12.5* 10.9* 10.8* 12.2*  HCT 34.5* 37.3* 34.0* 33.4* 36.1*  PLT 379 444* 373 400 450*  MCV 86.0 86.1 88.1 86.8 84.9  MCH 28.9 28.9 28.2 28.1 28.7  MCHC 33.6 33.5 32.1 32.3 33.8  RDW 12.1 12.3 12.5 12.3 12.7  LYMPHSABS 0.9 1.9 1.5 1.7 1.7  MONOABS 0.7 1.0 0.6 0.8 0.9  EOSABS 0.0 0.0 0.1 0.2 0.2  BASOSABS 0.0 0.0 0.0 0.0 0.0    Chemistries  Recent Labs  Lab 02/09/18 1915  02/11/18 0253 02/12/18 0401 02/13/18 0229 02/14/18 0317 02/15/18 0505  NA  --    < > 136 138 139 137 138  K  --    < > 2.7* 2.9* 3.4* 2.7* 3.2*  CL  --    < > 108 108 115* 111 106  CO2  --    < > 21* 21* 17* 19* 23  GLUCOSE  --    < > 132* 88 72 85 87  BUN  --    < > '11 11 16 '$ 7* <5*  CREATININE  --    < > 0.47* 0.54* 0.56* 0.46* 0.50*  CALCIUM  --    < > 7.8* 7.7* 7.3* 7.1* 7.6*  MG  --   --   --   --  2.0 1.7  --   AST 16  --   --   --   --   --   --   ALT 11  --   --   --   --   --   --   ALKPHOS 64  --   --   --   --   --   --   BILITOT 1.7*  --   --   --   --   --   --    < > = values in this interval not displayed.   ------------------------------------------------------------------------------------------------------------------ No results for input(s): CHOL, HDL, LDLCALC, TRIG, CHOLHDL, LDLDIRECT in the last 72 hours.  No results found for: HGBA1C ------------------------------------------------------------------------------------------------------------------ No results for input(s): TSH, T4TOTAL, T3FREE, THYROIDAB in the last 72 hours.  Invalid input(s):  FREET3 ------------------------------------------------------------------------------------------------------------------ No results for input(s): VITAMINB12, FOLATE, FERRITIN, TIBC, IRON, RETICCTPCT in the last 72 hours.  Coagulation profile No results for input(s): INR, PROTIME in the last 168 hours.  No results for input(s): DDIMER in the last 72 hours.  Cardiac Enzymes No results for input(s): CKMB, TROPONINI, MYOGLOBIN in the last 168 hours.  Invalid input(s): CK ------------------------------------------------------------------------------------------------------------------ No results found for: BNP   Roxan Hockey M.D on 02/15/2018 at 4:25 PM  Pager---[phone] Go to [email or web address] - password [email or web address] for contact info  [facility] - Office  [phone]

## 2018-02-16 LAB — CBC WITH DIFFERENTIAL/PLATELET
ABS IMMATURE GRANULOCYTES: 0.03 10*3/uL (ref 0.00–0.07)
BASOS ABS: 0 10*3/uL (ref 0.0–0.1)
Basophils Relative: 0 %
EOS ABS: 0.3 10*3/uL (ref 0.0–0.5)
Eosinophils Relative: 3 %
HCT: 33.6 % — ABNORMAL LOW (ref 39.0–52.0)
Hemoglobin: 11.3 g/dL — ABNORMAL LOW (ref 13.0–17.0)
IMMATURE GRANULOCYTES: 0 %
Lymphocytes Relative: 18 %
Lymphs Abs: 1.9 10*3/uL (ref 0.7–4.0)
MCH: 28.8 pg (ref 26.0–34.0)
MCHC: 33.6 g/dL (ref 30.0–36.0)
MCV: 85.5 fL (ref 80.0–100.0)
Monocytes Absolute: 0.8 10*3/uL (ref 0.1–1.0)
Monocytes Relative: 8 %
NEUTROS ABS: 7.4 10*3/uL (ref 1.7–7.7)
NEUTROS PCT: 71 %
NRBC: 0 % (ref 0.0–0.2)
Platelets: 428 10*3/uL — ABNORMAL HIGH (ref 150–400)
RBC: 3.93 MIL/uL — AB (ref 4.22–5.81)
RDW: 12.7 % (ref 11.5–15.5)
WBC: 10.4 10*3/uL (ref 4.0–10.5)

## 2018-02-16 MED ORDER — POTASSIUM CHLORIDE CRYS ER 20 MEQ PO TBCR
40.0000 meq | EXTENDED_RELEASE_TABLET | Freq: Once | ORAL | Status: AC
  Administered 2018-02-16: 40 meq via ORAL
  Filled 2018-02-16: qty 2

## 2018-02-16 NOTE — Plan of Care (Signed)
  Problem: Activity: Goal: Risk for activity intolerance will decrease Outcome: Progressing   Problem: Pain Managment: Goal: General experience of comfort will improve Outcome: Progressing   

## 2018-02-16 NOTE — Progress Notes (Signed)
Patient Demographics:    John Lyons, is a 72 y.o. male, DOB - 1945-10-01, NUU:[id number]  Admit date - 02/09/2018   Admitting Physician [name], MD  Outpatient Primary MD for the patient is [name], MD  LOS - 7   Chief Complaint  Patient presents with  . Flank Pain        Subjective:    [name] today has no further fevers, had loose BM   Assessment  & Plan :    Principal Problem:   Acute diverticulitis Active Problems:   Essential hypertension   Addison's disease ([place])   Rheumatoid arthritis ([place])   GERD (gastroesophageal reflux disease)  Brief summary 72 y.o. male with medical history significant for Addison's disease, hypertension, hyperlipidemia, rheumatoid arthritis, diverticulosis admitted on 02/09/2018 with abdominal pain and fevers and clinical and imaging findings consistent with diverticulitis. Patient met sepsis criteria with fevers, leukocytosis and lactic acidosis.  Urine and Blood culture negative   Plan:- 1)Sepsis 2/2 acute Diverticulitis-   since restarting IV Zosyn fevers appears to be resolving, and  white count is trending back down, T-max 102.2 around 3 PM on 02/15/2018, currently afebrile White. , appears patient may not have received IV Zosyn, discussed with pharmacist, [place] reordered  On 02/15/18, blood cultures from 02/14/2018 neg so far , plan to to discharge to SNF  on p.o. Augmentin, continue probiotic, use PRN pain medications and antiemetics, advance diet further to Gi soft  2)Possible partial bowel obstruction Vs ileus-  resolved emesis, diarrhea has resolved, advance diet further to Gi soft , surgical input appreciated,continue to hold Linzess, repeat abdominal x-rays on 02/15/18 without acute abdominal findings  3)FEN/Hypokalemia---- replace and recheck, tolerating full liquid diet well persistent/recurrent hypokalemia may be related  to Florinef use  4)Addison's disease- , patient received stress dose of Solu-Cortef, continue Florinef 0.1 mg daily, and hydrocortisone 20 mg daily ,,   5)Hypertension- stable, continue amlodipine 5 mg daily, continue to hold Telmisartan for now,  may use IV Hydralazine 10 mg  Every 4 hours Prn for systolic blood pressure over 160 mmhg  6)Dysrhythmia-stable, asymptomatic, continue digoxin  7)Rheumatoid arthritis-  Continue Plaquenil andfolic acid, complains of arthralgia, already on hydrocortisone  8)Mobility related challenges-----patient has generalized weakness, fatigue, has doing difficulties with mobility related activities of daily living, patient has rheumatoid arthritis complains of arthralgia and stiffness, PT/OT recommends SNF rehab  10)Possible Left-sided Pneumonia (LLL)--- fever has resolved c/n  azithromycin to Zosyn, mucinex, albuterol prn   Code Status: DNR   Family Communication:  Previously discussed with patient's brother and patient's sister  Disposition Plan:  SNF   Consultants: General surgery/dietitian/pharmacy  Procedures:  None   Antimicrobials:  Cefepime- stopped 02/13/18  Flagyl -stopped 02/13/18  Zosyn started 02/13/18, restarted 02/15/18  Disposition/Need for in-Hospital Stay- patient unable to be discharged at this time due to persistent fevers and  need for SNF Rehab   DVT Prophylaxis  :  Lovenox  Lab Results  Component Value Date   PLT 428 (H) 02/16/2018    Inpatient Medications  Scheduled Meds: . ALPRAZolam  1 mg Oral QHS  . amLODipine  10 mg Oral Daily  . dextromethorphan-guaiFENesin  1 tablet Oral BID  . digoxin  0.0625 mg Oral Daily  .  enoxaparin (LOVENOX) injection  40 mg Subcutaneous Q24H  . fludrocortisone  0.1 mg Oral Daily  . fluticasone  1 spray Each Nare Daily  . folic acid  817 mcg Oral Daily  . hydrocortisone  20 mg Oral Daily  . hydroxychloroquine  200 mg Oral Daily  . pantoprazole  40 mg Oral Daily  .  polyethylene glycol  17 g Oral BID  . saccharomyces boulardii  250 mg Oral BID  . senna-docusate  1 tablet Oral BID  . sodium chloride flush  3 mL Intravenous Q12H   Continuous Infusions: . sodium chloride 30 mL/hr at 02/16/18 1149  . sodium chloride 250 mL (02/15/18 1745)  . azithromycin 250 mL/hr at 02/15/18 1800  . piperacillin-tazobactam (ZOSYN)  IV 3.375 g (02/16/18 0553)   PRN Meds:.sodium chloride, acetaminophen **OR** acetaminophen, albuterol, hydrALAZINE, morphine injection, ondansetron **OR** ondansetron (ZOFRAN) IV, oxyCODONE, polyvinyl alcohol, sodium chloride flush    Anti-infectives (From admission, onward)   Start     Dose/Rate Route Frequency Ordered Stop   02/15/18 1630  piperacillin-tazobactam (ZOSYN) IVPB 3.375 g  Status:  Discontinued     3.375 g 100 mL/hr over 30 Minutes Intravenous  Once 02/15/18 1615 02/15/18 1627   02/15/18 1630  piperacillin-tazobactam (ZOSYN) IVPB 3.375 g     3.375 g 12.5 mL/hr over 240 Minutes Intravenous Every 8 hours 02/15/18 1627     02/15/18 1615  azithromycin (ZITHROMAX) 500 mg in sodium chloride 0.9 % 250 mL IVPB     500 mg 250 mL/hr over 60 Minutes Intravenous Every 24 hours 02/15/18 1614     02/10/18 0000  metroNIDAZOLE (FLAGYL) IVPB 500 mg  Status:  Discontinued     500 mg 100 mL/hr over 60 Minutes Intravenous Every 8 hours 02/09/18 1737 02/13/18 0813   02/09/18 2200  ceFEPIme (MAXIPIME) 2 g in sodium chloride 0.9 % 100 mL IVPB  Status:  Discontinued     2 g 200 mL/hr over 30 Minutes Intravenous Every 12 hours 02/09/18 1811 02/13/18 0813   02/09/18 1745  hydroxychloroquine (PLAQUENIL) tablet 200 mg     200 mg Oral Daily 02/09/18 1744     02/09/18 1645  metroNIDAZOLE (FLAGYL) IVPB 500 mg     500 mg 100 mL/hr over 60 Minutes Intravenous  Once 02/09/18 1641 02/09/18 1827   02/09/18 1115  cefTRIAXone (ROCEPHIN) 1 g in sodium chloride 0.9 % 100 mL IVPB  Status:  Discontinued     1 g 200 mL/hr over 30 Minutes Intravenous Every 24  hours 02/09/18 1112 02/09/18 1811        Objective:   Vitals:   02/15/18 1329 02/15/18 1516 02/15/18 2222 02/16/18 0439  BP: (!) 151/72  (!) 143/68 (!) 142/69  Pulse: (!) 107  89 90  Resp:   16 16  Temp: (!) 101.7 F (38.7 C) (!) 102.2 F (39 C) 98.4 F (36.9 C) 98.6 F (37 C)  TempSrc: Oral Oral Oral Oral  SpO2: 92%  97% 98%  Weight:      Height:        Wt Readings from Last 3 Encounters:  02/09/18 58.7 kg  01/01/18 59.2 kg  11/15/15 59 kg     Intake/Output Summary (Last 24 hours) at 02/16/2018 1310 Last data filed at 02/16/2018 0700 Gross per 24 hour  Intake 830.09 ml  Output 500 ml  Net 330.09 ml    Physical Exam Patient is examined daily including today on 02/16/18 , exams remain the same as of yesterday except  that has changed   Gen:- Awake Alert,  In no apparent distress  HEENT:- Reform.AT, No sclera icterus Neck-Supple Neck,No JVD,.  Lungs-diminished in bases left more than right, no wheezing CV- S1, S2 normal, regular Abd-  +ve B.Sounds, Abd Soft, not distended, no significant abdominal tenderness Extremity/Skin:- No  edema,   good pulses, polyarthralgia without obvious/significant inflammatory changes over the joints Psych-affect is appropriate, oriented x3 Neuro-no new focal deficits, no tremors   Data Review:   Micro Results Recent Results (from the past 240 hour(s))  Blood Culture (routine x 2)     Status: None   Collection Time: 02/09/18 11:47 AM  Result Value Ref Range Status   Specimen Description BLOOD RIGHT ANTECUBITAL  Final   Special Requests   Final    BOTTLES DRAWN AEROBIC AND ANAEROBIC Blood Culture results may not be optimal due to an inadequate volume of blood received in culture bottles   Culture   Final    NO GROWTH 5 DAYS Performed at [facility], [place] [address], [place], [place] [address]    Report Status 02/14/2018 FINAL  Final  Blood Culture (routine x 2)     Status: None   Collection Time: 02/09/18 11:57 AM  Result  Value Ref Range Status   Specimen Description BLOOD SITE NOT SPECIFIED  Final   Special Requests   Final    BOTTLES DRAWN AEROBIC AND ANAEROBIC Blood Culture results may not be optimal due to an inadequate volume of blood received in culture bottles   Culture   Final    NO GROWTH 5 DAYS Performed at [facility], [place] [address], [place], [place] [address]    Report Status 02/14/2018 FINAL  Final  Urine culture     Status: None   Collection Time: 02/09/18  3:26 PM  Result Value Ref Range Status   Specimen Description URINE, CLEAN CATCH  Final   Special Requests NONE  Final   Culture   Final    NO GROWTH Performed at [facility], [place] [address], [place], [place] [address]    Report Status 02/10/2018 FINAL  Final  Culture, blood (Routine X 2) w Reflex to ID Panel     Status: None (Preliminary result)   Collection Time: 02/14/18  4:22 PM  Result Value Ref Range Status   Specimen Description BLOOD LEFT HAND  Final   Special Requests   Final    BOTTLES DRAWN AEROBIC AND ANAEROBIC Blood Culture adequate volume   Culture   Final    NO GROWTH 2 DAYS Performed at [facility], [place] [address], [place], [place] [address]    Report Status PENDING  Incomplete    Radiology Reports Dg Chest 2 View  Result Date: 02/09/2018 CLINICAL DATA:  Severe back pain with fever 3 days. EXAM: CHEST - 2 VIEW COMPARISON:  01/08/2018 and CT 01/09/2018 FINDINGS: Lungs are adequately inflated without focal airspace consolidation or effusion. Cardiomediastinal silhouette is within normal. There is degenerative change of the spine with a stable mild compression deformity over the mid to lower thoracic spine. Moderate degenerative change of the shoulders. IMPRESSION: No acute cardiopulmonary disease. Stable mild compression fracture over the mid to lower thoracic spine. Electronically Signed   By: Marin Olp M.D.   On: 02/09/2018 12:59   Dg Abd Acute W/chest  Result Date: 02/15/2018 CLINICAL  DATA:  Abdominal pain. EXAM: DG ABDOMEN ACUTE W/ 1V CHEST COMPARISON:  Radiographs dated 02/11/2018 and 02/09/2018  and CT scan abdomen dated 02/09/2018 FINDINGS: There is a new focal area of infiltrate and atelectasis at the left lung base medially. Heart size and vascularity are normal. Bowel gas pattern is normal. No visible free air or free fluid in the abdomen. Chronic lucency around the right acetabular component of the total hip prosthesis. Left total hip prosthesis appears unchanged. IMPRESSION: New focal area of infiltrate/atelectasis at the left lung base posterior medially. No other acute abnormalities. Electronically Signed   By: Lorriane Shire M.D.   On: 02/15/2018 15:00   Dg Abd Portable 2v  Result Date: 02/11/2018 CLINICAL DATA:  Severe constipation EXAM: PORTABLE ABDOMEN - 2 VIEW COMPARISON:  CT 02/09/2018, pelvis radiograph 11/01/2015 FINDINGS: Patchy atelectasis at the left base with possible tiny left effusion. Development of dilated small bowel up to 3.2 cm with increased gaseous enlargement of right lower quadrant small and large bowel. Some distal colon gas is present. There are bilateral hip replacements. No definite free air beneath the diaphragm. IMPRESSION: Interim development of dilated small and large bowel since previous CT with prominent gas-filled loops of bowel in the right lower quadrant. Findings could be secondary to an ileus, however partial bowel obstruction is also a concern. Electronically Signed   By: Donavan Foil M.D.   On: 02/11/2018 17:19   Ct Renal Stone Study  Result Date: 02/09/2018 CLINICAL DATA:  Left flank pain and hematuria beginning 2 days ago. Stone disease suspected. EXAM: CT ABDOMEN AND PELVIS WITHOUT CONTRAST TECHNIQUE: Multidetector CT imaging of the abdomen and pelvis was performed following the standard protocol without IV contrast. COMPARISON:  CT of the abdomen and pelvis 08/11/2015 FINDINGS: Lower chest: Mild dependent atelectasis is present in  the lower lobes bilaterally. No significant effusion is present. Coronary artery calcifications are present. Heart size is normal. Blood pool is hypodense consistent with borderline anemia. Hepatobiliary: 2 subcentimeter lesions in the left lobe of the liver are stable. Likely represent small cysts. Patient is status post cholecystectomy. Common bile duct is within normal limits. Pancreas: Unremarkable. No pancreatic ductal dilatation or surrounding inflammatory changes. Spleen: Normal in size without focal abnormality. Adrenals/Urinary Tract: The adrenal glands are within normal limits bilaterally. Kidneys are unremarkable. There is no stone or mass lesion. No hydronephrosis is present. The ureters are within normal limits bilaterally. Distal ureters are obscured by streak artifact from bilateral hip prostheses. Stomach/Bowel: A small hiatal hernia is present. The stomach and duodenum are otherwise within normal limits. Small bowel is unremarkable. Terminal ileum is within normal limits. The appendix is visualized and normal. Moderate stool is present in the cecum and ascending colon. Transverse colon is within normal limits. Diverticular changes are present in the distal descending and sigmoid colon. Although the area is somewhat obscured by streak artifact, there appears to be some stranding about the sigmoid colon. No definite free air or abscess formation is present. Vascular/Lymphatic: Atherosclerotic calcifications are present in the aorta and branch vessels without aneurysm. No significant adenopathy is present. No significant adenopathy is present. Reproductive: Prostate is unremarkable. Other: No abdominal wall hernia or abnormality. No abdominopelvic ascites. Musculoskeletal: Bilateral total hip arthroplasty is present. Degenerative changes are noted in the lumbar spine with levoconvex curvature centered at L4-5. Facet degenerative changes are greatest at L4-5 and L5-S1. No focal lytic or blastic lesions  are present. IMPRESSION: 1. Diffuse descending and sigmoid diverticulosis with probable inflammatory changes about the sigmoid colon suggesting diverticulitis. The area is somewhat obscured by streak artifact from bilateral hip prostheses. No  complicating features are evident. 2. No nephrolithiasis or hydronephrosis. 3.  Aortic Atherosclerosis (ICD10-I70.0). 4. Coronary artery disease. 5. Mild dependent atelectasis at the lung bases bilaterally. Electronically Signed   By: San Morelle M.D.   On: 02/09/2018 16:19     CBC Recent Labs  Lab 02/12/18 0401 02/13/18 0229 02/14/18 0317 02/15/18 0505 02/16/18 0416  WBC 12.4* 8.2 9.2 11.4* 10.4  HGB 12.5* 10.9* 10.8* 12.2* 11.3*  HCT 37.3* 34.0* 33.4* 36.1* 33.6*  PLT 444* 373 400 450* 428*  MCV 86.1 88.1 86.8 84.9 85.5  MCH 28.9 28.2 28.1 28.7 28.8  MCHC 33.5 32.1 32.3 33.8 33.6  RDW 12.3 12.5 12.3 12.7 12.7  LYMPHSABS 1.9 1.5 1.7 1.7 1.9  MONOABS 1.0 0.6 0.8 0.9 0.8  EOSABS 0.0 0.1 0.2 0.2 0.3  BASOSABS 0.0 0.0 0.0 0.0 0.0    Chemistries  Recent Labs  Lab 02/09/18 1915  02/11/18 0253 02/12/18 0401 02/13/18 0229 02/14/18 0317 02/15/18 0505  NA  --    < > 136 138 139 137 138  K  --    < > 2.7* 2.9* 3.4* 2.7* 3.2*  CL  --    < > 108 108 115* 111 106  CO2  --    < > 21* 21* 17* 19* 23  GLUCOSE  --    < > 132* 88 72 85 87  BUN  --    < > _0 7* <5*  CREATININE  --    < > 0.47* 0.54* 0.56* 0.46* 0.50*  CALCIUM  --    < > 7.8* 7.7* 7.3* 7.1* 7.6*  MG  --   --   --   --  2.0 1.7  --   AST 16  --   --   --   --   --   --   ALT 11  --   --   --   --   --   --   ALKPHOS 64  --   --   --   --   --   --   BILITOT 1.7*  --   --   --   --   --   --    < > = values in this interval not displayed.   ------------------------------------------------------------------------------------------------------------------ No results for input(s): CHOL, HDL, LDLCALC, TRIG, CHOLHDL, LDLDIRECT in the last 72 hours.  No results found for:  HGBA1C ------------------------------------------------------------------------------------------------------------------ No results for input(s): TSH, T4TOTAL, T3FREE, THYROIDAB in the last 72 hours.  Invalid input(s): FREET3 ------------------------------------------------------------------------------------------------------------------ No results for input(s): VITAMINB12, FOLATE, FERRITIN, TIBC, IRON, RETICCTPCT in the last 72 hours.  Coagulation profile No results for input(s): INR, PROTIME in the last 168 hours.  No results for input(s): DDIMER in the last 72 hours.  Cardiac Enzymes No results for input(s): CKMB, TROPONINI, MYOGLOBIN in the last 168 hours.  Invalid input(s): CK ------------------------------------------------------------------------------------------------------------------ No results found for: BNP   Roxan Hockey M.D on 02/16/2018 at 1:10 PM  Pager---[phone] Go to [email or web address] - password [email or web address] for contact info  [facility] - Office  [phone]

## 2018-02-17 LAB — BASIC METABOLIC PANEL
Anion gap: 7 (ref 5–15)
BUN: 5 mg/dL — AB (ref 8–23)
CO2: 25 mmol/L (ref 22–32)
Calcium: 7.2 mg/dL — ABNORMAL LOW (ref 8.9–10.3)
Chloride: 104 mmol/L (ref 98–111)
Creatinine, Ser: 0.6 mg/dL — ABNORMAL LOW (ref 0.61–1.24)
GFR calc Af Amer: >60 mL/min (ref >60)
GLUCOSE: 109 mg/dL — AB (ref 70–99)
Potassium: 2.8 mmol/L — ABNORMAL LOW (ref 3.5–5.1)
Sodium: 136 mmol/L (ref 135–145)

## 2018-02-17 MED ORDER — POTASSIUM CHLORIDE CRYS ER 20 MEQ PO TBCR
40.0000 meq | EXTENDED_RELEASE_TABLET | Freq: Once | ORAL | Status: AC
  Administered 2018-02-17: 40 meq via ORAL
  Filled 2018-02-17: qty 2

## 2018-02-17 NOTE — Progress Notes (Signed)
Patient Demographics:    John Lyons, is a 71 y.o. male, DOB - October 24, 1945, GGY:[id number]  Admit date - 02/09/2018   Admitting Physician [name], MD  Outpatient Primary MD for the patient is [name], MD  LOS - 8   Chief Complaint  Patient presents with  . Flank Pain        Subjective:    John Lyons  had fevers with a T-max of 101.4 on 02/16/2018, currently afebrile, no emesis  Assessment  & Plan :    Principal Problem:   Acute diverticulitis Active Problems:   Essential hypertension   Addison's disease ([place])   Rheumatoid arthritis ([place])   GERD (gastroesophageal reflux disease)  Brief summary 72 y.o. male with medical history significant for Addison's disease, hypertension, hyperlipidemia, rheumatoid arthritis, diverticulosis admitted on 02/09/2018 with abdominal pain and fevers and clinical and imaging findings consistent with diverticulitis. Patient met sepsis criteria with fevers, leukocytosis and lactic acidosis.  Urine and Blood culture negative   Plan:- 1)Sepsis 2/2 acute Diverticulitis-   T-max 101.4, T current 98.4, continue IV Zosyn restarted on 02/15/2018,    blood cultures from 02/14/2018 neg so far , plan to to discharge to SNF  on p.o. Augmentin, continue probiotic, use PRN pain medications and antiemetics, advance diet further to Gi soft  2)Possible partial bowel obstruction Vs ileus-  resolved emesis, diarrhea has resolved, advance diet further to Gi soft , surgical input appreciated,continue to hold Linzess, repeat abdominal x-rays on 02/15/18 without acute abdominal findings  3)FEN/Hypokalemia---- replace and recheck, tolerating full liquid diet well persistent/recurrent hypokalemia may be related to Florinef use, potassium is 2.8  4)Addison's disease- , patient received stress dose of Solu-Cortef, continue Florinef 0.1 mg daily, and hydrocortisone 20  mg daily ,,   5)Hypertension- stable, continue amlodipine 5 mg daily, continue to hold Telmisartan for now,  may use IV Hydralazine 10 mg  Every 4 hours Prn for systolic blood pressure over 160 mmhg  6)Dysrhythmia-stable, asymptomatic, continue digoxin  7)Rheumatoid arthritis-  Continue Plaquenil andfolic acid, complains of arthralgia, already on hydrocortisone  8)Mobility related challenges-----patient has generalized weakness, fatigue, has doing difficulties with mobility related activities of daily living, patient has rheumatoid arthritis complains of arthralgia and stiffness, PT/OT recommends SNF rehab  10)Possible Left-sided Pneumonia (LLL)--- fever noted,  c/n  azithromycin to Zosyn, mucinex, albuterol prn   Code Status: DNR   Family Communication:  Previously discussed with patient's brother and patient's sister  Disposition Plan:  SNF   Consultants: General surgery/dietitian/pharmacy  Procedures:  None   Antimicrobials:  Cefepime- stopped 02/13/18  Flagyl -stopped 02/13/18  Zosyn started 02/13/18, restarted 02/15/18  Disposition/Need for in-Hospital Stay- patient unable to be discharged at this time due to persistent fevers and  need for SNF Rehab   DVT Prophylaxis  :  Lovenox  Lab Results  Component Value Date   PLT 428 (H) 02/16/2018    Inpatient Medications  Scheduled Meds: . ALPRAZolam  1 mg Oral QHS  . amLODipine  10 mg Oral Daily  . dextromethorphan-guaiFENesin  1 tablet Oral BID  . digoxin  0.0625 mg Oral Daily  . enoxaparin (LOVENOX) injection  40 mg Subcutaneous Q24H  . fludrocortisone  0.1 mg Oral Daily  . fluticasone  1 spray Each Nare Daily  . folic acid  765 mcg Oral Daily  . hydrocortisone  20 mg Oral Daily  . hydroxychloroquine  200 mg Oral Daily  . pantoprazole  40 mg Oral Daily  . polyethylene glycol  17 g Oral BID  . potassium chloride  40 mEq Oral Once  . potassium chloride  40 mEq Oral Once  . saccharomyces boulardii  250  mg Oral BID  . senna-docusate  1 tablet Oral BID  . sodium chloride flush  3 mL Intravenous Q12H   Continuous Infusions: . sodium chloride 30 mL/hr at 02/17/18 0600  . sodium chloride 250 mL (02/15/18 1745)  . azithromycin Stopped (02/16/18 1951)  . piperacillin-tazobactam (ZOSYN)  IV 3.375 g (02/17/18 0557)   PRN Meds:.sodium chloride, acetaminophen **OR** acetaminophen, albuterol, hydrALAZINE, morphine injection, ondansetron **OR** ondansetron (ZOFRAN) IV, oxyCODONE, polyvinyl alcohol, sodium chloride flush    Anti-infectives (From admission, onward)   Start     Dose/Rate Route Frequency Ordered Stop   02/15/18 1630  piperacillin-tazobactam (ZOSYN) IVPB 3.375 g  Status:  Discontinued     3.375 g 100 mL/hr over 30 Minutes Intravenous  Once 02/15/18 1615 02/15/18 1627   02/15/18 1630  piperacillin-tazobactam (ZOSYN) IVPB 3.375 g     3.375 g 12.5 mL/hr over 240 Minutes Intravenous Every 8 hours 02/15/18 1627     02/15/18 1615  azithromycin (ZITHROMAX) 500 mg in sodium chloride 0.9 % 250 mL IVPB     500 mg 250 mL/hr over 60 Minutes Intravenous Every 24 hours 02/15/18 1614     02/10/18 0000  metroNIDAZOLE (FLAGYL) IVPB 500 mg  Status:  Discontinued     500 mg 100 mL/hr over 60 Minutes Intravenous Every 8 hours 02/09/18 1737 02/13/18 0813   02/09/18 2200  ceFEPIme (MAXIPIME) 2 g in sodium chloride 0.9 % 100 mL IVPB  Status:  Discontinued     2 g 200 mL/hr over 30 Minutes Intravenous Every 12 hours 02/09/18 1811 02/13/18 0813   02/09/18 1745  hydroxychloroquine (PLAQUENIL) tablet 200 mg     200 mg Oral Daily 02/09/18 1744     02/09/18 1645  metroNIDAZOLE (FLAGYL) IVPB 500 mg     500 mg 100 mL/hr over 60 Minutes Intravenous  Once 02/09/18 1641 02/09/18 1827   02/09/18 1115  cefTRIAXone (ROCEPHIN) 1 g in sodium chloride 0.9 % 100 mL IVPB  Status:  Discontinued     1 g 200 mL/hr over 30 Minutes Intravenous Every 24 hours 02/09/18 1112 02/09/18 1811        Objective:   Vitals:    02/16/18 0439 02/16/18 1318 02/16/18 2203 02/17/18 0624  BP: (!) 142/69 (!) 124/58 (!) 133/51 (!) 123/56  Pulse: 90 98 93 92  Resp: '16  16 16  '$ Temp: 98.6 F (37 C) 99.9 F (37.7 C) (!) 101.4 F (38.6 C) 98.4 F (36.9 C)  TempSrc: Oral Oral Oral Oral  SpO2: 98% 92% 95% 95%  Weight:      Height:        Wt Readings from Last 3 Encounters:  02/09/18 58.7 kg  01/01/18 59.2 kg  11/15/15 59 kg     Intake/Output Summary (Last 24 hours) at 02/17/2018 1346 Last data filed at 02/17/2018 0930 Gross per 24 hour  Intake 1558.56 ml  Output 900 ml  Net 658.56 ml    Physical Exam Patient is examined daily including today on 02/17/18 , exams remain the same as of yesterday except that has changed  Gen:- Awake Alert,  In no apparent distress  HEENT:- [place].AT, No sclera icterus Neck-Supple Neck,No JVD,.  Lungs-diminished in bases left more than right, no wheezing CV- S1, S2 normal, regular Abd-  +ve B.Sounds, Abd Soft, not distended, no significant abdominal tenderness Extremity/Skin:- No  edema,   good pulses, polyarthralgia without obvious/significant inflammatory changes over the joints Psych-affect is appropriate, oriented x3 Neuro-no new focal deficits, no tremors   Data Review:   Micro Results Recent Results (from the past 240 hour(s))  Blood Culture (routine x 2)     Status: None   Collection Time: 02/09/18 11:47 AM  Result Value Ref Range Status   Specimen Description BLOOD RIGHT ANTECUBITAL  Final   Special Requests   Final    BOTTLES DRAWN AEROBIC AND ANAEROBIC Blood Culture results may not be optimal due to an inadequate volume of blood received in culture bottles   Culture   Final    NO GROWTH 5 DAYS Performed at [facility], [place] [address], [place], [place] [address]    Report Status 02/14/2018 FINAL  Final  Blood Culture (routine x 2)     Status: None   Collection Time: 02/09/18 11:57 AM  Result Value Ref Range Status   Specimen Description BLOOD SITE NOT  SPECIFIED  Final   Special Requests   Final    BOTTLES DRAWN AEROBIC AND ANAEROBIC Blood Culture results may not be optimal due to an inadequate volume of blood received in culture bottles   Culture   Final    NO GROWTH 5 DAYS Performed at [facility], [place] [address] [place], [place] [address]    Report Status 02/14/2018 FINAL  Final  Urine culture     Status: None   Collection Time: 02/09/18  3:26 PM  Result Value Ref Range Status   Specimen Description URINE, CLEAN CATCH  Final   Special Requests NONE  Final   Culture   Final    NO GROWTH Performed at [facility], [place] [address], [place], [place] [address]    Report Status 02/10/2018 FINAL  Final  Culture, blood (Routine X 2) w Reflex to ID Panel     Status: None (Preliminary result)   Collection Time: 02/14/18  4:22 PM  Result Value Ref Range Status   Specimen Description BLOOD LEFT HAND  Final   Special Requests   Final    BOTTLES DRAWN AEROBIC AND ANAEROBIC Blood Culture adequate volume   Culture   Final    NO GROWTH 2 DAYS Performed at [facility], [place] [address] [place],  [address]    Report Status PENDING  Incomplete    Radiology Reports Dg Chest 2 View  Result Date: 02/09/2018 CLINICAL DATA:  Severe back pain with fever 3 days. EXAM: CHEST - 2 VIEW COMPARISON:  01/08/2018 and CT 01/09/2018 FINDINGS: Lungs are adequately inflated without focal airspace consolidation or effusion. Cardiomediastinal silhouette is within normal. There is degenerative change of the spine with a stable mild compression deformity over the mid to lower thoracic spine. Moderate degenerative change of the shoulders. IMPRESSION: No acute cardiopulmonary disease. Stable mild compression fracture over the mid to lower thoracic spine. Electronically Signed   By: Marin Olp M.D.   On: 02/09/2018 12:59   Dg Abd Acute W/chest  Result Date: 02/15/2018 CLINICAL DATA:  Abdominal pain. EXAM: DG ABDOMEN ACUTE W/ 1V CHEST  COMPARISON:  Radiographs dated 02/11/2018 and 02/09/2018 and CT scan abdomen dated  02/09/2018 FINDINGS: There is a new focal area of infiltrate and atelectasis at the left lung base medially. Heart size and vascularity are normal. Bowel gas pattern is normal. No visible free air or free fluid in the abdomen. Chronic lucency around the right acetabular component of the total hip prosthesis. Left total hip prosthesis appears unchanged. IMPRESSION: New focal area of infiltrate/atelectasis at the left lung base posterior medially. No other acute abnormalities. Electronically Signed   By: Lorriane Shire M.D.   On: 02/15/2018 15:00   Dg Abd Portable 2v  Result Date: 02/11/2018 CLINICAL DATA:  Severe constipation EXAM: PORTABLE ABDOMEN - 2 VIEW COMPARISON:  CT 02/09/2018, pelvis radiograph 11/01/2015 FINDINGS: Patchy atelectasis at the left base with possible tiny left effusion. Development of dilated small bowel up to 3.2 cm with increased gaseous enlargement of right lower quadrant small and large bowel. Some distal colon gas is present. There are bilateral hip replacements. No definite free air beneath the diaphragm. IMPRESSION: Interim development of dilated small and large bowel since previous CT with prominent gas-filled loops of bowel in the right lower quadrant. Findings could be secondary to an ileus, however partial bowel obstruction is also a concern. Electronically Signed   By: Donavan Foil M.D.   On: 02/11/2018 17:19   Ct Renal Stone Study  Result Date: 02/09/2018 CLINICAL DATA:  Left flank pain and hematuria beginning 2 days ago. Stone disease suspected. EXAM: CT ABDOMEN AND PELVIS WITHOUT CONTRAST TECHNIQUE: Multidetector CT imaging of the abdomen and pelvis was performed following the standard protocol without IV contrast. COMPARISON:  CT of the abdomen and pelvis 08/11/2015 FINDINGS: Lower chest: Mild dependent atelectasis is present in the lower lobes bilaterally. No significant effusion is  present. Coronary artery calcifications are present. Heart size is normal. Blood pool is hypodense consistent with borderline anemia. Hepatobiliary: 2 subcentimeter lesions in the left lobe of the liver are stable. Likely represent small cysts. Patient is status post cholecystectomy. Common bile duct is within normal limits. Pancreas: Unremarkable. No pancreatic ductal dilatation or surrounding inflammatory changes. Spleen: Normal in size without focal abnormality. Adrenals/Urinary Tract: The adrenal glands are within normal limits bilaterally. Kidneys are unremarkable. There is no stone or mass lesion. No hydronephrosis is present. The ureters are within normal limits bilaterally. Distal ureters are obscured by streak artifact from bilateral hip prostheses. Stomach/Bowel: A small hiatal hernia is present. The stomach and duodenum are otherwise within normal limits. Small bowel is unremarkable. Terminal ileum is within normal limits. The appendix is visualized and normal. Moderate stool is present in the cecum and ascending colon. Transverse colon is within normal limits. Diverticular changes are present in the distal descending and sigmoid colon. Although the area is somewhat obscured by streak artifact, there appears to be some stranding about the sigmoid colon. No definite free air or abscess formation is present. Vascular/Lymphatic: Atherosclerotic calcifications are present in the aorta and branch vessels without aneurysm. No significant adenopathy is present. No significant adenopathy is present. Reproductive: Prostate is unremarkable. Other: No abdominal wall hernia or abnormality. No abdominopelvic ascites. Musculoskeletal: Bilateral total hip arthroplasty is present. Degenerative changes are noted in the lumbar spine with levoconvex curvature centered at L4-5. Facet degenerative changes are greatest at L4-5 and L5-S1. No focal lytic or blastic lesions are present. IMPRESSION: 1. Diffuse descending and  sigmoid diverticulosis with probable inflammatory changes about the sigmoid colon suggesting diverticulitis. The area is somewhat obscured by streak artifact from bilateral hip prostheses. No complicating features are evident. 2.  No nephrolithiasis or hydronephrosis. 3.  Aortic Atherosclerosis (ICD10-I70.0). 4. Coronary artery disease. 5. Mild dependent atelectasis at the lung bases bilaterally. Electronically Signed   By: San Morelle M.D.   On: 02/09/2018 16:19     CBC Recent Labs  Lab 02/12/18 0401 02/13/18 0229 02/14/18 0317 02/15/18 0505 02/16/18 0416  WBC 12.4* 8.2 9.2 11.4* 10.4  HGB 12.5* 10.9* 10.8* 12.2* 11.3*  HCT 37.3* 34.0* 33.4* 36.1* 33.6*  PLT 444* 373 400 450* 428*  MCV 86.1 88.1 86.8 84.9 85.5  MCH 28.9 28.2 28.1 28.7 28.8  MCHC 33.5 32.1 32.3 33.8 33.6  RDW 12.3 12.5 12.3 12.7 12.7  LYMPHSABS 1.9 1.5 1.7 1.7 1.9  MONOABS 1.0 0.6 0.8 0.9 0.8  EOSABS 0.0 0.1 0.2 0.2 0.3  BASOSABS 0.0 0.0 0.0 0.0 0.0    Chemistries  Recent Labs  Lab 02/12/18 0401 02/13/18 0229 02/14/18 0317 02/15/18 0505 02/17/18 0311  NA 138 139 137 138 136  K 2.9* 3.4* 2.7* 3.2* 2.8*  CL 108 115* 111 106 104  CO2 21* 17* 19* 23 25  GLUCOSE 88 72 85 87 109*  BUN 11 16 7* <5* 5*  CREATININE 0.54* 0.56* 0.46* 0.50* 0.60*  CALCIUM 7.7* 7.3* 7.1* 7.6* 7.2*  MG  --  2.0 1.7  --   --    ------------------------------------------------------------------------------------------------------------------ No results for input(s): CHOL, HDL, LDLCALC, TRIG, CHOLHDL, LDLDIRECT in the last 72 hours.  No results found for: HGBA1C ------------------------------------------------------------------------------------------------------------------ No results for input(s): TSH, T4TOTAL, T3FREE, THYROIDAB in the last 72 hours.  Invalid input(s): FREET3 ------------------------------------------------------------------------------------------------------------------ No results for input(s):  VITAMINB12, FOLATE, FERRITIN, TIBC, IRON, RETICCTPCT in the last 72 hours.  Coagulation profile No results for input(s): INR, PROTIME in the last 168 hours.  No results for input(s): DDIMER in the last 72 hours.  Cardiac Enzymes No results for input(s): CKMB, TROPONINI, MYOGLOBIN in the last 168 hours.  Invalid input(s): CK ------------------------------------------------------------------------------------------------------------------ No results found for: BNP   Roxan Hockey M.D on 02/17/2018 at 1:46 PM  Pager---[phone] Go to [email or web address] - password [email or web address] for contact info  [facility] - Office  [phone]

## 2018-02-18 DIAGNOSIS — M25531 Pain in right wrist: Secondary | ICD-10-CM | POA: Diagnosis not present

## 2018-02-18 DIAGNOSIS — K219 Gastro-esophageal reflux disease without esophagitis: Secondary | ICD-10-CM | POA: Diagnosis not present

## 2018-02-18 DIAGNOSIS — F419 Anxiety disorder, unspecified: Secondary | ICD-10-CM | POA: Diagnosis not present

## 2018-02-18 DIAGNOSIS — M069 Rheumatoid arthritis, unspecified: Secondary | ICD-10-CM | POA: Diagnosis not present

## 2018-02-18 DIAGNOSIS — K5900 Constipation, unspecified: Secondary | ICD-10-CM | POA: Diagnosis not present

## 2018-02-18 DIAGNOSIS — Z96642 Presence of left artificial hip joint: Secondary | ICD-10-CM | POA: Diagnosis not present

## 2018-02-18 DIAGNOSIS — H04129 Dry eye syndrome of unspecified lacrimal gland: Secondary | ICD-10-CM | POA: Diagnosis not present

## 2018-02-18 DIAGNOSIS — E785 Hyperlipidemia, unspecified: Secondary | ICD-10-CM | POA: Diagnosis not present

## 2018-02-18 DIAGNOSIS — Z96653 Presence of artificial knee joint, bilateral: Secondary | ICD-10-CM | POA: Diagnosis not present

## 2018-02-18 DIAGNOSIS — I10 Essential (primary) hypertension: Secondary | ICD-10-CM | POA: Diagnosis not present

## 2018-02-18 DIAGNOSIS — K5792 Diverticulitis of intestine, part unspecified, without perforation or abscess without bleeding: Secondary | ICD-10-CM | POA: Diagnosis not present

## 2018-02-18 DIAGNOSIS — A419 Sepsis, unspecified organism: Secondary | ICD-10-CM | POA: Diagnosis not present

## 2018-02-18 DIAGNOSIS — R2681 Unsteadiness on feet: Secondary | ICD-10-CM | POA: Diagnosis not present

## 2018-02-18 DIAGNOSIS — E876 Hypokalemia: Secondary | ICD-10-CM | POA: Diagnosis not present

## 2018-02-18 DIAGNOSIS — I499 Cardiac arrhythmia, unspecified: Secondary | ICD-10-CM | POA: Diagnosis not present

## 2018-02-18 DIAGNOSIS — E271 Primary adrenocortical insufficiency: Secondary | ICD-10-CM | POA: Diagnosis not present

## 2018-02-18 DIAGNOSIS — M6281 Muscle weakness (generalized): Secondary | ICD-10-CM | POA: Diagnosis not present

## 2018-02-18 LAB — BASIC METABOLIC PANEL
Anion gap: 8 (ref 5–15)
BUN: 5 mg/dL — ABNORMAL LOW (ref 8–23)
CALCIUM: 7.9 mg/dL — AB (ref 8.9–10.3)
CO2: 25 mmol/L (ref 22–32)
CREATININE: 0.58 mg/dL — AB (ref 0.61–1.24)
Chloride: 104 mmol/L (ref 98–111)
GFR calc non Af Amer: >60 mL/min (ref >60)
Glucose, Bld: 110 mg/dL — ABNORMAL HIGH (ref 70–99)
Potassium: 3.3 mmol/L — ABNORMAL LOW (ref 3.5–5.1)
SODIUM: 137 mmol/L (ref 135–145)

## 2018-02-18 MED ORDER — AZITHROMYCIN 250 MG PO TABS
500.0000 mg | ORAL_TABLET | Freq: Once | ORAL | Status: AC
  Administered 2018-02-18: 500 mg via ORAL
  Filled 2018-02-18: qty 2

## 2018-02-18 MED ORDER — LACTINEX PO CHEW: 1.0000 | CHEWABLE_TABLET | Freq: Three times a day (TID) | ORAL | 0 refills | Status: AC

## 2018-02-18 MED ORDER — OXYCODONE-ACETAMINOPHEN 5-325 MG PO TABS: 1.0000 | ORAL_TABLET | ORAL | 0 refills | Status: DC | PRN

## 2018-02-18 MED ORDER — ONDANSETRON HCL 4 MG/2ML IJ SOLN
4.0000 mg | Freq: Once | INTRAMUSCULAR | Status: AC
  Administered 2018-02-18: 4 mg via INTRAVENOUS
  Filled 2018-02-18: qty 2

## 2018-02-18 MED ORDER — ALPRAZOLAM 0.5 MG PO TABS: 0.5000 mg | ORAL_TABLET | Freq: Every evening | ORAL | 0 refills | Status: DC | PRN

## 2018-02-18 MED ORDER — PIPERACILLIN-TAZOBACTAM 3.375 G IVPB 30 MIN
3.3750 g | Freq: Once | INTRAVENOUS | Status: AC
  Administered 2018-02-18: 3.375 g via INTRAVENOUS
  Filled 2018-02-18 (×2): qty 50

## 2018-02-18 MED ORDER — AMOXICILLIN-POT CLAVULANATE 875-125 MG PO TABS: 1.0000 | ORAL_TABLET | Freq: Two times a day (BID) | ORAL | 0 refills | Status: AC

## 2018-02-18 MED ORDER — AMLODIPINE BESYLATE 10 MG PO TABS: 10.0000 mg | ORAL_TABLET | Freq: Every day | ORAL | 2 refills | Status: DC

## 2018-02-18 MED ORDER — POTASSIUM CHLORIDE CRYS ER 20 MEQ PO TBCR
40.0000 meq | EXTENDED_RELEASE_TABLET | Freq: Once | ORAL | Status: AC
  Administered 2018-02-18: 40 meq via ORAL
  Filled 2018-02-18: qty 2

## 2018-02-18 MED ORDER — ONDANSETRON HCL 4 MG PO TABS: 4.0000 mg | ORAL_TABLET | Freq: Four times a day (QID) | ORAL | 0 refills | Status: DC | PRN

## 2018-02-18 MED ORDER — POTASSIUM CHLORIDE ER 20 MEQ PO TBCR: 20.0000 meq | EXTENDED_RELEASE_TABLET | Freq: Every day | ORAL | 1 refills | Status: AC

## 2018-02-18 NOTE — Social Work (Signed)
CSW spoke with attending MD, pt stable for discharge. Pt family will transport him to [facility]. Await d/c summary.  Westley Hummer, MSW, [employer] [facility] [phone]

## 2018-02-18 NOTE — Discharge Instructions (Signed)
1)Take Augmentin antibiotic and probiotic/Lactobacillus acidophilus as prescribed for acute diverticulitis/infection of her bowel 2) recheck CBC/complete blood count and BMP/kidney and electrolyte test on Friday 02/22/18    Low-Fiber Diet (for 1-2 weeks after your hospitalization) Fiber is found in fruits, vegetables, and whole grains. A low-fiber diet restricts fibrous foods that are not digested in the small intestine. A diet containing about 10-15 grams of fiber per day is considered low fiber. Low-fiber diets may be used to:  Promote healing and rest the bowel during intestinal flare-ups.  Prevent blockage of a partially obstructed or narrowed gastrointestinal tract.  Reduce fecal weight and volume.  Slow the movement of feces.  You may be on a low-fiber diet as a transitional diet following surgery, after an injury (trauma), or because of a short (acute) or lifelong (chronic) illness. Your health care provider will determine the length of time you need to stay on this diet. What do I need to know about a low-fiber diet? Always check the fiber content on the packaging's Nutrition Facts label, especially on foods from the grains list. Ask your dietitian if you have questions about specific foods that are related to your condition, especially if the food is not listed below. In general, a low-fiber food will have less than 2 g of fiber. What foods can I eat? Grains All breads and crackers made with white flour. Sweet rolls, doughnuts, waffles, pancakes, [place] toast, bagels. Pretzels, Melba toast, zwieback. Well-cooked cereals, such as cornmeal, farina, or cream cereals. Dry cereals that do not contain whole grains, fruit, or nuts, such as refined corn, wheat, rice, and oat cereals. Potatoes prepared any way without skins, plain pastas and noodles, refined white rice. Use white flour for baking and making sauces. Use allowed list of grains for casseroles, dumplings, and  puddings. Vegetables Strained tomato and vegetable juices. Fresh lettuce, cucumber, spinach. Well-cooked (no skin or pulp) or canned vegetables, such as asparagus, bean sprouts, beets, carrots, green beans, mushrooms, potatoes, pumpkin, spinach, yellow squash, tomato sauce/puree, turnips, yams, and zucchini. Keep servings limited to  cup. Fruits All fruit juices except prune juice. Cooked or canned fruits without skin and seeds, such as applesauce, apricots, cherries, fruit cocktail, grapefruit, grapes, mandarin oranges, melons, peaches, pears, pineapple, and plums. Fresh fruits without skin, such as apricots, avocados, bananas, melons, pineapple, nectarines, and peaches. Keep servings limited to  cup or 1 piece. Meat and Other Protein Sources Ground or well-cooked tender beef, ham, veal, lamb, pork, or poultry. Eggs, plain cheese. Fish, oysters, shrimp, lobster, and other seafood. Liver, organ meats. Smooth nut butters. Dairy All milk products and alternative dairy substitutes, such as soy, rice, almond, and coconut, not containing added whole nuts, seeds, or added fruit. Beverages Decaf coffee, fruit, and vegetable juices or smoothies (small amounts, with no pulp or skins, and with fruits from allowed list), sports drinks, herbal tea. Condiments Ketchup, mustard, vinegar, cream sauce, cheese sauce, cocoa powder. Spices in moderation, such as allspice, basil, bay leaves, celery powder or leaves, cinnamon, cumin powder, curry powder, ginger, mace, marjoram, onion or garlic powder, oregano, paprika, parsley flakes, ground pepper, rosemary, sage, savory, tarragon, thyme, and turmeric. Sweets and Desserts Plain cakes and cookies, pie made with allowed fruit, pudding, custard, cream pie. Gelatin, fruit, ice, sherbet, frozen ice pops. Ice cream, ice milk without nuts. Plain hard candy, honey, jelly, molasses, syrup, sugar, chocolate syrup, gumdrops, marshmallows. Limit overall sugar intake. Fats and  Oil Margarine, butter, cream, mayonnaise, salad oils, plain salad dressings made  from allowed foods. Choose healthy fats such as olive oil, canola oil, and omega-3 fatty acids (such as found in salmon or tuna) when possible. Other Bouillon, broth, or cream soups made from allowed foods. Any strained soup. Casseroles or mixed dishes made with allowed foods. The items listed above may not be a complete list of recommended foods or beverages. Contact your dietitian for more options. What foods are not recommended? Grains All whole wheat and whole grain breads and crackers. Multigrains, rye, bran seeds, nuts, or coconut. Cereals containing whole grains, multigrains, bran, coconut, nuts, raisins. Cooked or dry oatmeal, steel-cut oats. Coarse wheat cereals, granola. Cereals advertised as high fiber. Potato skins. Whole grain pasta, wild or brown rice. Popcorn. Coconut flour. Bran, buckwheat, corn bread, multigrains, rye, wheat germ. Vegetables Fresh, cooked or canned vegetables, such as artichokes, asparagus, beet greens, broccoli, Brussels sprouts, cabbage, celery, cauliflower, corn, eggplant, kale, legumes or beans, okra, peas, and tomatoes. Avoid large servings of any vegetables, especially raw vegetables. Fruits Fresh fruits, such as apples with or without skin, berries, cherries, figs, grapes, grapefruit, guavas, kiwis, mangoes, oranges, papayas, pears, persimmons, pineapple, and pomegranate. Prune juice and juices with pulp, stewed or dried prunes. Dried fruits, dates, raisins. Fruit seeds or skins. Avoid large servings of all fresh fruits. Meats and Other Protein Sources Tough, fibrous meats with gristle. Chunky nut butter. Cheese made with seeds, nuts, or other foods not recommended. Nuts, seeds, legumes (beans, including baked beans), dried peas, beans, lentils. Dairy Yogurt or cheese that contains nuts, seeds, or added fruit. Beverages Fruit juices with high pulp, prune juice. Caffeinated coffee  and teas. Condiments Coconut, maple syrup, pickles, olives. Sweets and Desserts Desserts, cookies, or candies that contain nuts or coconut, chunky peanut butter, dried fruits. Jams, preserves with seeds, marmalade. Large amounts of sugar and sweets. Any other dessert made with fruits from the not recommended list. Other Soups made from vegetables that are not recommended or that contain other foods not recommended. The items listed above may not be a complete list of foods and beverages to avoid. Contact your dietitian for more information. This information is not intended to replace advice given to you by your health care provider. Make sure you discuss any questions you have with your health care provider. Document Released: 09/30/2001 Document Revised: 09/16/2015 Document Reviewed: 03/03/2013 Elsevier Interactive Patient Education  2017 [place].   1)Take Augmentin antibiotic and probiotic/Lactobacillus acidophilus as prescribed for acute diverticulitis/infection of her bowel 2) recheck CBC/complete blood count and BMP/kidney and electrolyte test on Friday 02/22/18

## 2018-02-18 NOTE — Progress Notes (Signed)
John Lyons to be D/C'd  per MD order. Discussed with the patient and all questions fully answered.  VSS, Skin clean, dry and intact without evidence of skin break down, no evidence of skin tears noted.  IV catheter discontinued intact. Site without signs and symptoms of complications. Dressing and pressure applied.  An After Visit Summary was printed and given to the patient. Patient received prescription.  D/c education completed with patient/family including follow up instructions, medication list, d/c activities limitations if indicated, with other d/c instructions as indicated by MD - patient able to verbalize understanding, all questions fully answered.   Patient instructed to return to ED, call 911, or call MD for any changes in condition.   Gave report to [place] at [facility]  Patient to be escorted via [facility], and D/C home via private auto.

## 2018-02-18 NOTE — Progress Notes (Signed)
Pharmacy Antibiotic Note  John Lyons is a 72 y.o. male admitted on 02/09/2018 with flank pain and concern for pyelo vs intra-abdominal infection. Pharmacy has been consulted for Zosyn dosing. SCr stable wnl.  Day #4 of broadened abx for intra-abd infx: diverticulitis. MD wanted broader coverage since immunocompromised. Afebrile, WBC wnl.  Plan: Continue Zosyn 3.375g IV q8h (4h infusion) Monitor clinical picture, renal function F/U C&S, abx deescalation / LOT  Transition to PO soon?  Height: 5\' 3"  (160 cm) Weight: 129 lb 6.6 oz (58.7 kg) IBW/kg (Calculated) : 56.9  Temp (24hrs), Avg:99.2 F (37.3 C), Min:98.7 F (37.1 C), Max:99.7 F (37.6 C)  Recent Labs  Lab 02/12/18 0401 02/13/18 0229 02/14/18 0317 02/15/18 0505 02/16/18 0416 02/17/18 0311  WBC 12.4* 8.2 9.2 11.4* 10.4  --   CREATININE 0.54* 0.56* 0.46* 0.50*  --  0.60*    Estimated Creatinine Clearance: 65.2 mL/min (A) (by C-G formula based on SCr of 0.6 mg/dL (L)).    Allergies  Allergen Reactions  . Adhesive [Tape] Rash  . Phenobarbital Rash   Elenor Quinones, PharmD, BCPS Clinical Pharmacist Phone number [phone] 02/18/2018 10:47 AM

## 2018-02-18 NOTE — Clinical Social Work Placement (Signed)
   CLINICAL SOCIAL WORK PLACEMENT  NOTE [facility] RN to call report to [phone]  Date:  02/18/2018  Patient Details  Name: John Lyons MRN: [id number] Date of Birth: 1946/03/01  Clinical Social Work is seeking post-discharge placement for this patient at the [place] level of care (*CSW will initial, date and re-position this form in  chart as items are completed):  Yes   Patient/family provided with [place] Work Department's list of facilities offering this level of care within the geographic area requested by the patient (or if unable, by the patient's family).  Yes   Patient/family informed of their freedom to choose among providers that offer the needed level of care, that participate in Medicare, Medicaid or managed care program needed by the patient, have an available bed and are willing to accept the patient.  Yes   Patient/family informed of [facility]'s ownership interest in [facility] and [facility], as well as of the fact that they are under no obligation to receive care at these facilities.  PASRR submitted to EDS on       PASRR number received on       Existing PASRR number confirmed on 02/14/18     FL2 transmitted to all facilities in geographic area requested by pt/family on 02/14/18     FL2 transmitted to all facilities within larger geographic area on       Patient informed that his/her managed care company has contracts with or will negotiate with certain facilities, including the following:        Yes   Patient/family informed of bed offers received.  Patient chooses bed at [facility]     Physician recommends and patient chooses bed at      Patient to be transferred to [facility] on 02/18/18.  Patient to be transferred to facility by [facility]     Patient family notified on 02/15/18 of transfer.  Name of family member notified:  pt daughter Roselyn Reef      PHYSICIAN Please prepare priority discharge summary, including medications, Please sign DNR, Please prepare prescriptions     Additional Comment:    _______________________________________________ Alexander Mt, LCSWA 02/18/2018, 10:11 AM

## 2018-02-18 NOTE — Progress Notes (Signed)
Physical Therapy Treatment Patient Details Name: John Lyons MRN: [id number] DOB: [birth year] Today's Date: 02/18/2018    History of Present Illness John Lyons a 72 y.o.malewith medical history significant forAddison's disease, hypertension, hyperlipidemia, rheumatoid arthritis, diverticulosis, presents to the ER complaining of left lower quadrant/left flank sharp pain, ongoing for the past 2 days, 10/10inseverity at its worse,does notradiate, nothing makes it better or worse.Diagnosed with sepsis secondary to acute diverticulitis, possible partial bowel obstruction vs. Ileus, hypokalemia    PT Comments    Pt limited by back pain this session.  Motivated to participate and able to perform gait and transfer training with min/mod A with RW   Follow Up Recommendations  SNF     Equipment Recommendations  None recommended by PT    Recommendations for Other Services       Precautions / Restrictions Precautions Precautions: Fall Restrictions Weight Bearing Restrictions: No    Mobility  Bed Mobility   Bed Mobility: Supine to Sit     Supine to sit: Mod assist     General bed mobility comments: mod A for trunk, pt able to get LEs off the bed with increased time and supervision  Transfers   Equipment used: Rolling walker (2 wheeled) Transfers: Sit to/from Stand Sit to Stand: Mod assist Stand pivot transfers: Mod assist       General transfer comment: pt performs sit <> stand x 3 from elevated bed wtih mod A. pt unable to flex Lt knee, requires both UEs on RW to stand, PT to steady RW  Ambulation/Gait Ambulation/Gait assistance: Min assist Gait Distance (Feet): 15 Feet Assistive device: Rolling walker (2 wheeled)   Gait velocity: decreased   General Gait Details: pt unable to increase gait distance due to back pain, slow gait, decreased step and stride length bilat, mod A needed for turns   Chief Strategy Officer    Modified  [place] (Stroke Patients Only)       Balance             Standing balance-Leahy Scale: Poor Standing balance comment: pt requires mod A for standign balance with attempts to side step, pt has to sit quickly due to LOB                            Cognition Arousal/Alertness: Awake/alert Behavior During Therapy: WFL for tasks assessed/performed Overall Cognitive Status: Within Functional Limits for tasks assessed                                        Exercises      General Comments        Pertinent Vitals/Pain Faces Pain Scale: Hurts even more Pain Location: back, shoulders Pain Descriptors / Indicators: Aching Pain Intervention(s): Limited activity within patient's tolerance;Monitored during session;Repositioned    Home Living                      Prior Function            PT Goals (current goals can now be found in the care plan section) Acute Rehab PT Goals Patient Stated Goal: rehab then home, back to work as soon as feasible Progress towards PT goals: Progressing toward goals    Frequency    Min 2X/week  PT Plan Current plan remains appropriate    Co-evaluation              AM-PAC PT "6 Clicks" Daily Activity  Outcome Measure  Difficulty turning over in bed (including adjusting bedclothes, sheets and blankets)?: A Lot Difficulty moving from lying on back to sitting on the side of the bed? : A Lot   Help needed moving to and from a bed to chair (including a wheelchair)?: A Lot Help needed walking in hospital room?: A Lot Help needed climbing 3-5 steps with a railing? : Total 6 Click Score: 9    End of Session Equipment Utilized During Treatment: Gait belt Activity Tolerance: Patient limited by pain Patient left: in chair;with call bell/phone within reach;with family/visitor present Nurse Communication: Mobility status PT Visit Diagnosis: Unsteadiness on feet (R26.81);Other abnormalities of gait  and mobility (R26.89);Muscle weakness (generalized) (M62.81);History of falling (Z91.81);Pain Pain - part of body: (back)     Time: 1045-1110 PT Time Calculation (min) (ACUTE ONLY): 25 min  Charges:  $Gait Training: 8-22 mins $Therapeutic Activity: 8-22 mins                     John Lyons, PT, DPT   John Lyons 02/18/2018, 11:16 AM

## 2018-02-18 NOTE — Social Work (Addendum)
Clinical Social Worker facilitated patient discharge including contacting patient family and facility to confirm patient discharge plans.  Clinical information faxed to facility and family agreeable with plan.  CSW aware pt will be transported by personal car to [facility].  RN to call [phone] with report prior to discharge.  Clinical Social Worker will sign off for now as social work intervention is no longer needed. Please consult [place] again if new need arises.  Alexander Mt, [place] Social Worker [phone]

## 2018-02-18 NOTE — Discharge Summary (Signed)
John Lyons, is a 72 y.o. male  DOB 1946-02-01  MRN [id number].  Admission date:  02/09/2018  Admitting Physician  [name], MD  Discharge Date:  02/18/2018   Primary MD  [name], MD  Recommendations for primary care physician for things to follow:   1)Take Augmentin antibiotic and probiotic/Lactobacillus acidophilus as prescribed for acute diverticulitis/infection of her bowel 2) recheck CBC/complete blood count and BMP/kidney and electrolyte test on Friday 02/22/18  Admission Diagnosis  Acute diverticulitis [K57.92] Sepsis, due to unspecified organism, unspecified whether acute organ dysfunction present [facility]) [A41.9]   Discharge Diagnosis  Acute diverticulitis [K57.92] Sepsis, due to unspecified organism, unspecified whether acute organ dysfunction present [facility]) [A41.9]    Principal Problem:   Acute diverticulitis Active Problems:   Essential hypertension   Addison's disease ([place])   Rheumatoid arthritis ([place])   GERD (gastroesophageal reflux disease)      Past Medical History:  Diagnosis Date  . Addison's disease ([place])    takes Florinef and Cortef daily  . Anxiety    takes Xanax at bedtime  . Constipation    takes Linzess daily and Miralax as needed as well as Colace  . Diverticulosis   . Dry skin    on legs  . Dysrhythmia    takes Digoxin daily  . GERD (gastroesophageal reflux disease)    takes Pantoprazole daily  . H/O hiatal hernia   . History of blood transfusion   . History of colon polyps    benign  . Hyperlipidemia    takes Pravastatin daily  . Hypertension    takes Amlodipine and Micardis daily  . Joint pain   . Joint swelling   . PONV (postoperative nausea and vomiting)   . Rheumatoid arthritis(714.0)    Rheumatoid-takes Reclast yearly and Plaquenil daily  . Weakness    right hand.Knot on right hand,states came up 2 wks ago,=    Past Surgical  History:  Procedure Laterality Date  . CARDIOVASCULAR STRESS TEST  01/04/12   Dr. Gwenlyn [facility]  . CHOLECYSTECTOMY N/A 08/12/2015   Procedure: LAPAROSCOPIC CHOLECYSTECTOMY;  Surgeon: Mickeal Skinner, MD;  Location: [place];  Service: General;  Laterality: N/A;  . COLONOSCOPY    . COLONOSCOPY WITH ESOPHAGOGASTRODUODENOSCOPY (EGD) AND ESOPHAGEAL DILATION (ED)    . EYE SURGERY     cataracts  . SINUS ENDO WITH FUSION Bilateral 01/01/2018   Procedure: BILATERAL TURBINATE REDUCTION, BILATERAL TOTAL ENDOSCOPIC ETHOMIDECTOMY AND BILATERAL ENDOSCOPIC MAXILLARY ANTROSTOMY WITH TISSUE REMOVAL WITH FUSION;  Surgeon: Leta Baptist, MD;  Location: [place];  Service: ENT;  Laterality: Bilateral;  . TOTAL HIP ARTHROPLASTY  1978/1979   bilat  . TOTAL HIP ARTHROPLASTY  11/01/2015   REVISION OF LEFT TOTAL HIP ARTHROPLASTY WITH BOTH ACETABULAR AND FEMORAL COMPONENTS POSTERIOR APPROACH (Left) - Requesting RNFA  . TOTAL HIP REVISION Left 11/01/2015   Procedure: REVISION OF LEFT TOTAL HIP ARTHROPLASTY WITH BOTH ACETABULAR AND FEMORAL COMPONENTS POSTERIOR APPROACH;  Surgeon: Rod Can, MD;  Location[facility]  OR;  Service: Orthopedics;  Laterality: Left;  Requesting RNFA  . TOTAL KNEE ARTHROPLASTY  1984   Left   . TOTAL KNEE ARTHROPLASTY  02/12/2012   right  . TOTAL KNEE ARTHROPLASTY  02/12/2012   Procedure: TOTAL KNEE ARTHROPLASTY;  Surgeon: Rudean Haskell, MD;  Location: [place];  Service: Orthopedics;  Laterality: Right;  . TURBINATE REDUCTION Bilateral 01/01/2018   Procedure: BILATERAL TURBINATE REDUCTION;  Surgeon: Leta Baptist, MD;  Location: [place];  Service: ENT;  Laterality: Bilateral;  . VASECTOMY  1979       HPI  from the history and physical done on the day of admission:    Patient coming from: Home & is able to ambulate with a walker  Chief Complaint: Abdominal pain   HPI: John Lyons is a 72 y.o. male with medical history significant for  Addison's disease, hypertension, hyperlipidemia, rheumatoid arthritis, diverticulosis, presents to the ER complaining of left lower quadrant/left flank sharp pain ongoing for the past 2 days, 10/10 in severity at its worse, does not radiate, nothing makes it better or worse.  Patient denies any associated nausea, vomiting, diarrhea.  Noted some darker urine.  Denies any chest pain, shortness of breath, cough, dizziness, lethargy.  Of note, patient presented to the ER on 01/08/2018 with similar symptoms, was discharged from the ER with pain meds.  ED Course: Patient noted to have a fever of 101.9, leukocytosis, lactic acidosis.  CT renal stone showed diffuse descending and sigmoid diverticulosis with probable inflammatory changes suggesting diverticulitis.  No free air or abscess formation is present.  Patient was given IV antibiotics, IV fluids.  Patient admitted for further management.    Hospital Course:    Brief summary 72 y.o.malewith medical history significant forAddison's disease, hypertension, hyperlipidemia, rheumatoid arthritis, diverticulosis admitted on 02/09/2018 with abdominal pain and fevers and clinical and imaging findings consistent with diverticulitis. On admission Patient met sepsis criteria with fevers, leukocytosis and lactic acidosis.  Urine and Blood culture negative   Plan:- 1)Sepsis 2/2 acute Diverticulitis-    Remains afebrile,  treated with IV Zosyn restarted on 02/15/2018,   okay to discharge on p.o. Augmentin for additional 7 days,   blood cultures from 02/14/2018 neg so far ,  continue probiotic, use PRN pain medications and antiemetics, tolerating GI soft diet well   2)Possible partial bowel obstruction Vs ileus-  resolved emesis, diarrhea has resolved, overall suspect the pressure small bowel obstruction appears to have resolved , tolerating GI soft diet well , having bowel movements , , surgical input appreciated, may restart Linzess, repeat abdominal x-rays on  02/15/18 without acute abdominal findings  3)FEN/Hypokalemia---- replace and recheck, tolerating full liquid diet well persistent/recurrent hypokalemia may be related to Florinef use,  schedule daily potassium replacements (KCL 20 meq daily)  4)Addison's disease- , patient received stress dose of Solu-Cortef, continue Florinef 0.1 mg daily, and hydrocortisone 20 mg daily ,, potassium supplementation as above #3  5)Hypertension- stable, amlodipine was increased to 10 mg daily for better BP control, okay to restart Telmisartan for now,  6)Dysrhythmia-stable, asymptomatic, continue digoxin  7)Rheumatoid arthritis-  Continue Plaquenil andfolic acid, complains of arthralgia, already on hydrocortisone  8)Mobility related challenges-----patient has generalized weakness, fatigue, has doing difficulties with mobility related activities of daily living, patient has rheumatoid arthritis complains of arthralgia and stiffness, PT/OT recommends SNF rehab  10)Possible Left-sided Pneumonia (LLL)--- cough and fever since resolved, treated with Zosyn and azithromycin, being discharged on Augmentin   Code Status:DNR  Family Communication: Previously discussed with patient's brother and patient's sister  Disposition Plan: SNF   Consultants: General surgery/dietitian/pharmacy  Procedures:  None   Antimicrobials:  Cefepime- stopped 02/13/18  Flagyl -stopped 02/13/18  Zosyn started 02/13/18, restarted 02/15/18   Discharge Condition: stable  Follow UP  Contact information for after-discharge care    Destination    [facility] .   Service:  Skilled Nursing Contact information: [address] [place] [phone]              Diet and Activity recommendation:  As advised  Discharge Instructions     Discharge Instructions    Call MD for:  difficulty breathing, headache or visual  disturbances   Complete by:  As directed    Call MD for:  persistant dizziness or light-headedness   Complete by:  As directed    Call MD for:  persistant nausea and vomiting   Complete by:  As directed    Call MD for:  severe uncontrolled pain   Complete by:  As directed    Call MD for:  temperature >100.4   Complete by:  As directed    Diet - low sodium heart healthy   Complete by:  As directed    Discharge instructions   Complete by:  As directed    1)Take Augmentin antibiotic and probiotic/Lactobacillus acidophilus as prescribed for acute diverticulitis/infection of her bowel 2) recheck CBC/complete blood count and BMP/kidney and electrolyte test on Friday 02/22/18   Increase activity slowly   Complete by:  As directed         Discharge Medications     Allergies as of 02/18/2018      Reactions   Adhesive [tape] Rash   Phenobarbital Rash      Medication List    STOP taking these medications   traMADol 50 MG tablet Commonly known as:  ULTRAM     TAKE these medications   acetaminophen 500 MG tablet Commonly known as:  TYLENOL Take 1,000 mg by mouth every 6 (six) hours as needed (for pain).   ALPRAZolam 0.5 MG tablet Commonly known as:  XANAX Take 1 tablet (0.5 mg total) by mouth at bedtime as needed for anxiety or sleep. What changed:    medication strength  how much to take  when to take this  reasons to take this   amLODipine 10 MG tablet Commonly known as:  NORVASC Take 1 tablet (10 mg total) by mouth daily. For BP Start taking on:  02/19/2018 What changed:    medication strength  how much to take  when to take this  additional instructions   amoxicillin-clavulanate 875-125 MG tablet Commonly known as:  AUGMENTIN Take 1 tablet by mouth 2 (two) times daily for 7 days.   digoxin 0.125 MG tablet Commonly known as:  LANOXIN Take 0.0625 mg by mouth daily.   fludrocortisone 0.1 MG tablet Commonly known as:  FLORINEF Take 0.1 mg by mouth  daily.   folic acid 332 MCG tablet Commonly known as:  FOLVITE Take 400 mcg by mouth daily.   hydrocortisone 20 MG tablet Commonly known as:  CORTEF Take 20 mg by mouth daily.   hydroxychloroquine 200 MG tablet Commonly known as:  PLAQUENIL Take 200 mg by mouth daily.   lactobacillus acidophilus & bulgar chewable tablet Chew 1 tablet by mouth 3 (three) times daily with meals.   LINZESS 145 MCG Caps capsule Generic drug:  linaclotide Take 145 mcg by mouth daily as needed (constipation).   MUCINEX DM 30-600 MG Tb12 Take 0.5-1 tablets by mouth 2 (two) times daily as needed (for coughing/congestion).   multivitamin with minerals Tabs tablet Take 1 tablet by mouth daily.   ondansetron 4 MG tablet Commonly known as:  ZOFRAN Take 1 tablet (4 mg total) by mouth every 6 (six) hours as needed for nausea or vomiting.   oxyCODONE-acetaminophen 5-325 MG tablet Commonly known as:  PERCOCET/ROXICET Take 1 tablet by mouth every 4 (four) hours as needed for moderate pain or severe pain. What changed:  reasons to take this   pantoprazole 40 MG tablet Commonly known as:  PROTONIX Take 40 mg by mouth daily.   polyethylene glycol powder powder Commonly known as:  GLYCOLAX/MIRALAX Take 17 g by mouth daily as needed for mild constipation.   Potassium Chloride ER 20 MEQ Tbcr Take 20 mEq by mouth daily. 1 tab daily by mouth   pravastatin 20 MG tablet Commonly known as:  PRAVACHOL Take 20 mg by mouth at bedtime.   SOOTHE OP Place 1-2 drops into both eyes 2 (two) times daily as needed (for dryness or irritation).   telmisartan 80 MG tablet Commonly known as:  MICARDIS Take 80 mg by mouth daily.   Vitamin D3 1000 units Caps Take 1,000 Units by mouth daily.       Major procedures and Radiology Reports - PLEASE review detailed and final reports for all details, in brief -    Dg Chest 2 View  Result Date: 02/09/2018 CLINICAL DATA:  Severe back pain with fever 3 days. EXAM:  CHEST - 2 VIEW COMPARISON:  01/08/2018 and CT 01/09/2018 FINDINGS: Lungs are adequately inflated without focal airspace consolidation or effusion. Cardiomediastinal silhouette is within normal. There is degenerative change of the spine with a stable mild compression deformity over the mid to lower thoracic spine. Moderate degenerative change of the shoulders. IMPRESSION: No acute cardiopulmonary disease. Stable mild compression fracture over the mid to lower thoracic spine. Electronically Signed   By: Marin Olp M.D.   On: 02/09/2018 12:59   Dg Abd Acute W/chest  Result Date: 02/15/2018 CLINICAL DATA:  Abdominal pain. EXAM: DG ABDOMEN ACUTE W/ 1V CHEST COMPARISON:  Radiographs dated 02/11/2018 and 02/09/2018 and CT scan abdomen dated 02/09/2018 FINDINGS: There is a new focal area of infiltrate and atelectasis at the left lung base medially. Heart size and vascularity are normal. Bowel gas pattern is normal. No visible free air or free fluid in the abdomen. Chronic lucency around the right acetabular component of the total hip prosthesis. Left total hip prosthesis appears unchanged. IMPRESSION: New focal area of infiltrate/atelectasis at the left lung base posterior medially. No other acute abnormalities. Electronically Signed   By: Lorriane Shire M.D.   On: 02/15/2018 15:00   Dg Abd Portable 2v  Result Date: 02/11/2018 CLINICAL DATA:  Severe constipation EXAM: PORTABLE ABDOMEN - 2 VIEW COMPARISON:  CT 02/09/2018, pelvis radiograph 11/01/2015 FINDINGS: Patchy atelectasis at the left base with possible tiny left effusion. Development of dilated small bowel up to 3.2 cm with increased gaseous enlargement of right lower quadrant small and large bowel. Some distal colon gas is present. There are bilateral hip replacements. No definite free air beneath the diaphragm. IMPRESSION: Interim development of dilated small and large bowel since previous CT with prominent gas-filled loops of bowel in the right lower  quadrant. Findings could be secondary to an ileus, however partial bowel obstruction is also  a concern. Electronically Signed   By: Donavan Foil M.D.   On: 02/11/2018 17:19   Ct Renal Stone Study  Result Date: 02/09/2018 CLINICAL DATA:  Left flank pain and hematuria beginning 2 days ago. Stone disease suspected. EXAM: CT ABDOMEN AND PELVIS WITHOUT CONTRAST TECHNIQUE: Multidetector CT imaging of the abdomen and pelvis was performed following the standard protocol without IV contrast. COMPARISON:  CT of the abdomen and pelvis 08/11/2015 FINDINGS: Lower chest: Mild dependent atelectasis is present in the lower lobes bilaterally. No significant effusion is present. Coronary artery calcifications are present. Heart size is normal. Blood pool is hypodense consistent with borderline anemia. Hepatobiliary: 2 subcentimeter lesions in the left lobe of the liver are stable. Likely represent small cysts. Patient is status post cholecystectomy. Common bile duct is within normal limits. Pancreas: Unremarkable. No pancreatic ductal dilatation or surrounding inflammatory changes. Spleen: Normal in size without focal abnormality. Adrenals/Urinary Tract: The adrenal glands are within normal limits bilaterally. Kidneys are unremarkable. There is no stone or mass lesion. No hydronephrosis is present. The ureters are within normal limits bilaterally. Distal ureters are obscured by streak artifact from bilateral hip prostheses. Stomach/Bowel: A small hiatal hernia is present. The stomach and duodenum are otherwise within normal limits. Small bowel is unremarkable. Terminal ileum is within normal limits. The appendix is visualized and normal. Moderate stool is present in the cecum and ascending colon. Transverse colon is within normal limits. Diverticular changes are present in the distal descending and sigmoid colon. Although the area is somewhat obscured by streak artifact, there appears to be some stranding about the sigmoid  colon. No definite free air or abscess formation is present. Vascular/Lymphatic: Atherosclerotic calcifications are present in the aorta and branch vessels without aneurysm. No significant adenopathy is present. No significant adenopathy is present. Reproductive: Prostate is unremarkable. Other: No abdominal wall hernia or abnormality. No abdominopelvic ascites. Musculoskeletal: Bilateral total hip arthroplasty is present. Degenerative changes are noted in the lumbar spine with levoconvex curvature centered at L4-5. Facet degenerative changes are greatest at L4-5 and L5-S1. No focal lytic or blastic lesions are present. IMPRESSION: 1. Diffuse descending and sigmoid diverticulosis with probable inflammatory changes about the sigmoid colon suggesting diverticulitis. The area is somewhat obscured by streak artifact from bilateral hip prostheses. No complicating features are evident. 2. No nephrolithiasis or hydronephrosis. 3.  Aortic Atherosclerosis (ICD10-I70.0). 4. Coronary artery disease. 5. Mild dependent atelectasis at the lung bases bilaterally. Electronically Signed   By: San Morelle M.D.   On: 02/09/2018 16:19    Micro Results    Recent Results (from the past 240 hour(s))  Blood Culture (routine x 2)     Status: None   Collection Time: 02/09/18 11:47 AM  Result Value Ref Range Status   Specimen Description BLOOD RIGHT ANTECUBITAL  Final   Special Requests   Final    BOTTLES DRAWN AEROBIC AND ANAEROBIC Blood Culture results may not be optimal due to an inadequate volume of blood received in culture bottles   Culture   Final    NO GROWTH 5 DAYS Performed at [facility], [place] [address], [place], [place] [address]    Report Status 02/14/2018 FINAL  Final  Blood Culture (routine x 2)     Status: None   Collection Time: 02/09/18 11:57 AM  Result Value Ref Range Status   Specimen Description BLOOD SITE NOT SPECIFIED  Final   Special Requests   Final    BOTTLES DRAWN AEROBIC AND  ANAEROBIC  Blood Culture results may not be optimal due to an inadequate volume of blood received in culture bottles   Culture   Final    NO GROWTH 5 DAYS Performed at [facility] [address], [place], [place] [address]    Report Status 02/14/2018 FINAL  Final  Urine culture     Status: None   Collection Time: 02/09/18  3:26 PM  Result Value Ref Range Status   Specimen Description URINE, CLEAN CATCH  Final   Special Requests NONE  Final   Culture   Final    NO GROWTH Performed at [facility], [place] [address], [place], [place] [address]    Report Status 02/10/2018 FINAL  Final  Culture, blood (Routine X 2) w Reflex to ID Panel     Status: None (Preliminary result)   Collection Time: 02/14/18  4:22 PM  Result Value Ref Range Status   Specimen Description BLOOD LEFT HAND  Final   Special Requests   Final    BOTTLES DRAWN AEROBIC AND ANAEROBIC Blood Culture adequate volume   Culture   Final    NO GROWTH 3 DAYS Performed at [facility], [place] [address] [place], [place] [address]    Report Status PENDING  Incomplete       Today   Subjective    John Lyons today has no new concerns, eating and drinking well, no vomiting , no fevers, abdominal pain is much better, no productive cough, no shortness of breath      Patient has been seen and examined prior to discharge   Objective   Blood pressure (!) 152/70, pulse 96, temperature 99.1 F (37.3 C), temperature source Oral, resp. rate 16, height '5\' 3"'$  (1.6 m), weight 58.7 kg, SpO2 94 %.   Intake/Output Summary (Last 24 hours) at 02/18/2018 1123 Last data filed at 02/18/2018 0600 Gross per 24 hour  Intake 1281.38 ml  Output 475 ml  Net 806.38 ml    Exam Patient is examined daily including today on 02/18/18 , exams remain the same as of yesterday except that has changed   Gen:- Awake Alert,  In no apparent distress  HEENT:- [place].AT, No sclera icterus Neck-Supple Neck,No JVD,.  Lungs-improving air  movement, no wheezing  CV- S1, S2 normal, regular Abd-  +ve B.Sounds, Abd Soft, not distended, no significant abdominal tenderness Extremity/Skin:- No  edema,   good pulses, polyarthralgia without obvious/significant inflammatory changes over the joints Psych-affect is appropriate, oriented x3 Neuro-no new focal deficits, no tremors   Data Review   CBC w Diff:  Lab Results  Component Value Date   WBC 10.4 02/16/2018   HGB 11.3 (L) 02/16/2018   HCT 33.6 (L) 02/16/2018   PLT 428 (H) 02/16/2018   LYMPHOPCT 18 02/16/2018   MONOPCT 8 02/16/2018   EOSPCT 3 02/16/2018   BASOPCT 0 02/16/2018    CMP:  Lab Results  Component Value Date   NA 136 02/17/2018   K 2.8 (L) 02/17/2018   CL 104 02/17/2018   CO2 25 02/17/2018   BUN 5 (L) 02/17/2018   CREATININE 0.60 (L) 02/17/2018   PROT 5.8 (L) 02/09/2018   ALBUMIN 3.0 (L) 02/09/2018   BILITOT 1.7 (H) 02/09/2018   ALKPHOS 64 02/09/2018   AST 16 02/09/2018   ALT 11 02/09/2018  .   Total Discharge time is about 33 minutes  Roxan Hockey M.D on 02/18/2018 at 11:23 AM  Pager---[phone]  Go to [email or web address] - password [facility]  for contact info  [facility] - Office  [phone]

## 2018-02-19 LAB — CULTURE, BLOOD (ROUTINE X 2)
CULTURE: NO GROWTH
SPECIAL REQUESTS: ADEQUATE

## 2018-02-20 DIAGNOSIS — K5792 Diverticulitis of intestine, part unspecified, without perforation or abscess without bleeding: Secondary | ICD-10-CM | POA: Diagnosis not present

## 2018-02-20 DIAGNOSIS — I10 Essential (primary) hypertension: Secondary | ICD-10-CM | POA: Diagnosis not present

## 2018-02-20 DIAGNOSIS — E271 Primary adrenocortical insufficiency: Secondary | ICD-10-CM | POA: Diagnosis not present

## 2018-02-20 DIAGNOSIS — M069 Rheumatoid arthritis, unspecified: Secondary | ICD-10-CM | POA: Diagnosis not present

## 2018-02-21 DIAGNOSIS — K5792 Diverticulitis of intestine, part unspecified, without perforation or abscess without bleeding: Secondary | ICD-10-CM | POA: Diagnosis not present

## 2018-02-21 DIAGNOSIS — M069 Rheumatoid arthritis, unspecified: Secondary | ICD-10-CM | POA: Diagnosis not present

## 2018-02-21 DIAGNOSIS — I10 Essential (primary) hypertension: Secondary | ICD-10-CM | POA: Diagnosis not present

## 2018-02-28 ENCOUNTER — Other Ambulatory Visit: Payer: Self-pay | Admitting: *Deleted

## 2018-02-28 NOTE — Patient Outreach (Signed)
[facility]) Care Management  02/28/2018  Coulton Schlink July 24, 1945 [id number]   Onsite visit. Met with patient in his room.  Patient reports he was independent and working before admission. He reports that he works part time. His daughter and grandson live with him. He reports his wife is a resident at the facility due to Alzheimer's disease.   Patient reports he manages his own medications. He still drives and is independent with ADLs and IADLs. He states that he is getting therapy and he feels he can go home when he gains more strength in his upper body. He states he still needs help getting up out of a chair due to weakness in upper extremities. He reports that his home and office are accommodating for him as he has a walk in shower, lift chair and special deck chair. He feels he can go home in the next week or so. Even though his daughter and grandson live with him they are not home when he is home due to work schedules so he is mostly home alone.  The MD wanted patient to have oversight for a few weeks.   RNCM reviewed [facility] Care management. Patient reports right now he is good and does not wish to participate but wants the information to have as a future reference.  Left [facility] information and RNCM contact.  Plan to sign off Mary E. Laymond Purser, RN, BSN, [place] [phone]) Business Cell  [phone]) Toll Free Office

## 2018-03-08 DIAGNOSIS — M25531 Pain in right wrist: Secondary | ICD-10-CM | POA: Diagnosis not present

## 2018-03-13 DIAGNOSIS — K5792 Diverticulitis of intestine, part unspecified, without perforation or abscess without bleeding: Secondary | ICD-10-CM | POA: Diagnosis not present

## 2018-03-13 DIAGNOSIS — M069 Rheumatoid arthritis, unspecified: Secondary | ICD-10-CM | POA: Diagnosis not present

## 2018-03-13 DIAGNOSIS — E271 Primary adrenocortical insufficiency: Secondary | ICD-10-CM | POA: Diagnosis not present

## 2018-03-13 DIAGNOSIS — I10 Essential (primary) hypertension: Secondary | ICD-10-CM | POA: Diagnosis not present

## 2018-03-19 DIAGNOSIS — K5792 Diverticulitis of intestine, part unspecified, without perforation or abscess without bleeding: Secondary | ICD-10-CM | POA: Diagnosis not present

## 2018-03-19 DIAGNOSIS — Z6821 Body mass index (BMI) 21.0-21.9, adult: Secondary | ICD-10-CM | POA: Diagnosis not present

## 2018-03-19 DIAGNOSIS — E2740 Unspecified adrenocortical insufficiency: Secondary | ICD-10-CM | POA: Diagnosis not present

## 2018-03-19 DIAGNOSIS — D848 Other specified immunodeficiencies: Secondary | ICD-10-CM | POA: Diagnosis not present

## 2018-03-19 DIAGNOSIS — M069 Rheumatoid arthritis, unspecified: Secondary | ICD-10-CM | POA: Diagnosis not present

## 2018-03-20 DIAGNOSIS — M150 Primary generalized (osteo)arthritis: Secondary | ICD-10-CM | POA: Diagnosis not present

## 2018-03-20 DIAGNOSIS — E271 Primary adrenocortical insufficiency: Secondary | ICD-10-CM | POA: Diagnosis not present

## 2018-03-20 DIAGNOSIS — R5383 Other fatigue: Secondary | ICD-10-CM | POA: Diagnosis not present

## 2018-03-20 DIAGNOSIS — Z79899 Other long term (current) drug therapy: Secondary | ICD-10-CM | POA: Diagnosis not present

## 2018-03-20 DIAGNOSIS — I10 Essential (primary) hypertension: Secondary | ICD-10-CM | POA: Diagnosis not present

## 2018-03-20 DIAGNOSIS — Z7952 Long term (current) use of systemic steroids: Secondary | ICD-10-CM | POA: Diagnosis not present

## 2018-03-20 DIAGNOSIS — M069 Rheumatoid arthritis, unspecified: Secondary | ICD-10-CM | POA: Diagnosis not present

## 2018-03-20 DIAGNOSIS — Z79891 Long term (current) use of opiate analgesic: Secondary | ICD-10-CM | POA: Diagnosis not present

## 2018-03-20 DIAGNOSIS — M0589 Other rheumatoid arthritis with rheumatoid factor of multiple sites: Secondary | ICD-10-CM | POA: Diagnosis not present

## 2018-03-20 DIAGNOSIS — M810 Age-related osteoporosis without current pathological fracture: Secondary | ICD-10-CM | POA: Diagnosis not present

## 2018-03-20 DIAGNOSIS — Z96653 Presence of artificial knee joint, bilateral: Secondary | ICD-10-CM | POA: Diagnosis not present

## 2018-03-20 DIAGNOSIS — Z6822 Body mass index (BMI) 22.0-22.9, adult: Secondary | ICD-10-CM | POA: Diagnosis not present

## 2018-03-20 DIAGNOSIS — Z742 Need for assistance at home and no other household member able to render care: Secondary | ICD-10-CM | POA: Diagnosis not present

## 2018-03-20 DIAGNOSIS — G894 Chronic pain syndrome: Secondary | ICD-10-CM | POA: Diagnosis not present

## 2018-03-20 DIAGNOSIS — Z96643 Presence of artificial hip joint, bilateral: Secondary | ICD-10-CM | POA: Diagnosis not present

## 2018-03-20 DIAGNOSIS — K5792 Diverticulitis of intestine, part unspecified, without perforation or abscess without bleeding: Secondary | ICD-10-CM | POA: Diagnosis not present

## 2018-03-20 DIAGNOSIS — R2689 Other abnormalities of gait and mobility: Secondary | ICD-10-CM | POA: Diagnosis not present

## 2018-03-22 ENCOUNTER — Other Ambulatory Visit (HOSPITAL_COMMUNITY): Payer: Self-pay | Admitting: Internal Medicine

## 2018-03-22 DIAGNOSIS — K5792 Diverticulitis of intestine, part unspecified, without perforation or abscess without bleeding: Secondary | ICD-10-CM | POA: Diagnosis not present

## 2018-03-22 DIAGNOSIS — Z742 Need for assistance at home and no other household member able to render care: Secondary | ICD-10-CM | POA: Diagnosis not present

## 2018-03-22 DIAGNOSIS — M069 Rheumatoid arthritis, unspecified: Secondary | ICD-10-CM | POA: Diagnosis not present

## 2018-03-22 DIAGNOSIS — R2689 Other abnormalities of gait and mobility: Secondary | ICD-10-CM | POA: Diagnosis not present

## 2018-03-22 DIAGNOSIS — I10 Essential (primary) hypertension: Secondary | ICD-10-CM | POA: Diagnosis not present

## 2018-03-22 DIAGNOSIS — E271 Primary adrenocortical insufficiency: Secondary | ICD-10-CM | POA: Diagnosis not present

## 2018-03-25 ENCOUNTER — Encounter (HOSPITAL_COMMUNITY): Payer: Self-pay

## 2018-03-25 ENCOUNTER — Ambulatory Visit (HOSPITAL_COMMUNITY)
Admission: RE | Admit: 2018-03-25 | Discharge: 2018-03-25 | Disposition: A | Discharge status: Home / Self Care | Payer: Medicare Other | Source: Ambulatory Visit | Attending: Internal Medicine | Admitting: Internal Medicine

## 2018-03-25 DIAGNOSIS — K5792 Diverticulitis of intestine, part unspecified, without perforation or abscess without bleeding: Secondary | ICD-10-CM | POA: Insufficient documentation

## 2018-03-25 MED ORDER — SODIUM CHLORIDE (PF) 0.9 % IJ SOLN
INTRAMUSCULAR | Status: AC
  Filled 2018-03-25: qty 50

## 2018-03-25 MED ORDER — IOHEXOL 300 MG/ML  SOLN
100.0000 mL | Freq: Once | INTRAMUSCULAR | Status: AC | PRN
  Administered 2018-03-25: 100 mL via INTRAVENOUS

## 2018-03-26 DIAGNOSIS — M069 Rheumatoid arthritis, unspecified: Secondary | ICD-10-CM | POA: Diagnosis not present

## 2018-03-26 DIAGNOSIS — R2689 Other abnormalities of gait and mobility: Secondary | ICD-10-CM | POA: Diagnosis not present

## 2018-03-26 DIAGNOSIS — I10 Essential (primary) hypertension: Secondary | ICD-10-CM | POA: Diagnosis not present

## 2018-03-26 DIAGNOSIS — Z742 Need for assistance at home and no other household member able to render care: Secondary | ICD-10-CM | POA: Diagnosis not present

## 2018-03-26 DIAGNOSIS — E271 Primary adrenocortical insufficiency: Secondary | ICD-10-CM | POA: Diagnosis not present

## 2018-03-26 DIAGNOSIS — K5792 Diverticulitis of intestine, part unspecified, without perforation or abscess without bleeding: Secondary | ICD-10-CM | POA: Diagnosis not present

## 2018-03-27 DIAGNOSIS — R2689 Other abnormalities of gait and mobility: Secondary | ICD-10-CM | POA: Diagnosis not present

## 2018-03-27 DIAGNOSIS — I10 Essential (primary) hypertension: Secondary | ICD-10-CM | POA: Diagnosis not present

## 2018-03-27 DIAGNOSIS — E271 Primary adrenocortical insufficiency: Secondary | ICD-10-CM | POA: Diagnosis not present

## 2018-03-27 DIAGNOSIS — M069 Rheumatoid arthritis, unspecified: Secondary | ICD-10-CM | POA: Diagnosis not present

## 2018-03-27 DIAGNOSIS — K5792 Diverticulitis of intestine, part unspecified, without perforation or abscess without bleeding: Secondary | ICD-10-CM | POA: Diagnosis not present

## 2018-03-27 DIAGNOSIS — Z742 Need for assistance at home and no other household member able to render care: Secondary | ICD-10-CM | POA: Diagnosis not present

## 2018-03-28 DIAGNOSIS — M069 Rheumatoid arthritis, unspecified: Secondary | ICD-10-CM | POA: Diagnosis not present

## 2018-03-28 DIAGNOSIS — K5792 Diverticulitis of intestine, part unspecified, without perforation or abscess without bleeding: Secondary | ICD-10-CM | POA: Diagnosis not present

## 2018-03-28 DIAGNOSIS — E271 Primary adrenocortical insufficiency: Secondary | ICD-10-CM | POA: Diagnosis not present

## 2018-03-28 DIAGNOSIS — Z742 Need for assistance at home and no other household member able to render care: Secondary | ICD-10-CM | POA: Diagnosis not present

## 2018-03-28 DIAGNOSIS — R2689 Other abnormalities of gait and mobility: Secondary | ICD-10-CM | POA: Diagnosis not present

## 2018-03-28 DIAGNOSIS — I10 Essential (primary) hypertension: Secondary | ICD-10-CM | POA: Diagnosis not present

## 2018-04-02 DIAGNOSIS — E271 Primary adrenocortical insufficiency: Secondary | ICD-10-CM | POA: Diagnosis not present

## 2018-04-02 DIAGNOSIS — I10 Essential (primary) hypertension: Secondary | ICD-10-CM | POA: Diagnosis not present

## 2018-04-02 DIAGNOSIS — M069 Rheumatoid arthritis, unspecified: Secondary | ICD-10-CM | POA: Diagnosis not present

## 2018-04-02 DIAGNOSIS — Z742 Need for assistance at home and no other household member able to render care: Secondary | ICD-10-CM | POA: Diagnosis not present

## 2018-04-02 DIAGNOSIS — K5792 Diverticulitis of intestine, part unspecified, without perforation or abscess without bleeding: Secondary | ICD-10-CM | POA: Diagnosis not present

## 2018-04-02 DIAGNOSIS — R2689 Other abnormalities of gait and mobility: Secondary | ICD-10-CM | POA: Diagnosis not present

## 2018-05-08 DIAGNOSIS — Z6822 Body mass index (BMI) 22.0-22.9, adult: Secondary | ICD-10-CM | POA: Diagnosis not present

## 2018-05-08 DIAGNOSIS — M150 Primary generalized (osteo)arthritis: Secondary | ICD-10-CM | POA: Diagnosis not present

## 2018-05-08 DIAGNOSIS — E271 Primary adrenocortical insufficiency: Secondary | ICD-10-CM | POA: Diagnosis not present

## 2018-05-08 DIAGNOSIS — M0589 Other rheumatoid arthritis with rheumatoid factor of multiple sites: Secondary | ICD-10-CM | POA: Diagnosis not present

## 2018-05-08 DIAGNOSIS — M810 Age-related osteoporosis without current pathological fracture: Secondary | ICD-10-CM | POA: Diagnosis not present

## 2018-05-08 DIAGNOSIS — G894 Chronic pain syndrome: Secondary | ICD-10-CM | POA: Diagnosis not present

## 2018-07-02 DIAGNOSIS — J310 Chronic rhinitis: Secondary | ICD-10-CM | POA: Diagnosis not present

## 2018-07-02 DIAGNOSIS — J320 Chronic maxillary sinusitis: Secondary | ICD-10-CM | POA: Diagnosis not present

## 2018-07-02 DIAGNOSIS — J322 Chronic ethmoidal sinusitis: Secondary | ICD-10-CM | POA: Diagnosis not present

## 2018-07-30 DIAGNOSIS — M810 Age-related osteoporosis without current pathological fracture: Secondary | ICD-10-CM | POA: Diagnosis not present

## 2018-07-30 DIAGNOSIS — I10 Essential (primary) hypertension: Secondary | ICD-10-CM | POA: Diagnosis not present

## 2018-07-30 DIAGNOSIS — Z125 Encounter for screening for malignant neoplasm of prostate: Secondary | ICD-10-CM | POA: Diagnosis not present

## 2018-07-30 DIAGNOSIS — E7849 Other hyperlipidemia: Secondary | ICD-10-CM | POA: Diagnosis not present

## 2018-07-31 DIAGNOSIS — R82998 Other abnormal findings in urine: Secondary | ICD-10-CM | POA: Diagnosis not present

## 2018-08-06 DIAGNOSIS — M810 Age-related osteoporosis without current pathological fracture: Secondary | ICD-10-CM | POA: Diagnosis not present

## 2018-08-06 DIAGNOSIS — E785 Hyperlipidemia, unspecified: Secondary | ICD-10-CM | POA: Diagnosis not present

## 2018-08-06 DIAGNOSIS — Z1331 Encounter for screening for depression: Secondary | ICD-10-CM | POA: Diagnosis not present

## 2018-08-06 DIAGNOSIS — I700 Atherosclerosis of aorta: Secondary | ICD-10-CM | POA: Diagnosis not present

## 2018-08-06 DIAGNOSIS — Z0000 Encounter for general adult medical examination without abnormal findings: Secondary | ICD-10-CM | POA: Diagnosis not present

## 2018-08-06 DIAGNOSIS — E2740 Unspecified adrenocortical insufficiency: Secondary | ICD-10-CM | POA: Diagnosis not present

## 2018-08-06 DIAGNOSIS — R0789 Other chest pain: Secondary | ICD-10-CM | POA: Diagnosis not present

## 2018-08-06 DIAGNOSIS — R3915 Urgency of urination: Secondary | ICD-10-CM | POA: Diagnosis not present

## 2018-08-06 DIAGNOSIS — J329 Chronic sinusitis, unspecified: Secondary | ICD-10-CM | POA: Diagnosis not present

## 2018-08-06 DIAGNOSIS — M069 Rheumatoid arthritis, unspecified: Secondary | ICD-10-CM | POA: Diagnosis not present

## 2018-08-06 DIAGNOSIS — D849 Immunodeficiency, unspecified: Secondary | ICD-10-CM | POA: Diagnosis not present

## 2018-08-06 DIAGNOSIS — D509 Iron deficiency anemia, unspecified: Secondary | ICD-10-CM | POA: Diagnosis not present

## 2018-09-30 DIAGNOSIS — M810 Age-related osteoporosis without current pathological fracture: Secondary | ICD-10-CM | POA: Diagnosis not present

## 2018-10-07 DIAGNOSIS — M810 Age-related osteoporosis without current pathological fracture: Secondary | ICD-10-CM | POA: Diagnosis not present

## 2018-10-29 DIAGNOSIS — M810 Age-related osteoporosis without current pathological fracture: Secondary | ICD-10-CM | POA: Diagnosis not present

## 2018-10-29 DIAGNOSIS — M0589 Other rheumatoid arthritis with rheumatoid factor of multiple sites: Secondary | ICD-10-CM | POA: Diagnosis not present

## 2018-10-29 DIAGNOSIS — Z6823 Body mass index (BMI) 23.0-23.9, adult: Secondary | ICD-10-CM | POA: Diagnosis not present

## 2018-10-29 DIAGNOSIS — E271 Primary adrenocortical insufficiency: Secondary | ICD-10-CM | POA: Diagnosis not present

## 2018-10-29 DIAGNOSIS — G894 Chronic pain syndrome: Secondary | ICD-10-CM | POA: Diagnosis not present

## 2018-10-29 DIAGNOSIS — M150 Primary generalized (osteo)arthritis: Secondary | ICD-10-CM | POA: Diagnosis not present

## 2018-12-09 DIAGNOSIS — L718 Other rosacea: Secondary | ICD-10-CM | POA: Diagnosis not present

## 2018-12-09 DIAGNOSIS — L218 Other seborrheic dermatitis: Secondary | ICD-10-CM | POA: Diagnosis not present

## 2019-01-07 DIAGNOSIS — J322 Chronic ethmoidal sinusitis: Secondary | ICD-10-CM | POA: Diagnosis not present

## 2019-01-07 DIAGNOSIS — J310 Chronic rhinitis: Secondary | ICD-10-CM | POA: Diagnosis not present

## 2019-01-07 DIAGNOSIS — J320 Chronic maxillary sinusitis: Secondary | ICD-10-CM | POA: Diagnosis not present

## 2019-01-29 DIAGNOSIS — E271 Primary adrenocortical insufficiency: Secondary | ICD-10-CM | POA: Diagnosis not present

## 2019-01-29 DIAGNOSIS — M0589 Other rheumatoid arthritis with rheumatoid factor of multiple sites: Secondary | ICD-10-CM | POA: Diagnosis not present

## 2019-01-29 DIAGNOSIS — M150 Primary generalized (osteo)arthritis: Secondary | ICD-10-CM | POA: Diagnosis not present

## 2019-01-29 DIAGNOSIS — M810 Age-related osteoporosis without current pathological fracture: Secondary | ICD-10-CM | POA: Diagnosis not present

## 2019-01-29 DIAGNOSIS — Z6824 Body mass index (BMI) 24.0-24.9, adult: Secondary | ICD-10-CM | POA: Diagnosis not present

## 2019-02-06 DIAGNOSIS — K219 Gastro-esophageal reflux disease without esophagitis: Secondary | ICD-10-CM | POA: Diagnosis not present

## 2019-02-06 DIAGNOSIS — M810 Age-related osteoporosis without current pathological fracture: Secondary | ICD-10-CM | POA: Diagnosis not present

## 2019-02-06 DIAGNOSIS — Z23 Encounter for immunization: Secondary | ICD-10-CM | POA: Diagnosis not present

## 2019-02-06 DIAGNOSIS — I10 Essential (primary) hypertension: Secondary | ICD-10-CM | POA: Diagnosis not present

## 2019-02-06 DIAGNOSIS — F329 Major depressive disorder, single episode, unspecified: Secondary | ICD-10-CM | POA: Diagnosis not present

## 2019-02-06 DIAGNOSIS — D473 Essential (hemorrhagic) thrombocythemia: Secondary | ICD-10-CM | POA: Diagnosis not present

## 2019-02-06 DIAGNOSIS — E2740 Unspecified adrenocortical insufficiency: Secondary | ICD-10-CM | POA: Diagnosis not present

## 2019-02-06 DIAGNOSIS — R21 Rash and other nonspecific skin eruption: Secondary | ICD-10-CM | POA: Diagnosis not present

## 2019-02-06 DIAGNOSIS — M069 Rheumatoid arthritis, unspecified: Secondary | ICD-10-CM | POA: Diagnosis not present

## 2019-02-06 DIAGNOSIS — D509 Iron deficiency anemia, unspecified: Secondary | ICD-10-CM | POA: Diagnosis not present

## 2019-02-06 DIAGNOSIS — D849 Immunodeficiency, unspecified: Secondary | ICD-10-CM | POA: Diagnosis not present

## 2019-03-14 DIAGNOSIS — M810 Age-related osteoporosis without current pathological fracture: Secondary | ICD-10-CM | POA: Diagnosis not present

## 2019-05-01 DIAGNOSIS — L718 Other rosacea: Secondary | ICD-10-CM | POA: Diagnosis not present

## 2019-05-06 DIAGNOSIS — Z23 Encounter for immunization: Secondary | ICD-10-CM | POA: Diagnosis not present

## 2019-05-07 DIAGNOSIS — M0589 Other rheumatoid arthritis with rheumatoid factor of multiple sites: Secondary | ICD-10-CM | POA: Diagnosis not present

## 2019-06-06 DIAGNOSIS — Z23 Encounter for immunization: Secondary | ICD-10-CM | POA: Diagnosis not present

## 2019-07-07 DIAGNOSIS — E271 Primary adrenocortical insufficiency: Secondary | ICD-10-CM | POA: Diagnosis not present

## 2019-07-07 DIAGNOSIS — M810 Age-related osteoporosis without current pathological fracture: Secondary | ICD-10-CM | POA: Diagnosis not present

## 2019-07-07 DIAGNOSIS — M0589 Other rheumatoid arthritis with rheumatoid factor of multiple sites: Secondary | ICD-10-CM | POA: Diagnosis not present

## 2019-07-07 DIAGNOSIS — Z6823 Body mass index (BMI) 23.0-23.9, adult: Secondary | ICD-10-CM | POA: Diagnosis not present

## 2019-07-07 DIAGNOSIS — M150 Primary generalized (osteo)arthritis: Secondary | ICD-10-CM | POA: Diagnosis not present

## 2019-08-26 DIAGNOSIS — Z125 Encounter for screening for malignant neoplasm of prostate: Secondary | ICD-10-CM | POA: Diagnosis not present

## 2019-08-26 DIAGNOSIS — M810 Age-related osteoporosis without current pathological fracture: Secondary | ICD-10-CM | POA: Diagnosis not present

## 2019-08-26 DIAGNOSIS — E7849 Other hyperlipidemia: Secondary | ICD-10-CM | POA: Diagnosis not present

## 2019-09-02 DIAGNOSIS — I700 Atherosclerosis of aorta: Secondary | ICD-10-CM | POA: Diagnosis not present

## 2019-09-02 DIAGNOSIS — N2889 Other specified disorders of kidney and ureter: Secondary | ICD-10-CM | POA: Diagnosis not present

## 2019-09-02 DIAGNOSIS — Z96643 Presence of artificial hip joint, bilateral: Secondary | ICD-10-CM | POA: Diagnosis not present

## 2019-09-02 DIAGNOSIS — R82998 Other abnormal findings in urine: Secondary | ICD-10-CM | POA: Diagnosis not present

## 2019-09-02 DIAGNOSIS — Z1331 Encounter for screening for depression: Secondary | ICD-10-CM | POA: Diagnosis not present

## 2019-09-02 DIAGNOSIS — M069 Rheumatoid arthritis, unspecified: Secondary | ICD-10-CM | POA: Diagnosis not present

## 2019-09-02 DIAGNOSIS — R600 Localized edema: Secondary | ICD-10-CM | POA: Diagnosis not present

## 2019-09-02 DIAGNOSIS — D849 Immunodeficiency, unspecified: Secondary | ICD-10-CM | POA: Diagnosis not present

## 2019-09-02 DIAGNOSIS — M810 Age-related osteoporosis without current pathological fracture: Secondary | ICD-10-CM | POA: Diagnosis not present

## 2019-09-02 DIAGNOSIS — Z9049 Acquired absence of other specified parts of digestive tract: Secondary | ICD-10-CM | POA: Diagnosis not present

## 2019-09-02 DIAGNOSIS — E2740 Unspecified adrenocortical insufficiency: Secondary | ICD-10-CM | POA: Diagnosis not present

## 2019-09-02 DIAGNOSIS — R21 Rash and other nonspecific skin eruption: Secondary | ICD-10-CM | POA: Diagnosis not present

## 2019-09-02 DIAGNOSIS — Z0000 Encounter for general adult medical examination without abnormal findings: Secondary | ICD-10-CM | POA: Diagnosis not present

## 2019-09-02 DIAGNOSIS — I10 Essential (primary) hypertension: Secondary | ICD-10-CM | POA: Diagnosis not present

## 2019-09-03 ENCOUNTER — Other Ambulatory Visit (HOSPITAL_COMMUNITY): Payer: Self-pay | Admitting: Internal Medicine

## 2019-09-03 ENCOUNTER — Ambulatory Visit (HOSPITAL_COMMUNITY)
Admission: RE | Admit: 2019-09-03 | Discharge: 2019-09-03 | Disposition: A | Discharge status: Home / Self Care | Payer: Medicare Other | Source: Ambulatory Visit | Attending: Surgery | Admitting: Surgery

## 2019-09-03 ENCOUNTER — Other Ambulatory Visit: Payer: Self-pay

## 2019-09-03 DIAGNOSIS — M79604 Pain in right leg: Secondary | ICD-10-CM | POA: Insufficient documentation

## 2019-09-03 DIAGNOSIS — M79605 Pain in left leg: Secondary | ICD-10-CM | POA: Insufficient documentation

## 2019-09-17 DIAGNOSIS — Z1212 Encounter for screening for malignant neoplasm of rectum: Secondary | ICD-10-CM | POA: Diagnosis not present

## 2019-09-18 DIAGNOSIS — I10 Essential (primary) hypertension: Secondary | ICD-10-CM | POA: Diagnosis not present

## 2019-09-18 DIAGNOSIS — I872 Venous insufficiency (chronic) (peripheral): Secondary | ICD-10-CM | POA: Diagnosis not present

## 2019-09-18 DIAGNOSIS — R600 Localized edema: Secondary | ICD-10-CM | POA: Diagnosis not present

## 2019-09-18 DIAGNOSIS — M069 Rheumatoid arthritis, unspecified: Secondary | ICD-10-CM | POA: Diagnosis not present

## 2019-10-02 DIAGNOSIS — M8000XD Age-related osteoporosis with current pathological fracture, unspecified site, subsequent encounter for fracture with routine healing: Secondary | ICD-10-CM | POA: Diagnosis not present

## 2019-10-02 DIAGNOSIS — M810 Age-related osteoporosis without current pathological fracture: Secondary | ICD-10-CM | POA: Diagnosis not present

## 2019-10-02 DIAGNOSIS — Z79899 Other long term (current) drug therapy: Secondary | ICD-10-CM | POA: Diagnosis not present

## 2019-10-02 DIAGNOSIS — M150 Primary generalized (osteo)arthritis: Secondary | ICD-10-CM | POA: Diagnosis not present

## 2019-10-07 DIAGNOSIS — M810 Age-related osteoporosis without current pathological fracture: Secondary | ICD-10-CM | POA: Diagnosis not present

## 2019-10-08 DIAGNOSIS — M0589 Other rheumatoid arthritis with rheumatoid factor of multiple sites: Secondary | ICD-10-CM | POA: Diagnosis not present

## 2019-10-08 DIAGNOSIS — Z6823 Body mass index (BMI) 23.0-23.9, adult: Secondary | ICD-10-CM | POA: Diagnosis not present

## 2019-10-08 DIAGNOSIS — M150 Primary generalized (osteo)arthritis: Secondary | ICD-10-CM | POA: Diagnosis not present

## 2019-10-08 DIAGNOSIS — M810 Age-related osteoporosis without current pathological fracture: Secondary | ICD-10-CM | POA: Diagnosis not present

## 2019-10-08 DIAGNOSIS — E271 Primary adrenocortical insufficiency: Secondary | ICD-10-CM | POA: Diagnosis not present

## 2020-01-13 DIAGNOSIS — Z23 Encounter for immunization: Secondary | ICD-10-CM | POA: Diagnosis not present

## 2020-03-01 DIAGNOSIS — F329 Major depressive disorder, single episode, unspecified: Secondary | ICD-10-CM | POA: Diagnosis not present

## 2020-03-01 DIAGNOSIS — I872 Venous insufficiency (chronic) (peripheral): Secondary | ICD-10-CM | POA: Diagnosis not present

## 2020-03-01 DIAGNOSIS — R0600 Dyspnea, unspecified: Secondary | ICD-10-CM | POA: Diagnosis not present

## 2020-03-01 DIAGNOSIS — E2740 Unspecified adrenocortical insufficiency: Secondary | ICD-10-CM | POA: Diagnosis not present

## 2020-03-01 DIAGNOSIS — M069 Rheumatoid arthritis, unspecified: Secondary | ICD-10-CM | POA: Diagnosis not present

## 2020-03-01 DIAGNOSIS — D473 Essential (hemorrhagic) thrombocythemia: Secondary | ICD-10-CM | POA: Diagnosis not present

## 2020-03-01 DIAGNOSIS — M810 Age-related osteoporosis without current pathological fracture: Secondary | ICD-10-CM | POA: Diagnosis not present

## 2020-03-01 DIAGNOSIS — D849 Immunodeficiency, unspecified: Secondary | ICD-10-CM | POA: Diagnosis not present

## 2020-03-01 DIAGNOSIS — Z96643 Presence of artificial hip joint, bilateral: Secondary | ICD-10-CM | POA: Diagnosis not present

## 2020-03-01 DIAGNOSIS — D509 Iron deficiency anemia, unspecified: Secondary | ICD-10-CM | POA: Diagnosis not present

## 2020-03-01 DIAGNOSIS — I10 Essential (primary) hypertension: Secondary | ICD-10-CM | POA: Diagnosis not present

## 2020-04-08 DIAGNOSIS — M150 Primary generalized (osteo)arthritis: Secondary | ICD-10-CM | POA: Diagnosis not present

## 2020-04-08 DIAGNOSIS — E663 Overweight: Secondary | ICD-10-CM | POA: Diagnosis not present

## 2020-04-08 DIAGNOSIS — M0589 Other rheumatoid arthritis with rheumatoid factor of multiple sites: Secondary | ICD-10-CM | POA: Diagnosis not present

## 2020-04-08 DIAGNOSIS — M810 Age-related osteoporosis without current pathological fracture: Secondary | ICD-10-CM | POA: Diagnosis not present

## 2020-04-08 DIAGNOSIS — Z6825 Body mass index (BMI) 25.0-25.9, adult: Secondary | ICD-10-CM | POA: Diagnosis not present

## 2020-04-08 DIAGNOSIS — E271 Primary adrenocortical insufficiency: Secondary | ICD-10-CM | POA: Diagnosis not present

## 2020-05-21 DIAGNOSIS — H524 Presbyopia: Secondary | ICD-10-CM | POA: Diagnosis not present

## 2020-05-21 DIAGNOSIS — Z961 Presence of intraocular lens: Secondary | ICD-10-CM | POA: Diagnosis not present

## 2020-05-21 DIAGNOSIS — Z79899 Other long term (current) drug therapy: Secondary | ICD-10-CM | POA: Diagnosis not present

## 2020-06-02 DIAGNOSIS — L718 Other rosacea: Secondary | ICD-10-CM | POA: Diagnosis not present

## 2020-07-07 DIAGNOSIS — M0589 Other rheumatoid arthritis with rheumatoid factor of multiple sites: Secondary | ICD-10-CM | POA: Diagnosis not present

## 2020-08-17 DIAGNOSIS — Z20822 Contact with and (suspected) exposure to covid-19: Secondary | ICD-10-CM | POA: Diagnosis not present

## 2020-08-17 DIAGNOSIS — U071 COVID-19: Secondary | ICD-10-CM | POA: Diagnosis not present

## 2020-08-30 DIAGNOSIS — M810 Age-related osteoporosis without current pathological fracture: Secondary | ICD-10-CM | POA: Diagnosis not present

## 2020-08-30 DIAGNOSIS — E785 Hyperlipidemia, unspecified: Secondary | ICD-10-CM | POA: Diagnosis not present

## 2020-08-30 DIAGNOSIS — Z125 Encounter for screening for malignant neoplasm of prostate: Secondary | ICD-10-CM | POA: Diagnosis not present

## 2020-09-06 DIAGNOSIS — I10 Essential (primary) hypertension: Secondary | ICD-10-CM | POA: Diagnosis not present

## 2020-09-06 DIAGNOSIS — F329 Major depressive disorder, single episode, unspecified: Secondary | ICD-10-CM | POA: Diagnosis not present

## 2020-09-06 DIAGNOSIS — R600 Localized edema: Secondary | ICD-10-CM | POA: Diagnosis not present

## 2020-09-06 DIAGNOSIS — Z0000 Encounter for general adult medical examination without abnormal findings: Secondary | ICD-10-CM | POA: Diagnosis not present

## 2020-09-06 DIAGNOSIS — R82998 Other abnormal findings in urine: Secondary | ICD-10-CM | POA: Diagnosis not present

## 2020-09-06 DIAGNOSIS — Z96643 Presence of artificial hip joint, bilateral: Secondary | ICD-10-CM | POA: Diagnosis not present

## 2020-09-06 DIAGNOSIS — E2740 Unspecified adrenocortical insufficiency: Secondary | ICD-10-CM | POA: Diagnosis not present

## 2020-09-06 DIAGNOSIS — M069 Rheumatoid arthritis, unspecified: Secondary | ICD-10-CM | POA: Diagnosis not present

## 2020-09-06 DIAGNOSIS — E785 Hyperlipidemia, unspecified: Secondary | ICD-10-CM | POA: Diagnosis not present

## 2020-09-06 DIAGNOSIS — I700 Atherosclerosis of aorta: Secondary | ICD-10-CM | POA: Diagnosis not present

## 2020-09-06 DIAGNOSIS — D126 Benign neoplasm of colon, unspecified: Secondary | ICD-10-CM | POA: Diagnosis not present

## 2020-09-06 DIAGNOSIS — M810 Age-related osteoporosis without current pathological fracture: Secondary | ICD-10-CM | POA: Diagnosis not present

## 2020-09-06 DIAGNOSIS — I872 Venous insufficiency (chronic) (peripheral): Secondary | ICD-10-CM | POA: Diagnosis not present

## 2020-09-29 DIAGNOSIS — L570 Actinic keratosis: Secondary | ICD-10-CM | POA: Diagnosis not present

## 2020-09-29 DIAGNOSIS — L718 Other rosacea: Secondary | ICD-10-CM | POA: Diagnosis not present

## 2020-10-07 DIAGNOSIS — M810 Age-related osteoporosis without current pathological fracture: Secondary | ICD-10-CM | POA: Diagnosis not present

## 2020-10-07 DIAGNOSIS — E663 Overweight: Secondary | ICD-10-CM | POA: Diagnosis not present

## 2020-10-07 DIAGNOSIS — M150 Primary generalized (osteo)arthritis: Secondary | ICD-10-CM | POA: Diagnosis not present

## 2020-10-07 DIAGNOSIS — M0589 Other rheumatoid arthritis with rheumatoid factor of multiple sites: Secondary | ICD-10-CM | POA: Diagnosis not present

## 2020-10-07 DIAGNOSIS — Z6826 Body mass index (BMI) 26.0-26.9, adult: Secondary | ICD-10-CM | POA: Diagnosis not present

## 2020-10-07 DIAGNOSIS — E271 Primary adrenocortical insufficiency: Secondary | ICD-10-CM | POA: Diagnosis not present

## 2020-10-21 ENCOUNTER — Ambulatory Visit (INDEPENDENT_AMBULATORY_CARE_PROVIDER_SITE_OTHER): Payer: Medicare Other | Admitting: Podiatry

## 2020-10-21 ENCOUNTER — Ambulatory Visit (INDEPENDENT_AMBULATORY_CARE_PROVIDER_SITE_OTHER): Payer: Medicare Other

## 2020-10-21 ENCOUNTER — Other Ambulatory Visit: Payer: Self-pay

## 2020-10-21 ENCOUNTER — Encounter: Payer: Self-pay | Admitting: Podiatry

## 2020-10-21 DIAGNOSIS — L84 Corns and callosities: Secondary | ICD-10-CM

## 2020-10-21 DIAGNOSIS — M79671 Pain in right foot: Secondary | ICD-10-CM | POA: Diagnosis not present

## 2020-10-21 DIAGNOSIS — M7671 Peroneal tendinitis, right leg: Secondary | ICD-10-CM | POA: Diagnosis not present

## 2020-10-21 NOTE — Progress Notes (Signed)
Subjective:   Patient ID: John Lyons, male   DOB: 75 y.o.   MRN: [id number]   HPI Patient presents with caregiver with significant lesions fifth metatarsal both feet and has inflammation of the tendon right on the outside that is been sore making it hard to walk with poor foot structure and severe history of rheumatoid arthritis.  Patient does not smoke still does work and has not been seen for over 4 years   Review of Systems  All other systems reviewed and are negative.      Objective:  Physical Exam Vitals and nursing note reviewed.  Constitutional:      Appearance: He is well-developed.  Pulmonary:     Effort: Pulmonary effort is normal.  Musculoskeletal:        General: Normal range of motion.  Skin:    General: Skin is warm.  Neurological:     Mental Status: He is alert.    Neurovascular status found to be moderately diminished bilateral with PT pulses intact but diminished and is found to have significant structural malalignment bilateral long-term history of rheumatoid arthritis poorly controlled and does have good digital perfusion well oriented x3.  Found to have pain of the outside of the right foot with inflammation and is noted to have +2/swelling     Assessment:  Plantar keratotic lesions fifth metatarsal both feet pain in the outside of the right foot with significant swelling occurring and moderate nail disease     Plan:  H&P x-rays reviewed all conditions discussed and I did go ahead today and I did do a tendon injection at the peroneal 3 mg Kenalog 5 mg Xylocaine and debrided lesions bilateral no iatrogenic bleeding and this will be done as needed.  Patient will be seen back is encouraged to call questions concerns with her growth.  There is good to be appropriate solution to his problems  X-rays indicate he is got significant structural malalignment of both feet and does have significant indications of rheumatoid arthritis

## 2020-11-21 DIAGNOSIS — I10 Essential (primary) hypertension: Secondary | ICD-10-CM | POA: Diagnosis not present

## 2020-11-21 DIAGNOSIS — K219 Gastro-esophageal reflux disease without esophagitis: Secondary | ICD-10-CM | POA: Diagnosis not present

## 2020-11-21 DIAGNOSIS — M810 Age-related osteoporosis without current pathological fracture: Secondary | ICD-10-CM | POA: Diagnosis not present

## 2020-11-21 DIAGNOSIS — E785 Hyperlipidemia, unspecified: Secondary | ICD-10-CM | POA: Diagnosis not present

## 2020-12-22 DIAGNOSIS — M810 Age-related osteoporosis without current pathological fracture: Secondary | ICD-10-CM | POA: Diagnosis not present

## 2020-12-22 DIAGNOSIS — K219 Gastro-esophageal reflux disease without esophagitis: Secondary | ICD-10-CM | POA: Diagnosis not present

## 2020-12-22 DIAGNOSIS — E785 Hyperlipidemia, unspecified: Secondary | ICD-10-CM | POA: Diagnosis not present

## 2020-12-22 DIAGNOSIS — I10 Essential (primary) hypertension: Secondary | ICD-10-CM | POA: Diagnosis not present

## 2021-01-06 DIAGNOSIS — M0589 Other rheumatoid arthritis with rheumatoid factor of multiple sites: Secondary | ICD-10-CM | POA: Diagnosis not present

## 2021-01-12 DIAGNOSIS — Z86010 Personal history of colonic polyps: Secondary | ICD-10-CM | POA: Diagnosis not present

## 2021-01-12 DIAGNOSIS — K581 Irritable bowel syndrome with constipation: Secondary | ICD-10-CM | POA: Diagnosis not present

## 2021-01-20 DIAGNOSIS — Z23 Encounter for immunization: Secondary | ICD-10-CM | POA: Diagnosis not present

## 2021-01-21 DIAGNOSIS — E785 Hyperlipidemia, unspecified: Secondary | ICD-10-CM | POA: Diagnosis not present

## 2021-01-21 DIAGNOSIS — K219 Gastro-esophageal reflux disease without esophagitis: Secondary | ICD-10-CM | POA: Diagnosis not present

## 2021-01-21 DIAGNOSIS — I10 Essential (primary) hypertension: Secondary | ICD-10-CM | POA: Diagnosis not present

## 2021-01-21 DIAGNOSIS — M810 Age-related osteoporosis without current pathological fracture: Secondary | ICD-10-CM | POA: Diagnosis not present

## 2021-02-21 DIAGNOSIS — E785 Hyperlipidemia, unspecified: Secondary | ICD-10-CM | POA: Diagnosis not present

## 2021-02-21 DIAGNOSIS — K219 Gastro-esophageal reflux disease without esophagitis: Secondary | ICD-10-CM | POA: Diagnosis not present

## 2021-02-21 DIAGNOSIS — M810 Age-related osteoporosis without current pathological fracture: Secondary | ICD-10-CM | POA: Diagnosis not present

## 2021-02-21 DIAGNOSIS — I10 Essential (primary) hypertension: Secondary | ICD-10-CM | POA: Diagnosis not present

## 2021-02-26 DIAGNOSIS — Z23 Encounter for immunization: Secondary | ICD-10-CM | POA: Diagnosis not present

## 2021-02-26 DIAGNOSIS — U071 COVID-19: Secondary | ICD-10-CM | POA: Diagnosis not present

## 2021-03-14 DIAGNOSIS — Z23 Encounter for immunization: Secondary | ICD-10-CM | POA: Diagnosis not present

## 2021-03-14 DIAGNOSIS — E2740 Unspecified adrenocortical insufficiency: Secondary | ICD-10-CM | POA: Diagnosis not present

## 2021-03-14 DIAGNOSIS — I872 Venous insufficiency (chronic) (peripheral): Secondary | ICD-10-CM | POA: Diagnosis not present

## 2021-03-14 DIAGNOSIS — F329 Major depressive disorder, single episode, unspecified: Secondary | ICD-10-CM | POA: Diagnosis not present

## 2021-03-14 DIAGNOSIS — R609 Edema, unspecified: Secondary | ICD-10-CM | POA: Diagnosis not present

## 2021-03-14 DIAGNOSIS — M810 Age-related osteoporosis without current pathological fracture: Secondary | ICD-10-CM | POA: Diagnosis not present

## 2021-03-14 DIAGNOSIS — I10 Essential (primary) hypertension: Secondary | ICD-10-CM | POA: Diagnosis not present

## 2021-03-14 DIAGNOSIS — E785 Hyperlipidemia, unspecified: Secondary | ICD-10-CM | POA: Diagnosis not present

## 2021-03-14 DIAGNOSIS — I700 Atherosclerosis of aorta: Secondary | ICD-10-CM | POA: Diagnosis not present

## 2021-03-14 DIAGNOSIS — D473 Essential (hemorrhagic) thrombocythemia: Secondary | ICD-10-CM | POA: Diagnosis not present

## 2021-03-14 DIAGNOSIS — D849 Immunodeficiency, unspecified: Secondary | ICD-10-CM | POA: Diagnosis not present

## 2021-03-14 DIAGNOSIS — M069 Rheumatoid arthritis, unspecified: Secondary | ICD-10-CM | POA: Diagnosis not present

## 2021-03-23 DIAGNOSIS — I10 Essential (primary) hypertension: Secondary | ICD-10-CM | POA: Diagnosis not present

## 2021-03-23 DIAGNOSIS — E785 Hyperlipidemia, unspecified: Secondary | ICD-10-CM | POA: Diagnosis not present

## 2021-03-23 DIAGNOSIS — M810 Age-related osteoporosis without current pathological fracture: Secondary | ICD-10-CM | POA: Diagnosis not present

## 2021-03-25 DIAGNOSIS — U071 COVID-19: Secondary | ICD-10-CM | POA: Diagnosis not present

## 2021-03-30 DIAGNOSIS — I10 Essential (primary) hypertension: Secondary | ICD-10-CM | POA: Diagnosis not present

## 2021-04-19 DIAGNOSIS — M150 Primary generalized (osteo)arthritis: Secondary | ICD-10-CM | POA: Diagnosis not present

## 2021-04-19 DIAGNOSIS — M810 Age-related osteoporosis without current pathological fracture: Secondary | ICD-10-CM | POA: Diagnosis not present

## 2021-04-19 DIAGNOSIS — Z6826 Body mass index (BMI) 26.0-26.9, adult: Secondary | ICD-10-CM | POA: Diagnosis not present

## 2021-04-19 DIAGNOSIS — E271 Primary adrenocortical insufficiency: Secondary | ICD-10-CM | POA: Diagnosis not present

## 2021-04-19 DIAGNOSIS — M0589 Other rheumatoid arthritis with rheumatoid factor of multiple sites: Secondary | ICD-10-CM | POA: Diagnosis not present

## 2021-04-19 DIAGNOSIS — E663 Overweight: Secondary | ICD-10-CM | POA: Diagnosis not present

## 2021-04-22 DIAGNOSIS — I10 Essential (primary) hypertension: Secondary | ICD-10-CM | POA: Diagnosis not present

## 2021-04-22 DIAGNOSIS — M810 Age-related osteoporosis without current pathological fracture: Secondary | ICD-10-CM | POA: Diagnosis not present

## 2021-04-22 DIAGNOSIS — E785 Hyperlipidemia, unspecified: Secondary | ICD-10-CM | POA: Diagnosis not present

## 2021-04-27 DIAGNOSIS — Z86010 Personal history of colonic polyps: Secondary | ICD-10-CM | POA: Diagnosis not present

## 2021-04-27 DIAGNOSIS — K649 Unspecified hemorrhoids: Secondary | ICD-10-CM | POA: Diagnosis not present

## 2021-04-27 DIAGNOSIS — U071 COVID-19: Secondary | ICD-10-CM | POA: Diagnosis not present

## 2021-04-27 DIAGNOSIS — K5730 Diverticulosis of large intestine without perforation or abscess without bleeding: Secondary | ICD-10-CM | POA: Diagnosis not present

## 2021-05-06 DIAGNOSIS — R059 Cough, unspecified: Secondary | ICD-10-CM | POA: Diagnosis not present

## 2021-05-06 DIAGNOSIS — Z1152 Encounter for screening for COVID-19: Secondary | ICD-10-CM | POA: Diagnosis not present

## 2021-05-06 DIAGNOSIS — I10 Essential (primary) hypertension: Secondary | ICD-10-CM | POA: Diagnosis not present

## 2021-05-06 DIAGNOSIS — R0981 Nasal congestion: Secondary | ICD-10-CM | POA: Diagnosis not present

## 2021-05-06 DIAGNOSIS — R5383 Other fatigue: Secondary | ICD-10-CM | POA: Diagnosis not present

## 2021-05-06 DIAGNOSIS — J069 Acute upper respiratory infection, unspecified: Secondary | ICD-10-CM | POA: Diagnosis not present

## 2021-05-22 DIAGNOSIS — E785 Hyperlipidemia, unspecified: Secondary | ICD-10-CM | POA: Diagnosis not present

## 2021-05-22 DIAGNOSIS — M810 Age-related osteoporosis without current pathological fracture: Secondary | ICD-10-CM | POA: Diagnosis not present

## 2021-05-22 DIAGNOSIS — I10 Essential (primary) hypertension: Secondary | ICD-10-CM | POA: Diagnosis not present

## 2021-06-02 DIAGNOSIS — I10 Essential (primary) hypertension: Secondary | ICD-10-CM | POA: Diagnosis not present

## 2021-06-02 DIAGNOSIS — E785 Hyperlipidemia, unspecified: Secondary | ICD-10-CM | POA: Diagnosis not present

## 2021-06-18 DIAGNOSIS — U071 COVID-19: Secondary | ICD-10-CM | POA: Diagnosis not present

## 2021-06-21 DIAGNOSIS — E785 Hyperlipidemia, unspecified: Secondary | ICD-10-CM | POA: Diagnosis not present

## 2021-06-21 DIAGNOSIS — M810 Age-related osteoporosis without current pathological fracture: Secondary | ICD-10-CM | POA: Diagnosis not present

## 2021-06-21 DIAGNOSIS — I10 Essential (primary) hypertension: Secondary | ICD-10-CM | POA: Diagnosis not present

## 2021-07-19 DIAGNOSIS — M0589 Other rheumatoid arthritis with rheumatoid factor of multiple sites: Secondary | ICD-10-CM | POA: Diagnosis not present

## 2021-07-22 DIAGNOSIS — I10 Essential (primary) hypertension: Secondary | ICD-10-CM | POA: Diagnosis not present

## 2021-07-22 DIAGNOSIS — E785 Hyperlipidemia, unspecified: Secondary | ICD-10-CM | POA: Diagnosis not present

## 2021-07-22 DIAGNOSIS — M810 Age-related osteoporosis without current pathological fracture: Secondary | ICD-10-CM | POA: Diagnosis not present

## 2021-07-30 DIAGNOSIS — U071 COVID-19: Secondary | ICD-10-CM | POA: Diagnosis not present

## 2021-08-21 DIAGNOSIS — E785 Hyperlipidemia, unspecified: Secondary | ICD-10-CM | POA: Diagnosis not present

## 2021-08-21 DIAGNOSIS — M810 Age-related osteoporosis without current pathological fracture: Secondary | ICD-10-CM | POA: Diagnosis not present

## 2021-08-21 DIAGNOSIS — I10 Essential (primary) hypertension: Secondary | ICD-10-CM | POA: Diagnosis not present

## 2021-09-21 DIAGNOSIS — I10 Essential (primary) hypertension: Secondary | ICD-10-CM | POA: Diagnosis not present

## 2021-09-21 DIAGNOSIS — R600 Localized edema: Secondary | ICD-10-CM | POA: Diagnosis not present

## 2021-10-04 DIAGNOSIS — M810 Age-related osteoporosis without current pathological fracture: Secondary | ICD-10-CM | POA: Diagnosis not present

## 2021-10-04 DIAGNOSIS — Z125 Encounter for screening for malignant neoplasm of prostate: Secondary | ICD-10-CM | POA: Diagnosis not present

## 2021-10-04 DIAGNOSIS — E785 Hyperlipidemia, unspecified: Secondary | ICD-10-CM | POA: Diagnosis not present

## 2021-10-04 DIAGNOSIS — I10 Essential (primary) hypertension: Secondary | ICD-10-CM | POA: Diagnosis not present

## 2021-10-11 DIAGNOSIS — N2889 Other specified disorders of kidney and ureter: Secondary | ICD-10-CM | POA: Diagnosis not present

## 2021-10-11 DIAGNOSIS — I700 Atherosclerosis of aorta: Secondary | ICD-10-CM | POA: Diagnosis not present

## 2021-10-11 DIAGNOSIS — I872 Venous insufficiency (chronic) (peripheral): Secondary | ICD-10-CM | POA: Diagnosis not present

## 2021-10-11 DIAGNOSIS — I10 Essential (primary) hypertension: Secondary | ICD-10-CM | POA: Diagnosis not present

## 2021-10-11 DIAGNOSIS — E2740 Unspecified adrenocortical insufficiency: Secondary | ICD-10-CM | POA: Diagnosis not present

## 2021-10-11 DIAGNOSIS — M810 Age-related osteoporosis without current pathological fracture: Secondary | ICD-10-CM | POA: Diagnosis not present

## 2021-10-11 DIAGNOSIS — Z0000 Encounter for general adult medical examination without abnormal findings: Secondary | ICD-10-CM | POA: Diagnosis not present

## 2021-10-11 DIAGNOSIS — R82998 Other abnormal findings in urine: Secondary | ICD-10-CM | POA: Diagnosis not present

## 2021-10-11 DIAGNOSIS — D849 Immunodeficiency, unspecified: Secondary | ICD-10-CM | POA: Diagnosis not present

## 2021-10-11 DIAGNOSIS — Z9049 Acquired absence of other specified parts of digestive tract: Secondary | ICD-10-CM | POA: Diagnosis not present

## 2021-10-11 DIAGNOSIS — R972 Elevated prostate specific antigen [PSA]: Secondary | ICD-10-CM | POA: Diagnosis not present

## 2021-10-11 DIAGNOSIS — M069 Rheumatoid arthritis, unspecified: Secondary | ICD-10-CM | POA: Diagnosis not present

## 2021-10-12 DIAGNOSIS — Z961 Presence of intraocular lens: Secondary | ICD-10-CM | POA: Diagnosis not present

## 2021-10-12 DIAGNOSIS — H524 Presbyopia: Secondary | ICD-10-CM | POA: Diagnosis not present

## 2021-10-12 DIAGNOSIS — Z79899 Other long term (current) drug therapy: Secondary | ICD-10-CM | POA: Diagnosis not present

## 2021-10-19 DIAGNOSIS — E663 Overweight: Secondary | ICD-10-CM | POA: Diagnosis not present

## 2021-10-19 DIAGNOSIS — M1991 Primary osteoarthritis, unspecified site: Secondary | ICD-10-CM | POA: Diagnosis not present

## 2021-10-19 DIAGNOSIS — M810 Age-related osteoporosis without current pathological fracture: Secondary | ICD-10-CM | POA: Diagnosis not present

## 2021-10-19 DIAGNOSIS — Z6826 Body mass index (BMI) 26.0-26.9, adult: Secondary | ICD-10-CM | POA: Diagnosis not present

## 2021-10-19 DIAGNOSIS — M0589 Other rheumatoid arthritis with rheumatoid factor of multiple sites: Secondary | ICD-10-CM | POA: Diagnosis not present

## 2021-10-19 DIAGNOSIS — E271 Primary adrenocortical insufficiency: Secondary | ICD-10-CM | POA: Diagnosis not present

## 2021-10-21 DIAGNOSIS — R600 Localized edema: Secondary | ICD-10-CM | POA: Diagnosis not present

## 2021-10-21 DIAGNOSIS — I10 Essential (primary) hypertension: Secondary | ICD-10-CM | POA: Diagnosis not present

## 2021-10-26 DIAGNOSIS — E2740 Unspecified adrenocortical insufficiency: Secondary | ICD-10-CM | POA: Diagnosis not present

## 2021-10-26 DIAGNOSIS — M810 Age-related osteoporosis without current pathological fracture: Secondary | ICD-10-CM | POA: Diagnosis not present

## 2021-10-26 DIAGNOSIS — I10 Essential (primary) hypertension: Secondary | ICD-10-CM | POA: Diagnosis not present

## 2021-11-08 DIAGNOSIS — Z79899 Other long term (current) drug therapy: Secondary | ICD-10-CM | POA: Diagnosis not present

## 2021-11-21 DIAGNOSIS — I10 Essential (primary) hypertension: Secondary | ICD-10-CM | POA: Diagnosis not present

## 2021-11-21 DIAGNOSIS — R600 Localized edema: Secondary | ICD-10-CM | POA: Diagnosis not present

## 2021-11-30 DIAGNOSIS — R3912 Poor urinary stream: Secondary | ICD-10-CM | POA: Diagnosis not present

## 2021-11-30 DIAGNOSIS — R972 Elevated prostate specific antigen [PSA]: Secondary | ICD-10-CM | POA: Diagnosis not present

## 2021-11-30 DIAGNOSIS — N401 Enlarged prostate with lower urinary tract symptoms: Secondary | ICD-10-CM | POA: Diagnosis not present

## 2022-01-19 DIAGNOSIS — M0589 Other rheumatoid arthritis with rheumatoid factor of multiple sites: Secondary | ICD-10-CM | POA: Diagnosis not present

## 2022-01-21 DIAGNOSIS — Z23 Encounter for immunization: Secondary | ICD-10-CM | POA: Diagnosis not present

## 2022-02-18 DIAGNOSIS — Z23 Encounter for immunization: Secondary | ICD-10-CM | POA: Diagnosis not present

## 2022-02-22 DIAGNOSIS — R972 Elevated prostate specific antigen [PSA]: Secondary | ICD-10-CM | POA: Diagnosis not present

## 2022-03-01 DIAGNOSIS — N401 Enlarged prostate with lower urinary tract symptoms: Secondary | ICD-10-CM | POA: Diagnosis not present

## 2022-03-01 DIAGNOSIS — R972 Elevated prostate specific antigen [PSA]: Secondary | ICD-10-CM | POA: Diagnosis not present

## 2022-03-01 DIAGNOSIS — R3912 Poor urinary stream: Secondary | ICD-10-CM | POA: Diagnosis not present

## 2022-04-11 DIAGNOSIS — M069 Rheumatoid arthritis, unspecified: Secondary | ICD-10-CM | POA: Diagnosis not present

## 2022-04-11 DIAGNOSIS — M810 Age-related osteoporosis without current pathological fracture: Secondary | ICD-10-CM | POA: Diagnosis not present

## 2022-04-11 DIAGNOSIS — Z9049 Acquired absence of other specified parts of digestive tract: Secondary | ICD-10-CM | POA: Diagnosis not present

## 2022-04-11 DIAGNOSIS — Z96643 Presence of artificial hip joint, bilateral: Secondary | ICD-10-CM | POA: Diagnosis not present

## 2022-04-11 DIAGNOSIS — D849 Immunodeficiency, unspecified: Secondary | ICD-10-CM | POA: Diagnosis not present

## 2022-04-11 DIAGNOSIS — I10 Essential (primary) hypertension: Secondary | ICD-10-CM | POA: Diagnosis not present

## 2022-04-11 DIAGNOSIS — L84 Corns and callosities: Secondary | ICD-10-CM | POA: Diagnosis not present

## 2022-04-11 DIAGNOSIS — K219 Gastro-esophageal reflux disease without esophagitis: Secondary | ICD-10-CM | POA: Diagnosis not present

## 2022-04-11 DIAGNOSIS — I700 Atherosclerosis of aorta: Secondary | ICD-10-CM | POA: Diagnosis not present

## 2022-04-11 DIAGNOSIS — R972 Elevated prostate specific antigen [PSA]: Secondary | ICD-10-CM | POA: Diagnosis not present

## 2022-04-26 DIAGNOSIS — M0589 Other rheumatoid arthritis with rheumatoid factor of multiple sites: Secondary | ICD-10-CM | POA: Diagnosis not present

## 2022-04-26 DIAGNOSIS — M1991 Primary osteoarthritis, unspecified site: Secondary | ICD-10-CM | POA: Diagnosis not present

## 2022-04-26 DIAGNOSIS — Z6826 Body mass index (BMI) 26.0-26.9, adult: Secondary | ICD-10-CM | POA: Diagnosis not present

## 2022-04-26 DIAGNOSIS — E271 Primary adrenocortical insufficiency: Secondary | ICD-10-CM | POA: Diagnosis not present

## 2022-04-26 DIAGNOSIS — E663 Overweight: Secondary | ICD-10-CM | POA: Diagnosis not present

## 2022-04-26 DIAGNOSIS — M810 Age-related osteoporosis without current pathological fracture: Secondary | ICD-10-CM | POA: Diagnosis not present

## 2022-07-10 DIAGNOSIS — R051 Acute cough: Secondary | ICD-10-CM | POA: Diagnosis not present

## 2022-07-10 DIAGNOSIS — I10 Essential (primary) hypertension: Secondary | ICD-10-CM | POA: Diagnosis not present

## 2022-07-10 DIAGNOSIS — J309 Allergic rhinitis, unspecified: Secondary | ICD-10-CM | POA: Diagnosis not present

## 2022-07-10 DIAGNOSIS — J069 Acute upper respiratory infection, unspecified: Secondary | ICD-10-CM | POA: Diagnosis not present

## 2022-07-10 DIAGNOSIS — M069 Rheumatoid arthritis, unspecified: Secondary | ICD-10-CM | POA: Diagnosis not present

## 2022-07-10 DIAGNOSIS — U071 COVID-19: Secondary | ICD-10-CM | POA: Diagnosis not present

## 2022-07-27 DIAGNOSIS — M0589 Other rheumatoid arthritis with rheumatoid factor of multiple sites: Secondary | ICD-10-CM | POA: Diagnosis not present

## 2022-09-07 DIAGNOSIS — I8310 Varicose veins of unspecified lower extremity with inflammation: Secondary | ICD-10-CM | POA: Diagnosis not present

## 2022-09-07 DIAGNOSIS — L570 Actinic keratosis: Secondary | ICD-10-CM | POA: Diagnosis not present

## 2022-09-07 DIAGNOSIS — L719 Rosacea, unspecified: Secondary | ICD-10-CM | POA: Diagnosis not present

## 2022-10-10 DIAGNOSIS — D485 Neoplasm of uncertain behavior of skin: Secondary | ICD-10-CM | POA: Diagnosis not present

## 2022-10-10 DIAGNOSIS — I8310 Varicose veins of unspecified lower extremity with inflammation: Secondary | ICD-10-CM | POA: Diagnosis not present

## 2022-10-10 DIAGNOSIS — L719 Rosacea, unspecified: Secondary | ICD-10-CM | POA: Diagnosis not present

## 2022-10-24 DIAGNOSIS — D509 Iron deficiency anemia, unspecified: Secondary | ICD-10-CM | POA: Diagnosis not present

## 2022-10-24 DIAGNOSIS — Z6825 Body mass index (BMI) 25.0-25.9, adult: Secondary | ICD-10-CM | POA: Diagnosis not present

## 2022-10-24 DIAGNOSIS — I10 Essential (primary) hypertension: Secondary | ICD-10-CM | POA: Diagnosis not present

## 2022-10-24 DIAGNOSIS — Z1212 Encounter for screening for malignant neoplasm of rectum: Secondary | ICD-10-CM | POA: Diagnosis not present

## 2022-10-24 DIAGNOSIS — Z125 Encounter for screening for malignant neoplasm of prostate: Secondary | ICD-10-CM | POA: Diagnosis not present

## 2022-10-24 DIAGNOSIS — E663 Overweight: Secondary | ICD-10-CM | POA: Diagnosis not present

## 2022-10-24 DIAGNOSIS — E785 Hyperlipidemia, unspecified: Secondary | ICD-10-CM | POA: Diagnosis not present

## 2022-10-24 DIAGNOSIS — M0589 Other rheumatoid arthritis with rheumatoid factor of multiple sites: Secondary | ICD-10-CM | POA: Diagnosis not present

## 2022-10-24 DIAGNOSIS — M1991 Primary osteoarthritis, unspecified site: Secondary | ICD-10-CM | POA: Diagnosis not present

## 2022-10-24 DIAGNOSIS — M810 Age-related osteoporosis without current pathological fracture: Secondary | ICD-10-CM | POA: Diagnosis not present

## 2022-10-24 DIAGNOSIS — E271 Primary adrenocortical insufficiency: Secondary | ICD-10-CM | POA: Diagnosis not present

## 2022-10-31 DIAGNOSIS — E2740 Unspecified adrenocortical insufficiency: Secondary | ICD-10-CM | POA: Diagnosis not present

## 2022-10-31 DIAGNOSIS — M069 Rheumatoid arthritis, unspecified: Secondary | ICD-10-CM | POA: Diagnosis not present

## 2022-10-31 DIAGNOSIS — Z1389 Encounter for screening for other disorder: Secondary | ICD-10-CM | POA: Diagnosis not present

## 2022-10-31 DIAGNOSIS — K219 Gastro-esophageal reflux disease without esophagitis: Secondary | ICD-10-CM | POA: Diagnosis not present

## 2022-10-31 DIAGNOSIS — D509 Iron deficiency anemia, unspecified: Secondary | ICD-10-CM | POA: Diagnosis not present

## 2022-10-31 DIAGNOSIS — D473 Essential (hemorrhagic) thrombocythemia: Secondary | ICD-10-CM | POA: Diagnosis not present

## 2022-10-31 DIAGNOSIS — D849 Immunodeficiency, unspecified: Secondary | ICD-10-CM | POA: Diagnosis not present

## 2022-10-31 DIAGNOSIS — R000 Tachycardia, unspecified: Secondary | ICD-10-CM | POA: Diagnosis not present

## 2022-10-31 DIAGNOSIS — I872 Venous insufficiency (chronic) (peripheral): Secondary | ICD-10-CM | POA: Diagnosis not present

## 2022-10-31 DIAGNOSIS — D126 Benign neoplasm of colon, unspecified: Secondary | ICD-10-CM | POA: Diagnosis not present

## 2022-10-31 DIAGNOSIS — K5792 Diverticulitis of intestine, part unspecified, without perforation or abscess without bleeding: Secondary | ICD-10-CM | POA: Diagnosis not present

## 2022-10-31 DIAGNOSIS — Z9049 Acquired absence of other specified parts of digestive tract: Secondary | ICD-10-CM | POA: Diagnosis not present

## 2022-10-31 DIAGNOSIS — J309 Allergic rhinitis, unspecified: Secondary | ICD-10-CM | POA: Diagnosis not present

## 2022-10-31 DIAGNOSIS — Z0000 Encounter for general adult medical examination without abnormal findings: Secondary | ICD-10-CM | POA: Diagnosis not present

## 2022-10-31 DIAGNOSIS — R82998 Other abnormal findings in urine: Secondary | ICD-10-CM | POA: Diagnosis not present

## 2022-10-31 DIAGNOSIS — N2889 Other specified disorders of kidney and ureter: Secondary | ICD-10-CM | POA: Diagnosis not present

## 2022-10-31 DIAGNOSIS — L84 Corns and callosities: Secondary | ICD-10-CM | POA: Diagnosis not present

## 2022-10-31 DIAGNOSIS — M810 Age-related osteoporosis without current pathological fracture: Secondary | ICD-10-CM | POA: Diagnosis not present

## 2022-10-31 DIAGNOSIS — R609 Edema, unspecified: Secondary | ICD-10-CM | POA: Diagnosis not present

## 2022-10-31 DIAGNOSIS — I10 Essential (primary) hypertension: Secondary | ICD-10-CM | POA: Diagnosis not present

## 2022-10-31 DIAGNOSIS — I700 Atherosclerosis of aorta: Secondary | ICD-10-CM | POA: Diagnosis not present

## 2022-10-31 DIAGNOSIS — E785 Hyperlipidemia, unspecified: Secondary | ICD-10-CM | POA: Diagnosis not present

## 2022-10-31 DIAGNOSIS — F329 Major depressive disorder, single episode, unspecified: Secondary | ICD-10-CM | POA: Diagnosis not present

## 2022-10-31 DIAGNOSIS — Z96643 Presence of artificial hip joint, bilateral: Secondary | ICD-10-CM | POA: Diagnosis not present

## 2022-11-02 DIAGNOSIS — C44329 Squamous cell carcinoma of skin of other parts of face: Secondary | ICD-10-CM | POA: Diagnosis not present

## 2022-11-10 DIAGNOSIS — M810 Age-related osteoporosis without current pathological fracture: Secondary | ICD-10-CM | POA: Diagnosis not present

## 2022-11-17 DIAGNOSIS — Z79899 Other long term (current) drug therapy: Secondary | ICD-10-CM | POA: Diagnosis not present

## 2022-11-17 DIAGNOSIS — H524 Presbyopia: Secondary | ICD-10-CM | POA: Diagnosis not present

## 2022-11-17 DIAGNOSIS — Z961 Presence of intraocular lens: Secondary | ICD-10-CM | POA: Diagnosis not present

## 2022-12-27 ENCOUNTER — Ambulatory Visit
Admission: RE | Admit: 2022-12-27 | Discharge: 2022-12-27 | Disposition: A | Discharge status: Home / Self Care | Payer: Medicare Other | Source: Ambulatory Visit | Attending: Family Medicine | Admitting: Family Medicine

## 2022-12-27 ENCOUNTER — Other Ambulatory Visit: Payer: Self-pay | Admitting: Family Medicine

## 2022-12-27 DIAGNOSIS — R1032 Left lower quadrant pain: Secondary | ICD-10-CM

## 2022-12-27 DIAGNOSIS — D849 Immunodeficiency, unspecified: Secondary | ICD-10-CM | POA: Diagnosis not present

## 2022-12-27 DIAGNOSIS — K5792 Diverticulitis of intestine, part unspecified, without perforation or abscess without bleeding: Secondary | ICD-10-CM | POA: Diagnosis not present

## 2022-12-27 MED ORDER — IOPAMIDOL (ISOVUE-300) INJECTION 61%
100.0000 mL | Freq: Once | INTRAVENOUS | Status: AC | PRN
  Administered 2022-12-27: 100 mL via INTRAVENOUS

## 2023-01-23 DIAGNOSIS — M0589 Other rheumatoid arthritis with rheumatoid factor of multiple sites: Secondary | ICD-10-CM | POA: Diagnosis not present

## 2023-01-27 DIAGNOSIS — Z23 Encounter for immunization: Secondary | ICD-10-CM | POA: Diagnosis not present

## 2023-02-06 DIAGNOSIS — C4442 Squamous cell carcinoma of skin of scalp and neck: Secondary | ICD-10-CM | POA: Diagnosis not present

## 2023-05-01 DIAGNOSIS — M1991 Primary osteoarthritis, unspecified site: Secondary | ICD-10-CM | POA: Diagnosis not present

## 2023-05-01 DIAGNOSIS — E663 Overweight: Secondary | ICD-10-CM | POA: Diagnosis not present

## 2023-05-01 DIAGNOSIS — M810 Age-related osteoporosis without current pathological fracture: Secondary | ICD-10-CM | POA: Diagnosis not present

## 2023-05-01 DIAGNOSIS — Z6826 Body mass index (BMI) 26.0-26.9, adult: Secondary | ICD-10-CM | POA: Diagnosis not present

## 2023-05-01 DIAGNOSIS — M0589 Other rheumatoid arthritis with rheumatoid factor of multiple sites: Secondary | ICD-10-CM | POA: Diagnosis not present

## 2023-05-01 DIAGNOSIS — E271 Primary adrenocortical insufficiency: Secondary | ICD-10-CM | POA: Diagnosis not present

## 2023-05-15 DIAGNOSIS — M810 Age-related osteoporosis without current pathological fracture: Secondary | ICD-10-CM | POA: Diagnosis not present

## 2023-06-06 DIAGNOSIS — E2740 Unspecified adrenocortical insufficiency: Secondary | ICD-10-CM | POA: Diagnosis not present

## 2023-06-06 DIAGNOSIS — E785 Hyperlipidemia, unspecified: Secondary | ICD-10-CM | POA: Diagnosis not present

## 2023-06-06 DIAGNOSIS — D849 Immunodeficiency, unspecified: Secondary | ICD-10-CM | POA: Diagnosis not present

## 2023-06-06 DIAGNOSIS — F329 Major depressive disorder, single episode, unspecified: Secondary | ICD-10-CM | POA: Diagnosis not present

## 2023-06-06 DIAGNOSIS — D473 Essential (hemorrhagic) thrombocythemia: Secondary | ICD-10-CM | POA: Diagnosis not present

## 2023-06-06 DIAGNOSIS — I10 Essential (primary) hypertension: Secondary | ICD-10-CM | POA: Diagnosis not present

## 2023-06-06 DIAGNOSIS — N2889 Other specified disorders of kidney and ureter: Secondary | ICD-10-CM | POA: Diagnosis not present

## 2023-06-06 DIAGNOSIS — K219 Gastro-esophageal reflux disease without esophagitis: Secondary | ICD-10-CM | POA: Diagnosis not present

## 2023-06-06 DIAGNOSIS — L84 Corns and callosities: Secondary | ICD-10-CM | POA: Diagnosis not present

## 2023-06-06 DIAGNOSIS — I700 Atherosclerosis of aorta: Secondary | ICD-10-CM | POA: Diagnosis not present

## 2023-06-06 DIAGNOSIS — M069 Rheumatoid arthritis, unspecified: Secondary | ICD-10-CM | POA: Diagnosis not present

## 2023-06-06 DIAGNOSIS — M810 Age-related osteoporosis without current pathological fracture: Secondary | ICD-10-CM | POA: Diagnosis not present

## 2023-07-09 DIAGNOSIS — R0981 Nasal congestion: Secondary | ICD-10-CM | POA: Diagnosis not present

## 2023-07-09 DIAGNOSIS — R051 Acute cough: Secondary | ICD-10-CM | POA: Diagnosis not present

## 2023-07-09 DIAGNOSIS — J189 Pneumonia, unspecified organism: Secondary | ICD-10-CM | POA: Diagnosis not present

## 2023-07-09 DIAGNOSIS — Z1152 Encounter for screening for COVID-19: Secondary | ICD-10-CM | POA: Diagnosis not present

## 2023-07-09 DIAGNOSIS — K219 Gastro-esophageal reflux disease without esophagitis: Secondary | ICD-10-CM | POA: Diagnosis not present

## 2023-07-09 DIAGNOSIS — J029 Acute pharyngitis, unspecified: Secondary | ICD-10-CM | POA: Diagnosis not present

## 2023-07-09 DIAGNOSIS — M069 Rheumatoid arthritis, unspecified: Secondary | ICD-10-CM | POA: Diagnosis not present

## 2023-07-09 DIAGNOSIS — R509 Fever, unspecified: Secondary | ICD-10-CM | POA: Diagnosis not present

## 2023-07-09 DIAGNOSIS — I10 Essential (primary) hypertension: Secondary | ICD-10-CM | POA: Diagnosis not present

## 2023-07-10 ENCOUNTER — Inpatient Hospital Stay (HOSPITAL_COMMUNITY)
Admission: EM | Admit: 2023-07-10 | Discharge: 2023-07-12 | DRG: 193 | Disposition: A | Discharge status: Home / Self Care | Attending: Internal Medicine | Admitting: Internal Medicine

## 2023-07-10 ENCOUNTER — Emergency Department (HOSPITAL_COMMUNITY)

## 2023-07-10 ENCOUNTER — Other Ambulatory Visit: Payer: Self-pay

## 2023-07-10 ENCOUNTER — Encounter (HOSPITAL_COMMUNITY): Payer: Self-pay | Admitting: Emergency Medicine

## 2023-07-10 DIAGNOSIS — Z91048 Other nonmedicinal substance allergy status: Secondary | ICD-10-CM

## 2023-07-10 DIAGNOSIS — Z96642 Presence of left artificial hip joint: Secondary | ICD-10-CM | POA: Diagnosis present

## 2023-07-10 DIAGNOSIS — J9601 Acute respiratory failure with hypoxia: Secondary | ICD-10-CM | POA: Diagnosis not present

## 2023-07-10 DIAGNOSIS — Z8249 Family history of ischemic heart disease and other diseases of the circulatory system: Secondary | ICD-10-CM

## 2023-07-10 DIAGNOSIS — I10 Essential (primary) hypertension: Secondary | ICD-10-CM | POA: Diagnosis present

## 2023-07-10 DIAGNOSIS — R531 Weakness: Secondary | ICD-10-CM | POA: Diagnosis not present

## 2023-07-10 DIAGNOSIS — Z888 Allergy status to other drugs, medicaments and biological substances status: Secondary | ICD-10-CM

## 2023-07-10 DIAGNOSIS — K5900 Constipation, unspecified: Secondary | ICD-10-CM | POA: Diagnosis present

## 2023-07-10 DIAGNOSIS — Z7952 Long term (current) use of systemic steroids: Secondary | ICD-10-CM

## 2023-07-10 DIAGNOSIS — R6510 Systemic inflammatory response syndrome (SIRS) of non-infectious origin without acute organ dysfunction: Secondary | ICD-10-CM | POA: Diagnosis not present

## 2023-07-10 DIAGNOSIS — Z833 Family history of diabetes mellitus: Secondary | ICD-10-CM | POA: Diagnosis not present

## 2023-07-10 DIAGNOSIS — E7800 Pure hypercholesterolemia, unspecified: Secondary | ICD-10-CM | POA: Diagnosis not present

## 2023-07-10 DIAGNOSIS — J101 Influenza due to other identified influenza virus with other respiratory manifestations: Principal | ICD-10-CM

## 2023-07-10 DIAGNOSIS — Z66 Do not resuscitate: Secondary | ICD-10-CM | POA: Diagnosis not present

## 2023-07-10 DIAGNOSIS — Z79899 Other long term (current) drug therapy: Secondary | ICD-10-CM

## 2023-07-10 DIAGNOSIS — J1100 Influenza due to unidentified influenza virus with unspecified type of pneumonia: Secondary | ICD-10-CM | POA: Diagnosis not present

## 2023-07-10 DIAGNOSIS — D72819 Decreased white blood cell count, unspecified: Secondary | ICD-10-CM | POA: Diagnosis present

## 2023-07-10 DIAGNOSIS — F419 Anxiety disorder, unspecified: Secondary | ICD-10-CM | POA: Diagnosis present

## 2023-07-10 DIAGNOSIS — R509 Fever, unspecified: Secondary | ICD-10-CM | POA: Diagnosis not present

## 2023-07-10 DIAGNOSIS — Z8043 Family history of malignant neoplasm of testis: Secondary | ICD-10-CM

## 2023-07-10 DIAGNOSIS — Z96659 Presence of unspecified artificial knee joint: Secondary | ICD-10-CM | POA: Diagnosis not present

## 2023-07-10 DIAGNOSIS — J9811 Atelectasis: Secondary | ICD-10-CM | POA: Diagnosis not present

## 2023-07-10 DIAGNOSIS — Z831 Family history of other infectious and parasitic diseases: Secondary | ICD-10-CM | POA: Diagnosis not present

## 2023-07-10 DIAGNOSIS — E119 Type 2 diabetes mellitus without complications: Secondary | ICD-10-CM | POA: Diagnosis present

## 2023-07-10 DIAGNOSIS — R918 Other nonspecific abnormal finding of lung field: Secondary | ICD-10-CM | POA: Diagnosis not present

## 2023-07-10 DIAGNOSIS — M069 Rheumatoid arthritis, unspecified: Secondary | ICD-10-CM | POA: Diagnosis present

## 2023-07-10 DIAGNOSIS — R0902 Hypoxemia: Secondary | ICD-10-CM | POA: Diagnosis not present

## 2023-07-10 DIAGNOSIS — K219 Gastro-esophageal reflux disease without esophagitis: Secondary | ICD-10-CM | POA: Diagnosis present

## 2023-07-10 DIAGNOSIS — E871 Hypo-osmolality and hyponatremia: Secondary | ICD-10-CM | POA: Diagnosis not present

## 2023-07-10 DIAGNOSIS — Z1152 Encounter for screening for COVID-19: Secondary | ICD-10-CM

## 2023-07-10 DIAGNOSIS — E271 Primary adrenocortical insufficiency: Secondary | ICD-10-CM | POA: Diagnosis not present

## 2023-07-10 DIAGNOSIS — I959 Hypotension, unspecified: Secondary | ICD-10-CM | POA: Diagnosis not present

## 2023-07-10 DIAGNOSIS — Z860100 Personal history of colon polyps, unspecified: Secondary | ICD-10-CM

## 2023-07-10 DIAGNOSIS — J1000 Influenza due to other identified influenza virus with unspecified type of pneumonia: Principal | ICD-10-CM | POA: Diagnosis present

## 2023-07-10 LAB — URINALYSIS, W/ REFLEX TO CULTURE (INFECTION SUSPECTED)
Bacteria, UA: NONE SEEN
Bilirubin Urine: NEGATIVE
Glucose, UA: NEGATIVE mg/dL
Hgb urine dipstick: NEGATIVE
Ketones, ur: 20 mg/dL — AB
Leukocytes,Ua: NEGATIVE
Nitrite: NEGATIVE
Protein, ur: NEGATIVE mg/dL
Specific Gravity, Urine: 1.016 (ref 1.005–1.030)
pH: 5 (ref 5.0–8.0)

## 2023-07-10 LAB — CBC WITH DIFFERENTIAL/PLATELET
Abs Immature Granulocytes: 0.01 10*3/uL (ref 0.00–0.07)
Basophils Absolute: 0 10*3/uL (ref 0.0–0.1)
Basophils Relative: 1 %
Eosinophils Absolute: 0 10*3/uL (ref 0.0–0.5)
Eosinophils Relative: 0 %
HCT: 42.2 % (ref 39.0–52.0)
Hemoglobin: 14.6 g/dL (ref 13.0–17.0)
Immature Granulocytes: 0 %
Lymphocytes Relative: 21 %
Lymphs Abs: 0.5 10*3/uL — ABNORMAL LOW (ref 0.7–4.0)
MCH: 30.7 pg (ref 26.0–34.0)
MCHC: 34.6 g/dL (ref 30.0–36.0)
MCV: 88.7 fL (ref 80.0–100.0)
Monocytes Absolute: 0.4 10*3/uL (ref 0.1–1.0)
Monocytes Relative: 14 %
Neutro Abs: 1.7 10*3/uL (ref 1.7–7.7)
Neutrophils Relative %: 64 %
Platelets: 189 10*3/uL (ref 150–400)
RBC: 4.76 MIL/uL (ref 4.22–5.81)
RDW: 14.1 % (ref 11.5–15.5)
WBC: 2.6 10*3/uL — ABNORMAL LOW (ref 4.0–10.5)
nRBC: 0 % (ref 0.0–0.2)

## 2023-07-10 LAB — COMPREHENSIVE METABOLIC PANEL
ALT: 54 U/L — ABNORMAL HIGH (ref 0–44)
AST: 68 U/L — ABNORMAL HIGH (ref 15–41)
Albumin: 3.6 g/dL (ref 3.5–5.0)
Alkaline Phosphatase: 61 U/L (ref 38–126)
Anion gap: 14 (ref 5–15)
BUN: 14 mg/dL (ref 8–23)
CO2: 19 mmol/L — ABNORMAL LOW (ref 22–32)
Calcium: 8.1 mg/dL — ABNORMAL LOW (ref 8.9–10.3)
Chloride: 97 mmol/L — ABNORMAL LOW (ref 98–111)
Creatinine, Ser: 0.8 mg/dL (ref 0.61–1.24)
GFR, Estimated: >60 mL/min (ref >60)
Glucose, Bld: 87 mg/dL (ref 70–99)
Potassium: 3.4 mmol/L — ABNORMAL LOW (ref 3.5–5.1)
Sodium: 130 mmol/L — ABNORMAL LOW (ref 135–145)
Total Bilirubin: 1.3 mg/dL — ABNORMAL HIGH (ref 0.0–1.2)
Total Protein: 6.5 g/dL (ref 6.5–8.1)

## 2023-07-10 LAB — RESP PANEL BY RT-PCR (RSV, FLU A&B, COVID)  RVPGX2
Influenza A by PCR: POSITIVE — AB
Influenza B by PCR: NEGATIVE
Resp Syncytial Virus by PCR: NEGATIVE
SARS Coronavirus 2 by RT PCR: NEGATIVE

## 2023-07-10 LAB — PROCALCITONIN: Procalcitonin: 0.12 ng/mL

## 2023-07-10 LAB — APTT: aPTT: 37 s — ABNORMAL HIGH (ref 24–36)

## 2023-07-10 LAB — LACTIC ACID, PLASMA: Lactic Acid, Venous: 1 mmol/L (ref 0.5–1.9)

## 2023-07-10 LAB — PROTIME-INR
INR: 1.1 (ref 0.8–1.2)
Prothrombin Time: 14.2 s (ref 11.4–15.2)

## 2023-07-10 LAB — DIGOXIN LEVEL: Digoxin Level: <0.2 ng/mL — ABNORMAL LOW (ref 0.8–2.0)

## 2023-07-10 MED ORDER — IRBESARTAN 150 MG PO TABS
300.0000 mg | ORAL_TABLET | Freq: Every day | ORAL | Status: DC
  Administered 2023-07-11 – 2023-07-12 (×2): 300 mg via ORAL
  Filled 2023-07-10 (×2): qty 2

## 2023-07-10 MED ORDER — ALBUTEROL SULFATE (2.5 MG/3ML) 0.083% IN NEBU: 2.5000 mg | INHALATION_SOLUTION | RESPIRATORY_TRACT | Status: DC | PRN

## 2023-07-10 MED ORDER — GUAIFENESIN-DM 100-10 MG/5ML PO SYRP
15.0000 mL | ORAL_SOLUTION | Freq: Three times a day (TID) | ORAL | Status: AC
  Administered 2023-07-11 (×3): 15 mL via ORAL
  Filled 2023-07-10 (×3): qty 15

## 2023-07-10 MED ORDER — POLYETHYLENE GLYCOL 3350 17 G PO PACK: 17.0000 g | PACK | Freq: Every day | ORAL | Status: DC | PRN

## 2023-07-10 MED ORDER — ACETAMINOPHEN 325 MG PO TABS: 650.0000 mg | ORAL_TABLET | Freq: Four times a day (QID) | ORAL | Status: DC | PRN

## 2023-07-10 MED ORDER — ENOXAPARIN SODIUM 40 MG/0.4ML IJ SOSY
40.0000 mg | PREFILLED_SYRINGE | INTRAMUSCULAR | Status: DC
  Administered 2023-07-11 – 2023-07-12 (×2): 40 mg via SUBCUTANEOUS
  Filled 2023-07-10 (×2): qty 0.4

## 2023-07-10 MED ORDER — ONDANSETRON HCL 4 MG/2ML IJ SOLN
4.0000 mg | Freq: Once | INTRAMUSCULAR | Status: AC
  Administered 2023-07-10: 4 mg via INTRAVENOUS
  Filled 2023-07-10: qty 2

## 2023-07-10 MED ORDER — ALPRAZOLAM 0.5 MG PO TABS
0.5000 mg | ORAL_TABLET | Freq: Every day | ORAL | Status: DC
  Administered 2023-07-11 (×2): 0.5 mg via ORAL
  Filled 2023-07-10 (×2): qty 1

## 2023-07-10 MED ORDER — ACETAMINOPHEN 500 MG PO TABS
1000.0000 mg | ORAL_TABLET | Freq: Once | ORAL | Status: AC
  Administered 2023-07-10: 1000 mg via ORAL
  Filled 2023-07-10: qty 2

## 2023-07-10 MED ORDER — HYDROCORTISONE SOD SUC (PF) 100 MG IJ SOLR
100.0000 mg | Freq: Once | INTRAMUSCULAR | Status: AC
  Administered 2023-07-10: 100 mg via INTRAVENOUS
  Filled 2023-07-10: qty 2

## 2023-07-10 MED ORDER — ACETAMINOPHEN 650 MG RE SUPP: 650.0000 mg | Freq: Four times a day (QID) | RECTAL | Status: DC | PRN

## 2023-07-10 MED ORDER — HYDROCORTISONE SOD SUC (PF) 100 MG IJ SOLR: 40.0000 mg | Freq: Three times a day (TID) | INTRAMUSCULAR | Status: DC

## 2023-07-10 MED ORDER — OSELTAMIVIR PHOSPHATE 75 MG PO CAPS
75.0000 mg | ORAL_CAPSULE | Freq: Once | ORAL | Status: AC
  Administered 2023-07-10: 75 mg via ORAL
  Filled 2023-07-10: qty 1

## 2023-07-10 MED ORDER — AMLODIPINE BESYLATE 5 MG PO TABS
10.0000 mg | ORAL_TABLET | Freq: Every day | ORAL | Status: DC
  Administered 2023-07-11 – 2023-07-12 (×2): 10 mg via ORAL
  Filled 2023-07-10 (×2): qty 2

## 2023-07-10 MED ORDER — DIGOXIN 125 MCG PO TABS
0.0625 mg | ORAL_TABLET | Freq: Every day | ORAL | Status: DC
Start: 2023-07-11 — End: 2023-07-12
  Administered 2023-07-11 – 2023-07-12 (×2): 0.0625 mg via ORAL
  Filled 2023-07-10 (×2): qty 1

## 2023-07-10 MED ORDER — POTASSIUM CHLORIDE IN NACL 20-0.9 MEQ/L-% IV SOLN
INTRAVENOUS | Status: AC
  Filled 2023-07-10: qty 1000

## 2023-07-10 MED ORDER — OSELTAMIVIR PHOSPHATE 75 MG PO CAPS
75.0000 mg | ORAL_CAPSULE | Freq: Two times a day (BID) | ORAL | Status: DC
  Administered 2023-07-11 – 2023-07-12 (×3): 75 mg via ORAL
  Filled 2023-07-10 (×3): qty 1

## 2023-07-10 MED ORDER — SODIUM CHLORIDE 0.9 % IV SOLN
2.0000 g | INTRAVENOUS | Status: DC
  Administered 2023-07-11: 2 g via INTRAVENOUS
  Filled 2023-07-10: qty 20

## 2023-07-10 MED ORDER — HYDROXYCHLOROQUINE SULFATE 200 MG PO TABS
200.0000 mg | ORAL_TABLET | Freq: Every day | ORAL | Status: DC
  Administered 2023-07-12: 200 mg via ORAL
  Filled 2023-07-10 (×3): qty 1

## 2023-07-10 MED ORDER — LACTATED RINGERS IV BOLUS (SEPSIS)
500.0000 mL | Freq: Once | INTRAVENOUS | Status: AC
  Administered 2023-07-10: 500 mL via INTRAVENOUS

## 2023-07-10 MED ORDER — FLUDROCORTISONE ACETATE 0.1 MG PO TABS
0.1000 mg | ORAL_TABLET | Freq: Every day | ORAL | Status: DC
  Administered 2023-07-12: 0.1 mg via ORAL
  Filled 2023-07-10 (×5): qty 1

## 2023-07-10 MED ORDER — KETOROLAC TROMETHAMINE 15 MG/ML IJ SOLN
15.0000 mg | Freq: Once | INTRAMUSCULAR | Status: AC
  Administered 2023-07-10: 15 mg via INTRAVENOUS
  Filled 2023-07-10: qty 1

## 2023-07-10 MED ORDER — HYDROCORTISONE SOD SUC (PF) 100 MG IJ SOLR
40.0000 mg | Freq: Three times a day (TID) | INTRAMUSCULAR | Status: DC
  Administered 2023-07-11 – 2023-07-12 (×4): 40 mg via INTRAVENOUS
  Filled 2023-07-10 (×4): qty 2

## 2023-07-10 MED ORDER — SODIUM CHLORIDE 0.9 % IV SOLN
500.0000 mg | INTRAVENOUS | Status: DC
  Administered 2023-07-11: 500 mg via INTRAVENOUS
  Filled 2023-07-10: qty 5

## 2023-07-10 MED ORDER — ONDANSETRON HCL 4 MG PO TABS
4.0000 mg | ORAL_TABLET | Freq: Four times a day (QID) | ORAL | Status: DC | PRN
Start: 2023-07-10 — End: 2023-07-12

## 2023-07-10 MED ORDER — ONDANSETRON HCL 4 MG/2ML IJ SOLN: 4.0000 mg | Freq: Four times a day (QID) | INTRAMUSCULAR | Status: DC | PRN

## 2023-07-10 NOTE — ED Triage Notes (Signed)
 Pt diagnosed with pneumonia yesterday and c/o generalized weakness today.

## 2023-07-10 NOTE — Assessment & Plan Note (Signed)
 SIRS criteria with fever of 103.3.  And leukopenia of 2.6.  Likely due to viral influenza illness. - hydrate -Treat pneumonia-antibiotics and Tamiflu -Stress dose steroids

## 2023-07-10 NOTE — ED Provider Notes (Signed)
 [place] EMERGENCY DEPARTMENT AT [facility] Provider Note   CSN: [id number] Arrival date & time: 07/10/23  1801     History  Chief Complaint  Patient presents with   Weakness    John Lyons is a 78 y.o. male.  He has past medical history of rheumatoid arthritis on Plaquenil, Addison's disease on fludrocortisone and hydrocortisone, hypertension, high cholesterol and GERD.  He presents the ER today complaining of generalized weakness, cough, body aches and chills.  He states this started 2 days ago.  He works for a [employer] and states everybody at work has had similar symptoms but he felt much worse today.  He went to his primary care doctor in [place] yesterday, and states he had a negative flu and COVID test in the office and they diagnosed him with pneumonia but he is feeling worse today.  He denies COPD, he is not a smoker, he denies history of heart failure or heart disease.  Patient denies abdominal pain, vomiting or diarrhea, he does have some nausea.  Denies dysuria or frequency, no back pain.   Weakness      Home Medications Prior to Admission medications   Medication Sig Start Date End Date Taking? Authorizing Provider  acetaminophen (TYLENOL) 500 MG tablet Take 1,000 mg by mouth every 6 (six) hours as needed (for pain).    [provider]  ALPRAZolam Prudy Feeler) 0.5 MG tablet Take 1 tablet (0.5 mg total) by mouth at bedtime as needed for anxiety or sleep. 02/18/18   Shon Hale, MD  amLODipine (NORVASC) 10 MG tablet Take 1 tablet (10 mg total) by mouth daily. For BP 02/19/18   Emokpae, Courage, MD  Cholecalciferol (VITAMIN D3) 1000 UNITS CAPS Take 1,000 Units by mouth daily.     [provider]  Dextromethorphan-guaiFENesin (MUCINEX DM) 30-600 MG TB12 Take 0.5-1 tablets by mouth 2 (two) times daily as needed (for coughing/congestion).    [provider]  digoxin (LANOXIN) 0.125 MG tablet Take 0.0625 mg by mouth  daily.     [provider]  fludrocortisone (FLORINEF) 0.1 MG tablet Take 0.1 mg by mouth daily.     [provider]  folic acid (FOLVITE) 400 MCG tablet Take 400 mcg by mouth daily.    [provider]  hydrocortisone (CORTEF) 20 MG tablet Take 20 mg by mouth daily.     [provider]  hydroxychloroquine (PLAQUENIL) 200 MG tablet Take 200 mg by mouth daily.    [provider]  lactobacillus acidophilus & bulgar (LACTINEX) chewable tablet Chew 1 tablet by mouth 3 (three) times daily with meals. 02/18/18   Shon Hale, MD  linaclotide (LINZESS) 145 MCG CAPS capsule Take 145 mcg by mouth daily as needed (constipation).    [provider]  Multiple Vitamin (MULTIVITAMIN WITH MINERALS) TABS Take 1 tablet by mouth daily.    [provider]  ondansetron (ZOFRAN) 4 MG tablet Take 1 tablet (4 mg total) by mouth every 6 (six) hours as needed for nausea or vomiting. 02/18/18   Shon Hale, MD  oxyCODONE-acetaminophen (PERCOCET) 5-325 MG tablet Take 1 tablet by mouth every 4 (four) hours as needed for moderate pain or severe pain. 02/18/18   Shon Hale, MD  pantoprazole (PROTONIX) 40 MG tablet Take 40 mg by mouth daily.    [provider]  polyethylene glycol powder (GLYCOLAX/MIRALAX) powder Take 17 g by mouth daily as needed for mild constipation.    [provider]  Potassium Chloride ER  20 MEQ TBCR Take 20 mEq by mouth daily. 1 tab daily by mouth 02/18/18   Shon Hale, MD  pravastatin (PRAVACHOL) 20 MG tablet Take 20 mg by mouth at bedtime.     [provider]  Propylene Glycol-Glycerin (SOOTHE OP) Place 1-2 drops into both eyes 2 (two) times daily as needed (for dryness or irritation).    [provider]  telmisartan (MICARDIS) 80 MG tablet Take 80 mg by mouth daily.    [provider]      Allergies    Adhesive [tape] and Phenobarbital    Review of Systems   Review of  Systems  Neurological:  Positive for weakness.    Physical Exam Updated Vital Signs BP (!) 140/48   Pulse 97   Temp (!) 102.3 F (39.1 C) (Oral)   Resp 18   Ht 5\' 3"  (1.6 m)   Wt 58.7 kg   SpO2 95%   BMI 22.92 kg/m  Physical Exam Vitals and nursing note reviewed.  Constitutional:      General: He is not in acute distress.    Appearance: He is well-developed.  HENT:     Head: Normocephalic and atraumatic.     Nose: Nose normal.     Mouth/Throat:     Mouth: Mucous membranes are moist.  Eyes:     Conjunctiva/sclera: Conjunctivae normal.  Cardiovascular:     Rate and Rhythm: Normal rate and regular rhythm.     Heart sounds: No murmur heard. Pulmonary:     Effort: Pulmonary effort is normal. No respiratory distress.     Breath sounds: Normal breath sounds.  Abdominal:     Palpations: Abdomen is soft.     Tenderness: There is no abdominal tenderness.  Musculoskeletal:        General: No swelling.     Cervical back: Neck supple.  Skin:    General: Skin is warm and dry.     Capillary Refill: Capillary refill takes less than 2 seconds.  Neurological:     General: No focal deficit present.     Mental Status: He is alert and oriented to person, place, and time.  Psychiatric:        Mood and Affect: Mood normal.     ED Results / Procedures / Treatments   Labs (all labs ordered are listed, but only abnormal results are displayed) Labs Reviewed  RESP PANEL BY RT-PCR (RSV, FLU A&B, COVID)  RVPGX2 - Abnormal; Notable for the following components:      Result Value   Influenza A by PCR POSITIVE (*)    All other components within normal limits  COMPREHENSIVE METABOLIC PANEL - Abnormal; Notable for the following components:   Sodium 130 (*)    Potassium 3.4 (*)    Chloride 97 (*)    CO2 19 (*)    Calcium 8.1 (*)    AST 68 (*)    ALT 54 (*)    Total Bilirubin 1.3 (*)    All other components within normal limits  CBC WITH DIFFERENTIAL/PLATELET - Abnormal; Notable for  the following components:   WBC 2.6 (*)    Lymphs Abs 0.5 (*)    All other components within normal limits  APTT - Abnormal; Notable for the following components:   aPTT 37 (*)    All other components within normal limits  URINALYSIS, W/ REFLEX TO CULTURE (INFECTION SUSPECTED) - Abnormal; Notable for the following components:   Ketones, ur 20 (*)  All other components within normal limits  DIGOXIN LEVEL - Abnormal; Notable for the following components:   Digoxin Level <0.2 (*)    All other components within normal limits  CULTURE, BLOOD (ROUTINE X 2)  CULTURE, BLOOD (ROUTINE X 2)  LACTIC ACID, PLASMA  PROTIME-INR  PROCALCITONIN    EKG None  Radiology DG Chest Port 1 View Result Date: 07/10/2023 CLINICAL DATA:  Questionable sepsis. Diagnosed with pneumonia yesterday. Generalized weakness today. EXAM: PORTABLE CHEST 1 VIEW COMPARISON:  02/15/2018 FINDINGS: Stable cardiomediastinal silhouette. Diffuse interstitial coarsening and bronchial wall thickening. Left basilar atelectasis or infiltrates. No pleural effusion or pneumothorax. No displaced rib fractures. Advanced arthritis both shoulders. IMPRESSION: Left basilar atelectasis or infiltrates. Interstitial coarsening may be due to edema or infection. Electronically Signed   By: Minerva Fester M.D.   On: 07/10/2023 20:29    Procedures Procedures    Medications Ordered in ED Medications  fludrocortisone (FLORINEF) tablet 0.1 mg (has no administration in time range)  lactated ringers bolus 500 mL (0 mLs Intravenous Stopped 07/10/23 1949)  acetaminophen (TYLENOL) tablet 1,000 mg (1,000 mg Oral Given 07/10/23 1846)  oseltamivir (TAMIFLU) capsule 75 mg (75 mg Oral Given 07/10/23 2129)  ondansetron (ZOFRAN) injection 4 mg (4 mg Intravenous Given 07/10/23 2129)  ketorolac (TORADOL) 15 MG/ML injection 15 mg (15 mg Intravenous Given 07/10/23 2129)  hydrocortisone sodium succinate (SOLU-CORTEF) 100 MG injection 100 mg (100 mg Intravenous  Given 07/10/23 2128)    ED Course/ Medical Decision Making/ A&P Clinical Course as of 07/10/23 2210  Tue Jul 10, 2023  2125 Daughter at bedside now, states he has only taken Levaquin and [employer] today, did not take his daily medicines otherwise including his steroids.  Ordered IV Solu-Cortef as blood pressure is now somewhat soft.  Also given Toradol for continued fever and pain [CB]    Clinical Course User Index [CB] Ma Rings, PA-C                                 Medical Decision Making This patient presents to the ED for concern of EVAR, chills, body aches, generalized weakness and cough, this involves an extensive number of treatment options, and is a complaint that carries with it a high risk of complications and morbidity.  The differential diagnosis includes Monia, sepsis, influenza, COVID-19, less likely PE, pneumothorax, other   Co morbidities that complicate the patient evaluation :   Addison's disease, rheumatoid arthritis   Additional history obtained:  Additional history obtained from EMR External records from outside source obtained and reviewed including med list, prior notes, prior labs   Lab Tests:  I Ordered, and personally interpreted labs.  The pertinent results include: Positive for flu A, UA is normal, lactic acid normal, CMP shows mild hyponatremia, mild hypokalemia and hypochloremia, CO2 decreased at 19, normal anion gap, normal renal function, very slight elevation in AST and ALT and bilirubin, CBC shows no anemia but WBC is decreased at 2.6   Imaging Studies ordered:  I ordered imaging studies including x-ray which shows basilar atelectasis or infiltrates, interstitial changes I independently visualized and interpreted imaging within scope of identifying emergent findings  I agree with the radiologist interpretation   Cardiac Monitoring: / EKG:  The patient was maintained on a cardiac monitor.  I personally viewed and interpreted the  cardiac monitored which showed an underlying rhythm of: Sinus rhythm   Consultations Obtained:  I requested  consultation with the hospitalist Dr. Mariea Clonts,  and discussed lab and imaging findings as well as pertinent plan - they recommend: [place]   Problem List / ED Course / Critical interventions / Medication management  Patient presents with generalized weakness, fever, fatigue, body aches.  Symptoms present x 2 days with sick contacts.  Prescribed Levaquin by PCP yesterday for possible pneumonia given negative in office COVID and flu testing.  Did take Levaquin this morning.  He did not take any of his other medications.  Noted to be febrile and slightly hypoxic on arrival, requiring 2 L nasal cannula, patient given Tylenol which was somewhat improved fever, chest x-ray shows interstitial changes and there is basilar infiltrate versus atelectasis.  Patient positive for flu A likely the cause of his symptoms today, he was given IV fluids, given his home cortisone and IV hydrocortisone as he did not take his medications today.  Discussed with hospitalist for admission as above, patient is agreeable with this. I ordered medication including tylenol  for fever  Reevaluation of the patient after these medicines showed that the patient improved I have reviewed the patients home medicines and have made adjustments as needed      Amount and/or Complexity of Data Reviewed Labs: ordered. Radiology: ordered.  Risk OTC drugs. Prescription drug management. Decision regarding hospitalization.           Final Clinical Impression(s) / ED Diagnoses Final diagnoses:  Influenza A    Rx / DC Orders ED Discharge Orders     None         Ma Rings, PA-C 07/10/23 2210    Anders Simmonds T, DO 07/12/23 2324

## 2023-07-10 NOTE — Assessment & Plan Note (Signed)
 Stable. -Resume telmisartan, amlodipine

## 2023-07-10 NOTE — Assessment & Plan Note (Signed)
 Sodium 130. - Hydrate N/s + 20 KCL 100cc X 15hrs

## 2023-07-10 NOTE — Assessment & Plan Note (Addendum)
 On Solu-Cortef and Florinef.  Blood pressure stable. -Considering high fevers, SIRs, respiratory failure and influenza pneumonia, will start stress dose steroids with IV Solu-Cortef 40 3 times daily.  100 mg Solu-Cortef given in ED. - Resume Florinef

## 2023-07-10 NOTE — Assessment & Plan Note (Signed)
Stable. ?-Resume Plaquenil ?

## 2023-07-10 NOTE — Assessment & Plan Note (Signed)
 2 days of symptoms, febrile to 103.3.  Chest x-ray with left base atelectasis or infiltrate.  Influenza pneumonia with hypoxia.   -IV ceftriaxone and azithromycin -Check procalcitonin, low threshold to discontinue antibiotics -Hydrate -Tamiflu -Mucolytics, albuterol nebs as needed

## 2023-07-10 NOTE — Assessment & Plan Note (Addendum)
 O2 sats down to 89 % on room air, now 2 L.  Tachypneic respirate rate 18-30.  Likely secondary to influenza pneumonia.  Lung exam clear.  Never smoked cigarettes.

## 2023-07-10 NOTE — H&P (Signed)
 History and Physical    John Lyons ZOX:[id number] DOB: 1945-10-22 DOA: 07/10/2023  PCP: Rodrigo Ran, MD   Patient coming from: Home  I have personally briefly reviewed patient's old medical records in [facility]  Chief Complaint: Cough, fever  HPI: John Lyons is a 78 y.o. male with medical history significant for diabetes, hypertension, rheumatoid arthritis, Addison's disease. Patient presented to the ED with complaints of generalized weakness, cough and bodyaches with fevers and chills over the past 2 days.  He went to see his outpatient provider yesterday was diagnosed with pneumonia and started on levofloxacin.  Last night he started feeling worse. No chest pain or difficulty breathing.  He reports nausea without vomiting.  No diarrhea.  No abdominal pain.  Never smoked cigarettes.  ED Course: Febrile to 103.3.  Heart rate 88-97.  Respirate rate 18-30.  Blood pressure systolic 140-162.  O2 sats down to 89% on room air. Influenza positive.  Chest x-ray shows left base atelectasis or infiltrate, interstitial coarsening, edema or infection.  Review of Systems: As per HPI all other systems reviewed and negative.  Past Medical History:  Diagnosis Date   Addison's disease (HCC)    takes Florinef and Cortef daily   Anxiety    takes Xanax at bedtime   Constipation    takes Linzess daily and Miralax as needed as well as Colace   Diverticulosis    Dry skin    on legs   Dysrhythmia    takes Digoxin daily   GERD (gastroesophageal reflux disease)    takes Pantoprazole daily   H/O hiatal hernia    History of blood transfusion    History of colon polyps    benign   Hyperlipidemia    takes Pravastatin daily   Hypertension    takes Amlodipine and Micardis daily   Joint pain    Joint swelling    PONV (postoperative nausea and vomiting)    Rheumatoid arthritis(714.0)    Rheumatoid-takes Reclast yearly and Plaquenil daily   Weakness    right hand.Knot on right  hand,states came up 2 wks ago,=    Past Surgical History:  Procedure Laterality Date   CARDIOVASCULAR STRESS TEST  01/04/12   Dr. Allyson Sabal [facility]   CHOLECYSTECTOMY N/A 08/12/2015   Procedure: LAPAROSCOPIC CHOLECYSTECTOMY;  Surgeon: Rodman Pickle, MD;  Location: [facility] OR;  Service: General;  Laterality: N/A;   COLONOSCOPY     COLONOSCOPY WITH ESOPHAGOGASTRODUODENOSCOPY (EGD) AND ESOPHAGEAL DILATION (ED)     EYE SURGERY     cataracts   SINUS ENDO WITH FUSION Bilateral 01/01/2018   Procedure: BILATERAL TURBINATE REDUCTION, BILATERAL TOTAL ENDOSCOPIC ETHOMIDECTOMY AND BILATERAL ENDOSCOPIC MAXILLARY ANTROSTOMY WITH TISSUE REMOVAL WITH FUSION;  Surgeon: Newman Pies, MD;  Location: [facility];  Service: ENT;  Laterality: Bilateral;   TOTAL HIP ARTHROPLASTY  1978/1979   bilat   TOTAL HIP ARTHROPLASTY  11/01/2015   REVISION OF LEFT TOTAL HIP ARTHROPLASTY WITH BOTH ACETABULAR AND FEMORAL COMPONENTS POSTERIOR APPROACH (Left) - Requesting RNFA   TOTAL HIP REVISION Left 11/01/2015   Procedure: REVISION OF LEFT TOTAL HIP ARTHROPLASTY WITH BOTH ACETABULAR AND FEMORAL COMPONENTS POSTERIOR APPROACH;  Surgeon: Samson Frederic, MD;  Location: [facility] OR;  Service: Orthopedics;  Laterality: Left;  Requesting RNFA   TOTAL KNEE ARTHROPLASTY  1984   Left    TOTAL KNEE ARTHROPLASTY  02/12/2012   right   TOTAL KNEE ARTHROPLASTY  02/12/2012   Procedure: TOTAL KNEE ARTHROPLASTY;  Surgeon: Jeannett Senior  Anola Gurney, MD;  Location: [facility] OR;  Service: Orthopedics;  Laterality: Right;   TURBINATE REDUCTION Bilateral 01/01/2018   Procedure: BILATERAL TURBINATE REDUCTION;  Surgeon: Newman Pies, MD;  Location: [facility];  Service: ENT;  Laterality: Bilateral;   VASECTOMY  1979     reports that he has never smoked. He has never used smokeless tobacco. He reports that he does not drink alcohol and does not use drugs.  Allergies  Allergen Reactions   Adhesive [Tape] Rash   Phenobarbital Rash     Family History  Problem Relation Age of Onset   Diabetes Mother    Heart disease Mother    Testicular cancer Father    Rheumatic fever Brother     Prior to Admission medications   Medication Sig Start Date End Date Taking? Authorizing Provider  acetaminophen (TYLENOL) 500 MG tablet Take 1,000 mg by mouth every 6 (six) hours as needed (for pain).    [provider]  ALPRAZolam Prudy Feeler) 0.5 MG tablet Take 1 tablet (0.5 mg total) by mouth at bedtime as needed for anxiety or sleep. 02/18/18   Shon Hale, MD  amLODipine (NORVASC) 10 MG tablet Take 1 tablet (10 mg total) by mouth daily. For BP 02/19/18   Naythen Heikkila, Courage, MD  Cholecalciferol (VITAMIN D3) 1000 UNITS CAPS Take 1,000 Units by mouth daily.     [provider]  Dextromethorphan-guaiFENesin (MUCINEX DM) 30-600 MG TB12 Take 0.5-1 tablets by mouth 2 (two) times daily as needed (for coughing/congestion).    [provider]  digoxin (LANOXIN) 0.125 MG tablet Take 0.0625 mg by mouth daily.     [provider]  fludrocortisone (FLORINEF) 0.1 MG tablet Take 0.1 mg by mouth daily.     [provider]  folic acid (FOLVITE) 400 MCG tablet Take 400 mcg by mouth daily.    [provider]  hydrocortisone (CORTEF) 20 MG tablet Take 20 mg by mouth daily.     [provider]  hydroxychloroquine (PLAQUENIL) 200 MG tablet Take 200 mg by mouth daily.    [provider]  lactobacillus acidophilus & bulgar (LACTINEX) chewable tablet Chew 1 tablet by mouth 3 (three) times daily with meals. 02/18/18   Shon Hale, MD  linaclotide (LINZESS) 145 MCG CAPS capsule Take 145 mcg by mouth daily as needed (constipation).    [provider]  Multiple Vitamin (MULTIVITAMIN WITH MINERALS) TABS Take 1 tablet by mouth daily.    [provider]  ondansetron (ZOFRAN) 4 MG tablet Take 1 tablet (4 mg total) by mouth every 6 (six) hours as needed for nausea or vomiting.  02/18/18   Shon Hale, MD  oxyCODONE-acetaminophen (PERCOCET) 5-325 MG tablet Take 1 tablet by mouth every 4 (four) hours as needed for moderate pain or severe pain. 02/18/18   Shon Hale, MD  pantoprazole (PROTONIX) 40 MG tablet Take 40 mg by mouth daily.    [provider]  polyethylene glycol powder (GLYCOLAX/MIRALAX) powder Take 17 g by mouth daily as needed for mild constipation.    [provider]  Potassium Chloride ER 20 MEQ TBCR Take 20 mEq by mouth daily. 1 tab daily by mouth 02/18/18   Shon Hale, MD  pravastatin (PRAVACHOL) 20 MG tablet Take 20 mg by mouth at bedtime.     [provider]  Propylene Glycol-Glycerin (SOOTHE OP) Place 1-2 drops into both eyes 2 (two) times daily as needed (for dryness or irritation).    [provider]  telmisartan (MICARDIS)  80 MG tablet Take 80 mg by mouth daily.    [provider]    Physical Exam: Vitals:   07/10/23 1915 07/10/23 1930 07/10/23 1945 07/10/23 1949  BP: (!) 140/48     Pulse: 93 94 97   Resp:   18   Temp:    (!) 102.3 F (39.1 C)  TempSrc:    Oral  SpO2: 97% 95% 95%   Weight:      Height:        Constitutional: NAD, calm, comfortable Vitals:   07/10/23 1915 07/10/23 1930 07/10/23 1945 07/10/23 1949  BP: (!) 140/48     Pulse: 93 94 97   Resp:   18   Temp:    (!) 102.3 F (39.1 C)  TempSrc:    Oral  SpO2: 97% 95% 95%   Weight:      Height:       Eyes: PERRL, lids and conjunctivae normal ENMT: Mucous membranes are mildly dry.  Neck: normal, supple, no masses, no thyromegaly Respiratory: clear to auscultation bilaterally, no wheezing, no crackles. Normal respiratory effort. No accessory muscle use.   Cardiovascular: Regular rate and rhythm, no murmurs / rubs / gallops. Trace pitting bilateral lower extremity edema.  Extremities warm.  Chronic unchanged discoloration to bilateral lower extremities..  Abdomen: no tenderness, no masses palpated. No  hepatosplenomegaly. Bowel sounds positive.  Musculoskeletal: no clubbing / cyanosis. No joint deformity upper and lower extremities. Skin: no rashes, lesions, ulcers. No induration Neurologic:  No facial symmetry, moving extremities spontaneously, speech fluent.Marland Kitchen  Psychiatric: Normal judgment and insight. Alert and oriented x 3. Normal mood.   Labs on Admission: I have personally reviewed following labs and imaging studies  CBC: Recent Labs  Lab 07/10/23 1843  WBC 2.6*  NEUTROABS 1.7  HGB 14.6  HCT 42.2  MCV 88.7  PLT 189   Basic Metabolic Panel: Recent Labs  Lab 07/10/23 1843  NA 130*  K 3.4*  CL 97*  CO2 19*  GLUCOSE 87  BUN 14  CREATININE 0.80  CALCIUM 8.1*   Liver Function Tests: Recent Labs  Lab 07/10/23 1843  AST 68*  ALT 54*  ALKPHOS 61  BILITOT 1.3*  PROT 6.5  ALBUMIN 3.6   Coagulation Profile: Recent Labs  Lab 07/10/23 1843  INR 1.1   Urine analysis:    Component Value Date/Time   COLORURINE YELLOW 07/10/2023 2034   APPEARANCEUR CLEAR 07/10/2023 2034   LABSPEC 1.016 07/10/2023 2034   PHURINE 5.0 07/10/2023 2034   GLUCOSEU NEGATIVE 07/10/2023 2034   HGBUR NEGATIVE 07/10/2023 2034   BILIRUBINUR NEGATIVE 07/10/2023 2034   KETONESUR 20 (A) 07/10/2023 2034   PROTEINUR NEGATIVE 07/10/2023 2034   UROBILINOGEN 0.2 02/02/2012 1135   NITRITE NEGATIVE 07/10/2023 2034   LEUKOCYTESUR NEGATIVE 07/10/2023 2034    Radiological Exams on Admission: DG Chest Port 1 View Result Date: 07/10/2023 CLINICAL DATA:  Questionable sepsis. Diagnosed with pneumonia yesterday. Generalized weakness today. EXAM: PORTABLE CHEST 1 VIEW COMPARISON:  02/15/2018 FINDINGS: Stable cardiomediastinal silhouette. Diffuse interstitial coarsening and bronchial wall thickening. Left basilar atelectasis or infiltrates. No pleural effusion or pneumothorax. No displaced rib fractures. Advanced arthritis both shoulders. IMPRESSION: Left basilar atelectasis or infiltrates. Interstitial  coarsening may be due to edema or infection. Electronically Signed   By: Minerva Fester M.D.   On: 07/10/2023 20:29    EKG: Independently reviewed.  Sinus rhythm, rate 91, QTc 410.  No significant change from prior.  Assessment/Plan Principal Problem:   Influenza and  pneumonia Active Problems:   Hyponatremia   Acute hypoxic respiratory failure (HCC)   SIRS (systemic inflammatory response syndrome) (HCC)   Addison's disease (HCC)   Rheumatoid arthritis (HCC)   Hypertension  Assessment and Plan: * Influenza and pneumonia 2 days of symptoms, febrile to 103.3.  Chest x-ray with left base atelectasis or infiltrate.  Influenza pneumonia with hypoxia.   -IV ceftriaxone and azithromycin -Check procalcitonin, low threshold to discontinue antibiotics -Hydrate -Tamiflu -Mucolytics, albuterol nebs as needed  SIRS (systemic inflammatory response syndrome) (HCC) SIRS criteria with fever of 103.3.  And leukopenia of 2.6.  Likely due to viral influenza illness. - hydrate -Treat pneumonia-antibiotics and Tamiflu -Stress dose steroids  Acute hypoxic respiratory failure (HCC) O2 sats down to 89 % on room air, now 2 L.  Tachypneic respirate rate 18-30.  Likely secondary to influenza pneumonia.  Lung exam clear.  Never smoked cigarettes.  Hyponatremia Sodium 130. - Hydrate N/s + 20 KCL 100cc X 15hrs  Hypertension Stable. -Resume telmisartan, amlodipine  Rheumatoid arthritis (HCC) Stable. -Resume Plaquenil  Addison's disease (HCC) On Solu-Cortef and Florinef.  Blood pressure stable. -Considering high fevers, SIRs, respiratory failure and influenza pneumonia, will start stress dose steroids with IV Solu-Cortef 40 3 times daily.  100 mg Solu-Cortef given in ED. - Resume Florinef   DVT prophylaxis: Lovenox Code Status: DNR-confirmed with patient, and daughter Asher Muir at bedside Family Communication: Daughter Asher Muir at bedside Disposition Plan: ~2 days Consults called: None  Admission  status:  Inpt tele I certify that at the point of admission it is my clinical judgment that the patient will require inpatient hospital care spanning beyond 2 midnights from the point of admission due to high intensity of service, high risk for further deterioration and high frequency of surveillance required.  Author: Onnie Boer, MD 07/10/2023 11:21 PM  For on call review [email or web address].

## 2023-07-11 DIAGNOSIS — J1100 Influenza due to unidentified influenza virus with unspecified type of pneumonia: Secondary | ICD-10-CM | POA: Diagnosis not present

## 2023-07-11 LAB — BASIC METABOLIC PANEL
Anion gap: 10 (ref 5–15)
BUN: 19 mg/dL (ref 8–23)
CO2: 19 mmol/L — ABNORMAL LOW (ref 22–32)
Calcium: 7.4 mg/dL — ABNORMAL LOW (ref 8.9–10.3)
Chloride: 103 mmol/L (ref 98–111)
Creatinine, Ser: 0.69 mg/dL (ref 0.61–1.24)
GFR, Estimated: >60 mL/min (ref >60)
Glucose, Bld: 102 mg/dL — ABNORMAL HIGH (ref 70–99)
Potassium: 3.5 mmol/L (ref 3.5–5.1)
Sodium: 132 mmol/L — ABNORMAL LOW (ref 135–145)

## 2023-07-11 LAB — CBC
HCT: 42.7 % (ref 39.0–52.0)
Hemoglobin: 14.5 g/dL (ref 13.0–17.0)
MCH: 30.7 pg (ref 26.0–34.0)
MCHC: 34 g/dL (ref 30.0–36.0)
MCV: 90.3 fL (ref 80.0–100.0)
Platelets: 142 10*3/uL — ABNORMAL LOW (ref 150–400)
RBC: 4.73 MIL/uL (ref 4.22–5.81)
RDW: 13.9 % (ref 11.5–15.5)
WBC: 2.4 10*3/uL — ABNORMAL LOW (ref 4.0–10.5)
nRBC: 0 % (ref 0.0–0.2)

## 2023-07-11 NOTE — Progress Notes (Signed)
 PROGRESS NOTE    John Lyons  ZOX:[id number] DOB: [birth year] DOA: 07/10/2023 PCP: Rodrigo Ran, MD   Brief Narrative:     John Lyons is a 78 y.o. male with medical history significant for diabetes, hypertension, rheumatoid arthritis, Addison's disease. Patient presented to the ED with complaints of generalized weakness, cough and bodyaches with fevers and chills over the past 2 days.  He went to see his outpatient provider yesterday was diagnosed with pneumonia and started on levofloxacin.  Last night he started feeling worse.  Assessment & Plan:   Principal Problem:   Influenza and pneumonia Active Problems:   Hyponatremia   Acute hypoxic respiratory failure (HCC)   SIRS (systemic inflammatory response syndrome) (HCC)   Addison's disease (HCC)   Rheumatoid arthritis (HCC)   Hypertension  Assessment and Plan:    Influenza and pneumonia 2 days of symptoms, febrile to 103.3.  Chest x-ray with left base atelectasis or infiltrate.  Influenza pneumonia with hypoxia.   -IV ceftriaxone and azithromycin to discontinue as procalcitonin is low -Hydrate -Tamiflu -Mucolytics, albuterol nebs as needed   SIRS (systemic inflammatory response syndrome) (HCC) SIRS criteria with fever of 103.3.  And leukopenia of 2.6.  Likely due to viral influenza illness. - hydrate -Treat pneumonia-continue Tamiflu -Stress dose steroids   Acute hypoxic respiratory failure (HCC) O2 sats down to 89 % on room air, now 2 L.  Tachypneic respirate rate 18-30.  Likely secondary to influenza pneumonia.  Lung exam clear.  Never smoked cigarettes.   Hyponatremia-improving -Continue to monitor in a.m. labs   Hypertension Stable. -Resume telmisartan, amlodipine   Rheumatoid arthritis (HCC) Stable. -Resume Plaquenil   Addison's disease (HCC) On Solu-Cortef and Florinef.  Blood pressure stable. -Considering high fevers, SIRs, respiratory failure and influenza pneumonia, will start stress dose  steroids with IV Solu-Cortef 40 3 times daily.  100 mg Solu-Cortef given in ED. - Resume Florinef    DVT prophylaxis:Lovenox Code Status: Full Family Communication: Daughter at bedside 3/19 Disposition Plan:  Status is: Inpatient Remains inpatient appropriate because: Need for IV medications.   Consultants:  None  Procedures:  None  Antimicrobials:  Anti-infectives (From admission, onward)    Start     Dose/Rate Route Frequency Ordered Stop   07/11/23 1000  hydroxychloroquine (PLAQUENIL) tablet 200 mg        200 mg Oral Daily 07/10/23 2304     07/11/23 1000  oseltamivir (TAMIFLU) capsule 75 mg       Note to Pharmacy: Pls adjust dose as indicated, thanks.   75 mg Oral 2 times daily 07/10/23 2304 07/16/23 0959   07/10/23 2315  cefTRIAXone (ROCEPHIN) 2 g in sodium chloride 0.9 % 100 mL IVPB  Status:  Discontinued        2 g 200 mL/hr over 30 Minutes Intravenous Every 24 hours 07/10/23 2304 07/11/23 0748   07/10/23 2315  azithromycin (ZITHROMAX) 500 mg in sodium chloride 0.9 % 250 mL IVPB  Status:  Discontinued        500 mg 250 mL/hr over 60 Minutes Intravenous Every 24 hours 07/10/23 2304 07/11/23 0748   07/10/23 2045  oseltamivir (TAMIFLU) capsule 75 mg        75 mg Oral  Once 07/10/23 2034 07/10/23 2129      Subjective: Patient seen and evaluated today with no new acute complaints or concerns. No acute concerns or events noted overnight.  He is overall starting to feel better.  Still remains on 2 L nasal cannula.  Objective:  Vitals:   07/11/23 0930 07/11/23 0936 07/11/23 1100 07/11/23 1222  BP: (!) 152/70  139/67 (!) 158/74  Pulse: 70 70 75 85  Resp: 17 15 14 18   Temp:      TempSrc:      SpO2: 100% 100% 99% 100%  Weight:      Height:        Intake/Output Summary (Last 24 hours) at 07/11/2023 1535 Last data filed at 07/11/2023 1532 Gross per 24 hour  Intake 1000 ml  Output 800 ml  Net 200 ml   Filed Weights   07/10/23 1807  Weight: 58.7 kg     Examination:  General exam: Appears calm and comfortable  Respiratory system: Clear to auscultation. Respiratory effort normal.  2 L nasal cannula Cardiovascular system: S1 & S2 heard, RRR.  Gastrointestinal system: Abdomen is soft Central nervous system: Alert and awake Extremities: No edema Skin: No significant lesions noted Psychiatry: Flat affect.    Data Reviewed: I have personally reviewed following labs and imaging studies  CBC: Recent Labs  Lab 07/10/23 1843 07/11/23 0542  WBC 2.6* 2.4*  NEUTROABS 1.7  --   HGB 14.6 14.5  HCT 42.2 42.7  MCV 88.7 90.3  PLT 189 142*   Basic Metabolic Panel: Recent Labs  Lab 07/10/23 1843 07/11/23 0542  NA 130* 132*  K 3.4* 3.5  CL 97* 103  CO2 19* 19*  GLUCOSE 87 102*  BUN 14 19  CREATININE 0.80 0.69  CALCIUM 8.1* 7.4*   GFR: Estimated Creatinine Clearance: 60.3 mL/min (by C-G formula based on SCr of 0.69 mg/dL). Liver Function Tests: Recent Labs  Lab 07/10/23 1843  AST 68*  ALT 54*  ALKPHOS 61  BILITOT 1.3*  PROT 6.5  ALBUMIN 3.6   No results for input(s): "LIPASE", "AMYLASE" in the last 168 hours. No results for input(s): "AMMONIA" in the last 168 hours. Coagulation Profile: Recent Labs  Lab 07/10/23 1843  INR 1.1   Cardiac Enzymes: No results for input(s): "CKTOTAL", "CKMB", "CKMBINDEX", "TROPONINI" in the last 168 hours. BNP (last 3 results) No results for input(s): "PROBNP" in the last 8760 hours. HbA1C: No results for input(s): "HGBA1C" in the last 72 hours. CBG: No results for input(s): "GLUCAP" in the last 168 hours. Lipid Profile: No results for input(s): "CHOL", "HDL", "LDLCALC", "TRIG", "CHOLHDL", "LDLDIRECT" in the last 72 hours. Thyroid Function Tests: No results for input(s): "TSH", "T4TOTAL", "FREET4", "T3FREE", "THYROIDAB" in the last 72 hours. Anemia Panel: No results for input(s): "VITAMINB12", "FOLATE", "FERRITIN", "TIBC", "IRON", "RETICCTPCT" in the last 72 hours. Sepsis  Labs: Recent Labs  Lab 07/10/23 1843  PROCALCITON 0.12  LATICACIDVEN 1.0    Recent Results (from the past 240 hours)  Blood Culture (routine x 2)     Status: None (Preliminary result)   Collection Time: 07/10/23  6:45 PM   Specimen: Left Antecubital; Blood  Result Value Ref Range Status   Specimen Description   Final    LEFT ANTECUBITAL BOTTLES DRAWN AEROBIC AND ANAEROBIC   Special Requests Blood Culture adequate volume  Final   Culture   Final    NO GROWTH < 24 HOURS Performed at [facility], [address] [place], [place] [address]    Report Status PENDING  Incomplete  Blood Culture (routine x 2)     Status: None (Preliminary result)   Collection Time: 07/10/23  6:45 PM   Specimen: BLOOD LEFT FOREARM  Result Value Ref Range Status   Specimen Description   Final  BLOOD LEFT FOREARM BOTTLES DRAWN AEROBIC AND ANAEROBIC   Special Requests Blood Culture adequate volume  Final   Culture   Final    NO GROWTH < 12 HOURS Performed at [facility], [address] [place], [place] [address]    Report Status PENDING  Incomplete  Resp panel by RT-PCR (RSV, Flu A&B, Covid) Anterior Nasal Swab     Status: Abnormal   Collection Time: 07/10/23  6:50 PM   Specimen: Anterior Nasal Swab  Result Value Ref Range Status   SARS Coronavirus 2 by RT PCR NEGATIVE NEGATIVE Final    Comment: (NOTE) SARS-CoV-2 target nucleic acids are NOT DETECTED.  The SARS-CoV-2 RNA is generally detectable in upper respiratory specimens during the acute phase of infection. The lowest concentration of SARS-CoV-2 viral copies this assay can detect is 138 copies/mL. A negative result does not preclude SARS-Cov-2 infection and should not be used as the sole basis for treatment or other patient management decisions. A negative result may occur with  improper specimen collection/handling, submission of specimen other than nasopharyngeal swab, presence of viral mutation(s) within the areas targeted by this assay,  and inadequate number of viral copies(<138 copies/mL). A negative result must be combined with clinical observations, patient history, and epidemiological information. The expected result is Negative.  Fact Sheet for Patients:  BloggerCourse.com  Fact Sheet for Healthcare Providers:  SeriousBroker.it  This test is no t yet approved or cleared by the [place] FDA and  has been authorized for detection and/or diagnosis of SARS-CoV-2 by FDA under an Emergency Use Authorization (EUA). This EUA will remain  in effect (meaning this test can be used) for the duration of the COVID-19 declaration under Section 564(b)(1) of the Act, 21 U.S.C.section 360bbb-3(b)(1), unless the authorization is terminated  or revoked sooner.       Influenza A by PCR POSITIVE (A) NEGATIVE Final   Influenza B by PCR NEGATIVE NEGATIVE Final    Comment: (NOTE) The Xpert Xpress SARS-CoV-2/FLU/RSV plus assay is intended as an aid in the diagnosis of influenza from Nasopharyngeal swab specimens and should not be used as a sole basis for treatment. Nasal washings and aspirates are unacceptable for Xpert Xpress SARS-CoV-2/FLU/RSV testing.  Fact Sheet for Patients: BloggerCourse.com  Fact Sheet for Healthcare Providers: SeriousBroker.it  This test is not yet approved or cleared by the [place] FDA and has been authorized for detection and/or diagnosis of SARS-CoV-2 by FDA under an Emergency Use Authorization (EUA). This EUA will remain in effect (meaning this test can be used) for the duration of the COVID-19 declaration under Section 564(b)(1) of the Act, 21 U.S.C. section 360bbb-3(b)(1), unless the authorization is terminated or revoked.     Resp Syncytial Virus by PCR NEGATIVE NEGATIVE Final    Comment: (NOTE) Fact Sheet for Patients: BloggerCourse.com  Fact Sheet for  Healthcare Providers: SeriousBroker.it  This test is not yet approved or cleared by the [place] FDA and has been authorized for detection and/or diagnosis of SARS-CoV-2 by FDA under an Emergency Use Authorization (EUA). This EUA will remain in effect (meaning this test can be used) for the duration of the COVID-19 declaration under Section 564(b)(1) of the Act, 21 U.S.C. section 360bbb-3(b)(1), unless the authorization is terminated or revoked.  Performed at [facility], [address] [place], [place] [address]          Radiology Studies: [facility] 1 View Result Date: 07/10/2023 CLINICAL DATA:  Questionable sepsis. Diagnosed with pneumonia yesterday. Generalized weakness  today. EXAM: PORTABLE CHEST 1 VIEW COMPARISON:  02/15/2018 FINDINGS: Stable cardiomediastinal silhouette. Diffuse interstitial coarsening and bronchial wall thickening. Left basilar atelectasis or infiltrates. No pleural effusion or pneumothorax. No displaced rib fractures. Advanced arthritis both shoulders. IMPRESSION: Left basilar atelectasis or infiltrates. Interstitial coarsening may be due to edema or infection. Electronically Signed   By: Minerva Fester M.D.   On: 07/10/2023 20:29        Scheduled Meds:  ALPRAZolam  0.5 mg Oral QHS   amLODipine  10 mg Oral Daily   digoxin  0.0625 mg Oral Daily   enoxaparin (LOVENOX) injection  40 mg Subcutaneous Q24H   fludrocortisone  0.1 mg Oral Daily   guaiFENesin-dextromethorphan  15 mL Oral Q8H   hydrocortisone sod succinate (SOLU-CORTEF) inj  40 mg Intravenous Q8H   hydroxychloroquine  200 mg Oral Daily   irbesartan  300 mg Oral Daily   oseltamivir  75 mg Oral BID     LOS: 1 day    Time spent: 35 minutes    John Pile Hoover Brunette, DO [facility]  If 7PM-7AM, please contact night-coverage [email or web address] 07/11/2023, 3:35 PM

## 2023-07-11 NOTE — ED Notes (Signed)
 Pt refused breakfast tray this morning, states he normally doesn't eat breakfast and that his is not hungry at this time.   Also, turned pt oxygen down to see if we can wean him off of the oxygen today, per Dr. Sherryll Burger.

## 2023-07-11 NOTE — TOC CM/SW Note (Signed)
 Transition of Care [facility]) - Inpatient Brief Assessment   Patient Details  Name: John Lyons MRN: [id number] Date of Birth: 1945-11-29  Transition of Care [facility]) CM/SW Contact:    Villa Herb, LCSWA Phone Number: 07/11/2023, 9:17 AM   Clinical Narrative: Transition of Care Department [facility]) has reviewed patient and no TOC needs have been identified at this time. We will continue to monitor patient advancement through interdiciplinary progression rounds. If new patient transition needs arise, please place a TOC consult.   Transition of Care Asessment: Insurance and Status: Insurance coverage has been reviewed Patient has primary care physician: Yes Home environment has been reviewed: From home Prior level of function:: Independent Prior/Current Home Services: No current home services Social Drivers of Health Review: SDOH reviewed no interventions necessary Readmission risk has been reviewed: Yes Transition of care needs: no transition of care needs at this time

## 2023-07-11 NOTE — Plan of Care (Signed)
   Problem: Education: Goal: Knowledge of General Education information will improve Description: Including pain rating scale, medication(s)/side effects and non-pharmacologic comfort measures Outcome: Progressing   Problem: Clinical Measurements: Goal: Respiratory complications will improve Outcome: Progressing   Problem: Activity: Goal: Risk for activity intolerance will decrease Outcome: Progressing

## 2023-07-12 DIAGNOSIS — J1100 Influenza due to unidentified influenza virus with unspecified type of pneumonia: Secondary | ICD-10-CM | POA: Diagnosis not present

## 2023-07-12 LAB — BASIC METABOLIC PANEL
Anion gap: 9 (ref 5–15)
BUN: 19 mg/dL (ref 8–23)
CO2: 22 mmol/L (ref 22–32)
Calcium: 7.8 mg/dL — ABNORMAL LOW (ref 8.9–10.3)
Chloride: 105 mmol/L (ref 98–111)
Creatinine, Ser: 0.51 mg/dL — ABNORMAL LOW (ref 0.61–1.24)
GFR, Estimated: >60 mL/min (ref >60)
Glucose, Bld: 129 mg/dL — ABNORMAL HIGH (ref 70–99)
Potassium: 3.4 mmol/L — ABNORMAL LOW (ref 3.5–5.1)
Sodium: 136 mmol/L (ref 135–145)

## 2023-07-12 LAB — CBC
HCT: 38.6 % — ABNORMAL LOW (ref 39.0–52.0)
Hemoglobin: 13.8 g/dL (ref 13.0–17.0)
MCH: 30.9 pg (ref 26.0–34.0)
MCHC: 35.8 g/dL (ref 30.0–36.0)
MCV: 86.5 fL (ref 80.0–100.0)
Platelets: 193 10*3/uL (ref 150–400)
RBC: 4.46 MIL/uL (ref 4.22–5.81)
RDW: 13.8 % (ref 11.5–15.5)
WBC: 2.9 10*3/uL — ABNORMAL LOW (ref 4.0–10.5)
nRBC: 0 % (ref 0.0–0.2)

## 2023-07-12 LAB — MAGNESIUM: Magnesium: 1.7 mg/dL (ref 1.7–2.4)

## 2023-07-12 MED ORDER — OSELTAMIVIR PHOSPHATE 75 MG PO CAPS: 75.0000 mg | ORAL_CAPSULE | Freq: Two times a day (BID) | ORAL | 0 refills | Status: AC

## 2023-07-12 MED ORDER — AMLODIPINE BESYLATE 10 MG PO TABS: 10.0000 mg | ORAL_TABLET | Freq: Every day | ORAL | 0 refills | Status: AC

## 2023-07-12 MED ORDER — ALPRAZOLAM 0.5 MG PO TABS: 0.5000 mg | ORAL_TABLET | Freq: Every evening | ORAL | 0 refills | Status: AC | PRN

## 2023-07-12 MED ORDER — POTASSIUM CHLORIDE CRYS ER 20 MEQ PO TBCR
40.0000 meq | EXTENDED_RELEASE_TABLET | Freq: Once | ORAL | Status: AC
  Administered 2023-07-12: 40 meq via ORAL
  Filled 2023-07-12: qty 2

## 2023-07-12 MED ORDER — DIGOXIN 125 MCG PO TABS: 0.0625 mg | ORAL_TABLET | Freq: Every day | ORAL | 0 refills | Status: AC

## 2023-07-12 MED ORDER — MELATONIN 3 MG PO TABS
6.0000 mg | ORAL_TABLET | Freq: Once | ORAL | Status: AC
  Administered 2023-07-12: 6 mg via ORAL
  Filled 2023-07-12: qty 2

## 2023-07-12 NOTE — Plan of Care (Signed)

## 2023-07-12 NOTE — Discharge Summary (Addendum)
 Physician Discharge Summary  John Lyons ZOX:[id number] DOB: 1945-05-23 DOA: 07/10/2023  PCP: Rodrigo Ran, MD  Admit date: 07/10/2023  Discharge date: 07/12/2023  Admitted From:Home  Disposition:  Home  Recommendations for Outpatient Follow-up:  Follow up with PCP in 1-2 weeks Remain on Tamiflu for 3 more days to finish up total 5-day course of treatment Remain on other home medications as prescribed  Home Health: None  Equipment/Devices: None  Discharge Condition:Stable  CODE STATUS: DNR  Diet recommendation: Heart Healthy  Brief/Interim Summary: John Lyons is a 78 y.o. male with medical history significant for diabetes, hypertension, rheumatoid arthritis, Addison's disease. Patient presented to the ED with complaints of generalized weakness, cough and bodyaches with fevers and chills over the past 2 days.  He went to see his outpatient provider yesterday was diagnosed with pneumonia and started on levofloxacin.  Last night he started feeling worse.  He was admitted with acute hypoxemic respiratory failure secondary to influenza pneumonia and was started on Tamiflu.  He was initially covered with IV antibiotics which were discontinued as procalcitonin was low.  He is overall done quite well and has been able to wean off of oxygen at this point and is in stable condition for discharge.  No other acute events or concerns noted.  Discharge Diagnoses:  Principal Problem:   Influenza and pneumonia Active Problems:   Hyponatremia   Acute hypoxic respiratory failure (HCC)   SIRS (systemic inflammatory response syndrome) (HCC)   Addison's disease (HCC)   Rheumatoid arthritis (HCC)   Hypertension  Principal discharge diagnosis: Acute hypoxemic respiratory failure secondary to influenza pneumonia.  Discharge Instructions  Discharge Instructions     Diet - low sodium heart healthy   Complete by: As directed    Increase activity slowly   Complete by: As directed        Allergies as of 07/12/2023       Reactions   Adhesive [tape] Rash   Phenobarbital Rash        Medication List     STOP taking these medications    levofloxacin 500 MG tablet Commonly known as: LEVAQUIN   Lipitor 10 MG tablet Generic drug: atorvastatin       TAKE these medications    acetaminophen 500 MG tablet Commonly known as: TYLENOL Take 1,000 mg by mouth every 6 (six) hours as needed (for pain).   ALPRAZolam 0.25 MG tablet Commonly known as: XANAX Take 0.25 mg by mouth See admin instructions. Take 1 tablet 1 to 3 times a day as needed   ALPRAZolam 0.5 MG tablet Commonly known as: XANAX Take 1 tablet (0.5 mg total) by mouth at bedtime as needed for anxiety or sleep.   amLODipine 10 MG tablet Commonly known as: NORVASC Take 1 tablet (10 mg total) by mouth daily. Start taking on: July 13, 2023 What changed:  additional instructions Another medication with the same name was removed. Continue taking this medication, and follow the directions you see here.   clobetasol cream 0.05 % Commonly known as: TEMOVATE Apply topically 2 (two) times daily.   digoxin 0.125 MG tablet Commonly known as: LANOXIN Take 0.5 tablets (0.0625 mg total) by mouth daily.   fludrocortisone 0.1 MG tablet Commonly known as: FLORINEF Take 0.1 mg by mouth daily.   folic acid 400 MCG tablet Commonly known as: FOLVITE Take 400 mcg by mouth daily.   furosemide 20 MG tablet Commonly known as: LASIX Take 20 mg by mouth daily.   hydrocortisone 20 MG tablet  Commonly known as: CORTEF Take 20 mg by mouth daily.   hydroxychloroquine 200 MG tablet Commonly known as: PLAQUENIL Take 200 mg by mouth daily.   lactobacillus acidophilus & bulgar chewable tablet Chew 1 tablet by mouth 3 (three) times daily with meals.   Linzess 145 MCG Caps capsule Generic drug: linaclotide Take 145 mcg by mouth daily as needed (constipation).   Mucinex DM 30-600 MG Tb12 Take 0.5-1 tablets by  mouth 2 (two) times daily as needed (for coughing/congestion).   multivitamin with minerals Tabs tablet Take 1 tablet by mouth daily.   omeprazole 20 MG capsule Commonly known as: PRILOSEC Take 20 mg by mouth 2 (two) times daily.   ondansetron 4 MG disintegrating tablet Commonly known as: ZOFRAN-ODT SMARTSIG:1 Tablet(s) By Mouth Every 0-8 Hours   oseltamivir 75 MG capsule Commonly known as: TAMIFLU Take 1 capsule (75 mg total) by mouth 2 (two) times daily for 3 days.   pantoprazole 40 MG tablet Commonly known as: PROTONIX Take 40 mg by mouth daily.   polyethylene glycol powder 17 GM/SCOOP powder Commonly known as: GLYCOLAX/MIRALAX Take 17 g by mouth daily as needed for mild constipation.   Potassium Chloride ER 20 MEQ Tbcr Take 20 mEq by mouth daily. 1 tab daily by mouth   pravastatin 20 MG tablet Commonly known as: PRAVACHOL Take 20 mg by mouth at bedtime.   telmisartan 80 MG tablet Commonly known as: MICARDIS Take 80 mg by mouth daily.   Vitamin D3 25 MCG (1000 UT) Caps Take 1,000 Units by mouth daily.        Follow-up Information     Rodrigo Ran, MD. Schedule an appointment as soon as possible for a visit in 1 week(s).   Specialty: Internal Medicine Contact information: [address] [place] [address] [phone]                Allergies  Allergen Reactions   Adhesive [Tape] Rash   Phenobarbital Rash    Consultations: None   Procedures/Studies: DG Chest Port 1 View Result Date: 07/10/2023 CLINICAL DATA:  Questionable sepsis. Diagnosed with pneumonia yesterday. Generalized weakness today. EXAM: PORTABLE CHEST 1 VIEW COMPARISON:  02/15/2018 FINDINGS: Stable cardiomediastinal silhouette. Diffuse interstitial coarsening and bronchial wall thickening. Left basilar atelectasis or infiltrates. No pleural effusion or pneumothorax. No displaced rib fractures. Advanced arthritis both shoulders. IMPRESSION: Left basilar atelectasis or  infiltrates. Interstitial coarsening may be due to edema or infection. Electronically Signed   By: Minerva Fester M.D.   On: 07/10/2023 20:29     Discharge Exam: Vitals:   07/11/23 2032 07/12/23 0408  BP: 134/61 137/61  Pulse: 81 75  Resp: 20 18  Temp: 99.4 F (37.4 C) 98.8 F (37.1 C)  SpO2: 98% 97%   Vitals:   07/11/23 1222 07/11/23 1659 07/11/23 2032 07/12/23 0408  BP: (!) 158/74 130/63 134/61 137/61  Pulse: 85 79 81 75  Resp: 18 18 20 18   Temp:  98.7 F (37.1 C) 99.4 F (37.4 C) 98.8 F (37.1 C)  TempSrc:  Oral Oral   SpO2: 100% 98% 98% 97%  Weight:      Height:        General: Pt is alert, awake, not in acute distress Cardiovascular: RRR, S1/S2 +, no rubs, no gallops Respiratory: CTA bilaterally, no wheezing, no rhonchi Abdominal: Soft, NT, ND, bowel sounds + Extremities: no edema, no cyanosis    The results of significant diagnostics from this hospitalization (including imaging, microbiology, ancillary and laboratory) are listed  below for reference.     Microbiology: Recent Results (from the past 240 hours)  Blood Culture (routine x 2)     Status: None (Preliminary result)   Collection Time: 07/10/23  6:45 PM   Specimen: Left Antecubital; Blood  Result Value Ref Range Status   Specimen Description   Final    LEFT ANTECUBITAL BOTTLES DRAWN AEROBIC AND ANAEROBIC   Special Requests Blood Culture adequate volume  Final   Culture   Final    NO GROWTH 2 DAYS Performed at [facility], [address] [place], [place] [address]    Report Status PENDING  Incomplete  Blood Culture (routine x 2)     Status: None (Preliminary result)   Collection Time: 07/10/23  6:45 PM   Specimen: BLOOD LEFT FOREARM  Result Value Ref Range Status   Specimen Description   Final    BLOOD LEFT FOREARM BOTTLES DRAWN AEROBIC AND ANAEROBIC   Special Requests Blood Culture adequate volume  Final   Culture   Final    NO GROWTH 2 DAYS Performed at [facility], [address],  [place], [place] [address]    Report Status PENDING  Incomplete  Resp panel by RT-PCR (RSV, Flu A&B, Covid) Anterior Nasal Swab     Status: Abnormal   Collection Time: 07/10/23  6:50 PM   Specimen: Anterior Nasal Swab  Result Value Ref Range Status   SARS Coronavirus 2 by RT PCR NEGATIVE NEGATIVE Final    Comment: (NOTE) SARS-CoV-2 target nucleic acids are NOT DETECTED.  The SARS-CoV-2 RNA is generally detectable in upper respiratory specimens during the acute phase of infection. The lowest concentration of SARS-CoV-2 viral copies this assay can detect is 138 copies/mL. A negative result does not preclude SARS-Cov-2 infection and should not be used as the sole basis for treatment or other patient management decisions. A negative result may occur with  improper specimen collection/handling, submission of specimen other than nasopharyngeal swab, presence of viral mutation(s) within the areas targeted by this assay, and inadequate number of viral copies(<138 copies/mL). A negative result must be combined with clinical observations, patient history, and epidemiological information. The expected result is Negative.  Fact Sheet for Patients:  BloggerCourse.com  Fact Sheet for Healthcare Providers:  SeriousBroker.it  This test is no t yet approved or cleared by the [place] FDA and  has been authorized for detection and/or diagnosis of SARS-CoV-2 by FDA under an Emergency Use Authorization (EUA). This EUA will remain  in effect (meaning this test can be used) for the duration of the COVID-19 declaration under Section 564(b)(1) of the Act, 21 U.S.C.section 360bbb-3(b)(1), unless the authorization is terminated  or revoked sooner.       Influenza A by PCR POSITIVE (A) NEGATIVE Final   Influenza B by PCR NEGATIVE NEGATIVE Final    Comment: (NOTE) The Xpert Xpress SARS-CoV-2/FLU/RSV plus assay is intended as an aid in the diagnosis of  influenza from Nasopharyngeal swab specimens and should not be used as a sole basis for treatment. Nasal washings and aspirates are unacceptable for Xpert Xpress SARS-CoV-2/FLU/RSV testing.  Fact Sheet for Patients: BloggerCourse.com  Fact Sheet for Healthcare Providers: SeriousBroker.it  This test is not yet approved or cleared by the [place] FDA and has been authorized for detection and/or diagnosis of SARS-CoV-2 by FDA under an Emergency Use Authorization (EUA). This EUA will remain in effect (meaning this test can be used) for the duration of the COVID-19 declaration under Section 564(b)(1) of the  Act, 21 U.S.C. section 360bbb-3(b)(1), unless the authorization is terminated or revoked.     Resp Syncytial Virus by PCR NEGATIVE NEGATIVE Final    Comment: (NOTE) Fact Sheet for Patients: BloggerCourse.com  Fact Sheet for Healthcare Providers: SeriousBroker.it  This test is not yet approved or cleared by the [place] FDA and has been authorized for detection and/or diagnosis of SARS-CoV-2 by FDA under an Emergency Use Authorization (EUA). This EUA will remain in effect (meaning this test can be used) for the duration of the COVID-19 declaration under Section 564(b)(1) of the Act, 21 U.S.C. section 360bbb-3(b)(1), unless the authorization is terminated or revoked.  Performed at [facility], [address] [place], [place] [address]      Labs: BNP (last 3 results) No results for input(s): "BNP" in the last 8760 hours. Basic Metabolic Panel: Recent Labs  Lab 07/10/23 1843 07/11/23 0542 07/12/23 0435  NA 130* 132* 136  K 3.4* 3.5 3.4*  CL 97* 103 105  CO2 19* 19* 22  GLUCOSE 87 102* 129*  BUN 14 19 19   CREATININE 0.80 0.69 0.51*  CALCIUM 8.1* 7.4* 7.8*  MG  --   --  1.7   Liver Function Tests: Recent Labs  Lab 07/10/23 1843  AST 68*  ALT 54*   ALKPHOS 61  BILITOT 1.3*  PROT 6.5  ALBUMIN 3.6   No results for input(s): "LIPASE", "AMYLASE" in the last 168 hours. No results for input(s): "AMMONIA" in the last 168 hours. CBC: Recent Labs  Lab 07/10/23 1843 07/11/23 0542 07/12/23 0435  WBC 2.6* 2.4* 2.9*  NEUTROABS 1.7  --   --   HGB 14.6 14.5 13.8  HCT 42.2 42.7 38.6*  MCV 88.7 90.3 86.5  PLT 189 142* 193   Cardiac Enzymes: No results for input(s): "CKTOTAL", "CKMB", "CKMBINDEX", "TROPONINI" in the last 168 hours. BNP: Invalid input(s): "POCBNP" CBG: No results for input(s): "GLUCAP" in the last 168 hours. D-Dimer No results for input(s): "DDIMER" in the last 72 hours. Hgb A1c No results for input(s): "HGBA1C" in the last 72 hours. Lipid Profile No results for input(s): "CHOL", "HDL", "LDLCALC", "TRIG", "CHOLHDL", "LDLDIRECT" in the last 72 hours. Thyroid function studies No results for input(s): "TSH", "T4TOTAL", "T3FREE", "THYROIDAB" in the last 72 hours.  Invalid input(s): "FREET3" Anemia work up No results for input(s): "VITAMINB12", "FOLATE", "FERRITIN", "TIBC", "IRON", "RETICCTPCT" in the last 72 hours. Urinalysis    Component Value Date/Time   COLORURINE YELLOW 07/10/2023 2034   APPEARANCEUR CLEAR 07/10/2023 2034   LABSPEC 1.016 07/10/2023 2034   PHURINE 5.0 07/10/2023 2034   GLUCOSEU NEGATIVE 07/10/2023 2034   HGBUR NEGATIVE 07/10/2023 2034   BILIRUBINUR NEGATIVE 07/10/2023 2034   KETONESUR 20 (A) 07/10/2023 2034   PROTEINUR NEGATIVE 07/10/2023 2034   UROBILINOGEN 0.2 02/02/2012 1135   NITRITE NEGATIVE 07/10/2023 2034   LEUKOCYTESUR NEGATIVE 07/10/2023 2034   Sepsis Labs Recent Labs  Lab 07/10/23 1843 07/11/23 0542 07/12/23 0435  WBC 2.6* 2.4* 2.9*   Microbiology Recent Results (from the past 240 hours)  Blood Culture (routine x 2)     Status: None (Preliminary result)   Collection Time: 07/10/23  6:45 PM   Specimen: Left Antecubital; Blood  Result Value Ref Range Status   Specimen  Description   Final    LEFT ANTECUBITAL BOTTLES DRAWN AEROBIC AND ANAEROBIC   Special Requests Blood Culture adequate volume  Final   Culture   Final    NO GROWTH 2 DAYS Performed at [facility],  [address] [place], [place] [address]    Report Status PENDING  Incomplete  Blood Culture (routine x 2)     Status: None (Preliminary result)   Collection Time: 07/10/23  6:45 PM   Specimen: BLOOD LEFT FOREARM  Result Value Ref Range Status   Specimen Description   Final    BLOOD LEFT FOREARM BOTTLES DRAWN AEROBIC AND ANAEROBIC   Special Requests Blood Culture adequate volume  Final   Culture   Final    NO GROWTH 2 DAYS Performed at [facility], [address], [place], [place] [address]    Report Status PENDING  Incomplete  Resp panel by RT-PCR (RSV, Flu A&B, Covid) Anterior Nasal Swab     Status: Abnormal   Collection Time: 07/10/23  6:50 PM   Specimen: Anterior Nasal Swab  Result Value Ref Range Status   SARS Coronavirus 2 by RT PCR NEGATIVE NEGATIVE Final    Comment: (NOTE) SARS-CoV-2 target nucleic acids are NOT DETECTED.  The SARS-CoV-2 RNA is generally detectable in upper respiratory specimens during the acute phase of infection. The lowest concentration of SARS-CoV-2 viral copies this assay can detect is 138 copies/mL. A negative result does not preclude SARS-Cov-2 infection and should not be used as the sole basis for treatment or other patient management decisions. A negative result may occur with  improper specimen collection/handling, submission of specimen other than nasopharyngeal swab, presence of viral mutation(s) within the areas targeted by this assay, and inadequate number of viral copies(<138 copies/mL). A negative result must be combined with clinical observations, patient history, and epidemiological information. The expected result is Negative.  Fact Sheet for Patients:  BloggerCourse.com  Fact Sheet for Healthcare Providers:   SeriousBroker.it  This test is no t yet approved or cleared by the [place] FDA and  has been authorized for detection and/or diagnosis of SARS-CoV-2 by FDA under an Emergency Use Authorization (EUA). This EUA will remain  in effect (meaning this test can be used) for the duration of the COVID-19 declaration under Section 564(b)(1) of the Act, 21 U.S.C.section 360bbb-3(b)(1), unless the authorization is terminated  or revoked sooner.       Influenza A by PCR POSITIVE (A) NEGATIVE Final   Influenza B by PCR NEGATIVE NEGATIVE Final    Comment: (NOTE) The Xpert Xpress SARS-CoV-2/FLU/RSV plus assay is intended as an aid in the diagnosis of influenza from Nasopharyngeal swab specimens and should not be used as a sole basis for treatment. Nasal washings and aspirates are unacceptable for Xpert Xpress SARS-CoV-2/FLU/RSV testing.  Fact Sheet for Patients: BloggerCourse.com  Fact Sheet for Healthcare Providers: SeriousBroker.it  This test is not yet approved or cleared by the [place] FDA and has been authorized for detection and/or diagnosis of SARS-CoV-2 by FDA under an Emergency Use Authorization (EUA). This EUA will remain in effect (meaning this test can be used) for the duration of the COVID-19 declaration under Section 564(b)(1) of the Act, 21 U.S.C. section 360bbb-3(b)(1), unless the authorization is terminated or revoked.     Resp Syncytial Virus by PCR NEGATIVE NEGATIVE Final    Comment: (NOTE) Fact Sheet for Patients: BloggerCourse.com  Fact Sheet for Healthcare Providers: SeriousBroker.it  This test is not yet approved or cleared by the [place] FDA and has been authorized for detection and/or diagnosis of SARS-CoV-2 by FDA under an Emergency Use Authorization (EUA). This EUA will remain in effect (meaning this test can be used)  for the duration of the COVID-19 declaration under Section  564(b)(1) of the Act, 21 U.S.C. section 360bbb-3(b)(1), unless the authorization is terminated or revoked.  Performed at [facility], [address] [place], [place] [address]      Time coordinating discharge: 35 minutes  SIGNED:   Erick Blinks, DO [facility] 07/12/2023, 12:56 PM  If 7PM-7AM, please contact night-coverage [email or web address]

## 2023-07-12 NOTE — Progress Notes (Signed)
 Mobility Specialist Progress Note:    07/12/23 1206  Mobility  Activity Ambulated with assistance in room  Level of Assistance Minimal assist, patient does 75% or more  Assistive Device Front wheel walker  Distance Ambulated (ft) 55 ft  Range of Motion/Exercises Active;All extremities  Activity Response Tolerated well  Mobility Referral Yes  Mobility visit 1 Mobility  Mobility Specialist Start Time (ACUTE ONLY) 1145  Mobility Specialist Stop Time (ACUTE ONLY) 1205  Mobility Specialist Time Calculation (min) (ACUTE ONLY) 20 min   Pt received in bed, agreeable to mobility. Required MinA to stand and CGA to ambulate with RW. Tolerated well, SpO2 96% on RA throughout ambulation. SpO2 98% on RA at rest and after ambulation. Returned pt supine, alarm on. All needs met.   Lawerance Bach Mobility Specialist Please contact via Special educational needs teacher or  Rehab office at [phone]

## 2023-07-12 NOTE — Progress Notes (Signed)
 Ng Discharge Note  Admit Date:  07/10/2023 Discharge date: 07/12/2023   Reymond Maynez to be D/C'd Home per MD order.  AVS completed. Patient/caregiver able to verbalize understanding.  Discharge Medication: Allergies as of 07/12/2023       Reactions   Adhesive [tape] Rash   Phenobarbital Rash        Medication List     STOP taking these medications    levofloxacin 500 MG tablet Commonly known as: LEVAQUIN       TAKE these medications    acetaminophen 500 MG tablet Commonly known as: TYLENOL Take 1,000 mg by mouth every 6 (six) hours as needed (for pain).   ALPRAZolam 0.25 MG tablet Commonly known as: XANAX Take 0.25 mg by mouth See admin instructions. Take 1 tablet 1 to 3 times a day as needed   ALPRAZolam 0.5 MG tablet Commonly known as: XANAX Take 1 tablet (0.5 mg total) by mouth at bedtime as needed for anxiety or sleep.   amLODipine 10 MG tablet Commonly known as: NORVASC Take 1 tablet (10 mg total) by mouth daily. Start taking on: July 13, 2023 What changed:  additional instructions Another medication with the same name was removed. Continue taking this medication, and follow the directions you see here.   clobetasol cream 0.05 % Commonly known as: TEMOVATE Apply topically 2 (two) times daily.   digoxin 0.125 MG tablet Commonly known as: LANOXIN Take 0.5 tablets (0.0625 mg total) by mouth daily.   fludrocortisone 0.1 MG tablet Commonly known as: FLORINEF Take 0.1 mg by mouth daily.   folic acid 400 MCG tablet Commonly known as: FOLVITE Take 400 mcg by mouth daily.   furosemide 20 MG tablet Commonly known as: LASIX Take 20 mg by mouth daily.   hydrocortisone 20 MG tablet Commonly known as: CORTEF Take 20 mg by mouth daily.   hydroxychloroquine 200 MG tablet Commonly known as: PLAQUENIL Take 200 mg by mouth daily.   lactobacillus acidophilus & bulgar chewable tablet Chew 1 tablet by mouth 3 (three) times daily with meals.   Linzess  145 MCG Caps capsule Generic drug: linaclotide Take 145 mcg by mouth daily as needed (constipation).   Lipitor 10 MG tablet Generic drug: atorvastatin 1 tablet Orally Once a day   Mucinex DM 30-600 MG Tb12 Take 0.5-1 tablets by mouth 2 (two) times daily as needed (for coughing/congestion).   multivitamin with minerals Tabs tablet Take 1 tablet by mouth daily.   omeprazole 20 MG capsule Commonly known as: PRILOSEC Take 20 mg by mouth 2 (two) times daily.   ondansetron 4 MG disintegrating tablet Commonly known as: ZOFRAN-ODT SMARTSIG:1 Tablet(s) By Mouth Every 0-8 Hours   oseltamivir 75 MG capsule Commonly known as: TAMIFLU Take 1 capsule (75 mg total) by mouth 2 (two) times daily for 3 days.   pantoprazole 40 MG tablet Commonly known as: PROTONIX Take 40 mg by mouth daily.   polyethylene glycol powder 17 GM/SCOOP powder Commonly known as: GLYCOLAX/MIRALAX Take 17 g by mouth daily as needed for mild constipation.   Potassium Chloride ER 20 MEQ Tbcr Take 20 mEq by mouth daily. 1 tab daily by mouth   pravastatin 20 MG tablet Commonly known as: PRAVACHOL Take 20 mg by mouth at bedtime.   telmisartan 80 MG tablet Commonly known as: MICARDIS Take 80 mg by mouth daily.   Vitamin D3 25 MCG (1000 UT) Caps Take 1,000 Units by mouth daily.        Discharge Assessment: Vitals:  07/11/23 2032 07/12/23 0408  BP: 134/61 137/61  Pulse: 81 75  Resp: 20 18  Temp: 99.4 F (37.4 C) 98.8 F (37.1 C)  SpO2: 98% 97%   Skin clean, dry and intact without evidence of skin break down, no evidence of skin tears noted. IV catheter discontinued intact. Site without signs and symptoms of complications - no redness or edema noted at insertion site, patient denies c/o pain - only slight tenderness at site.  Dressing with slight pressure applied.  D/c Instructions-Education: Discharge instructions given to patient/family with verbalized understanding. D/c education completed with  patient/family including follow up instructions, medication list, d/c activities limitations if indicated, with other d/c instructions as indicated by MD - patient able to verbalize understanding, all questions fully answered. Patient instructed to return to ED, call 911, or call MD for any changes in condition.  Patient escorted via WC, and D/C home via private auto.  Cristal Ford, LPN 5/78/4696 29:52 PM

## 2023-07-15 LAB — CULTURE, BLOOD (ROUTINE X 2)
Culture: NO GROWTH
Culture: NO GROWTH
Special Requests: ADEQUATE
Special Requests: ADEQUATE

## 2023-07-20 DIAGNOSIS — I10 Essential (primary) hypertension: Secondary | ICD-10-CM | POA: Diagnosis not present

## 2023-07-20 DIAGNOSIS — J1100 Influenza due to unidentified influenza virus with unspecified type of pneumonia: Secondary | ICD-10-CM | POA: Diagnosis not present

## 2023-07-20 DIAGNOSIS — J101 Influenza due to other identified influenza virus with other respiratory manifestations: Secondary | ICD-10-CM | POA: Diagnosis not present

## 2023-07-20 DIAGNOSIS — J9601 Acute respiratory failure with hypoxia: Secondary | ICD-10-CM | POA: Diagnosis not present

## 2023-07-20 DIAGNOSIS — R6510 Systemic inflammatory response syndrome (SIRS) of non-infectious origin without acute organ dysfunction: Secondary | ICD-10-CM | POA: Diagnosis not present

## 2023-07-20 DIAGNOSIS — M069 Rheumatoid arthritis, unspecified: Secondary | ICD-10-CM | POA: Diagnosis not present

## 2023-07-31 DIAGNOSIS — M0589 Other rheumatoid arthritis with rheumatoid factor of multiple sites: Secondary | ICD-10-CM | POA: Diagnosis not present

## 2023-08-29 DIAGNOSIS — L570 Actinic keratosis: Secondary | ICD-10-CM | POA: Diagnosis not present

## 2023-08-29 DIAGNOSIS — L719 Rosacea, unspecified: Secondary | ICD-10-CM | POA: Diagnosis not present

## 2023-08-29 DIAGNOSIS — L820 Inflamed seborrheic keratosis: Secondary | ICD-10-CM | POA: Diagnosis not present

## 2023-10-10 DIAGNOSIS — I872 Venous insufficiency (chronic) (peripheral): Secondary | ICD-10-CM | POA: Diagnosis not present

## 2023-10-10 DIAGNOSIS — L719 Rosacea, unspecified: Secondary | ICD-10-CM | POA: Diagnosis not present

## 2023-10-10 DIAGNOSIS — L570 Actinic keratosis: Secondary | ICD-10-CM | POA: Diagnosis not present

## 2023-11-06 DIAGNOSIS — Z6827 Body mass index (BMI) 27.0-27.9, adult: Secondary | ICD-10-CM | POA: Diagnosis not present

## 2023-11-06 DIAGNOSIS — M0589 Other rheumatoid arthritis with rheumatoid factor of multiple sites: Secondary | ICD-10-CM | POA: Diagnosis not present

## 2023-11-06 DIAGNOSIS — E271 Primary adrenocortical insufficiency: Secondary | ICD-10-CM | POA: Diagnosis not present

## 2023-11-06 DIAGNOSIS — M810 Age-related osteoporosis without current pathological fracture: Secondary | ICD-10-CM | POA: Diagnosis not present

## 2023-11-06 DIAGNOSIS — E663 Overweight: Secondary | ICD-10-CM | POA: Diagnosis not present

## 2023-11-06 DIAGNOSIS — M1991 Primary osteoarthritis, unspecified site: Secondary | ICD-10-CM | POA: Diagnosis not present

## 2023-11-13 DIAGNOSIS — M810 Age-related osteoporosis without current pathological fracture: Secondary | ICD-10-CM | POA: Diagnosis not present

## 2023-11-16 DIAGNOSIS — H524 Presbyopia: Secondary | ICD-10-CM | POA: Diagnosis not present

## 2023-11-16 DIAGNOSIS — Z79899 Other long term (current) drug therapy: Secondary | ICD-10-CM | POA: Diagnosis not present

## 2023-11-16 DIAGNOSIS — Z961 Presence of intraocular lens: Secondary | ICD-10-CM | POA: Diagnosis not present

## 2023-12-06 DIAGNOSIS — L719 Rosacea, unspecified: Secondary | ICD-10-CM | POA: Diagnosis not present

## 2023-12-06 DIAGNOSIS — I872 Venous insufficiency (chronic) (peripheral): Secondary | ICD-10-CM | POA: Diagnosis not present

## 2023-12-06 DIAGNOSIS — L821 Other seborrheic keratosis: Secondary | ICD-10-CM | POA: Diagnosis not present

## 2024-02-01 DIAGNOSIS — Z125 Encounter for screening for malignant neoplasm of prostate: Secondary | ICD-10-CM | POA: Diagnosis not present

## 2024-02-01 DIAGNOSIS — D509 Iron deficiency anemia, unspecified: Secondary | ICD-10-CM | POA: Diagnosis not present

## 2024-02-01 DIAGNOSIS — Z0189 Encounter for other specified special examinations: Secondary | ICD-10-CM | POA: Diagnosis not present

## 2024-02-01 DIAGNOSIS — I10 Essential (primary) hypertension: Secondary | ICD-10-CM | POA: Diagnosis not present

## 2024-02-01 DIAGNOSIS — K219 Gastro-esophageal reflux disease without esophagitis: Secondary | ICD-10-CM | POA: Diagnosis not present

## 2024-02-01 DIAGNOSIS — Z1212 Encounter for screening for malignant neoplasm of rectum: Secondary | ICD-10-CM | POA: Diagnosis not present

## 2024-02-01 DIAGNOSIS — E785 Hyperlipidemia, unspecified: Secondary | ICD-10-CM | POA: Diagnosis not present

## 2024-02-01 DIAGNOSIS — M810 Age-related osteoporosis without current pathological fracture: Secondary | ICD-10-CM | POA: Diagnosis not present

## 2024-02-08 DIAGNOSIS — Z96643 Presence of artificial hip joint, bilateral: Secondary | ICD-10-CM | POA: Diagnosis not present

## 2024-02-08 DIAGNOSIS — M069 Rheumatoid arthritis, unspecified: Secondary | ICD-10-CM | POA: Diagnosis not present

## 2024-02-08 DIAGNOSIS — E2740 Unspecified adrenocortical insufficiency: Secondary | ICD-10-CM | POA: Diagnosis not present

## 2024-02-08 DIAGNOSIS — I872 Venous insufficiency (chronic) (peripheral): Secondary | ICD-10-CM | POA: Diagnosis not present

## 2024-02-08 DIAGNOSIS — D473 Essential (hemorrhagic) thrombocythemia: Secondary | ICD-10-CM | POA: Diagnosis not present

## 2024-02-08 DIAGNOSIS — D849 Immunodeficiency, unspecified: Secondary | ICD-10-CM | POA: Diagnosis not present

## 2024-02-08 DIAGNOSIS — I700 Atherosclerosis of aorta: Secondary | ICD-10-CM | POA: Diagnosis not present

## 2024-02-08 DIAGNOSIS — Z0000 Encounter for general adult medical examination without abnormal findings: Secondary | ICD-10-CM | POA: Diagnosis not present

## 2024-02-08 DIAGNOSIS — K5792 Diverticulitis of intestine, part unspecified, without perforation or abscess without bleeding: Secondary | ICD-10-CM | POA: Diagnosis not present

## 2024-02-08 DIAGNOSIS — R82998 Other abnormal findings in urine: Secondary | ICD-10-CM | POA: Diagnosis not present

## 2024-02-08 DIAGNOSIS — I10 Essential (primary) hypertension: Secondary | ICD-10-CM | POA: Diagnosis not present

## 2024-02-08 DIAGNOSIS — E785 Hyperlipidemia, unspecified: Secondary | ICD-10-CM | POA: Diagnosis not present

## 2024-02-08 DIAGNOSIS — Z9049 Acquired absence of other specified parts of digestive tract: Secondary | ICD-10-CM | POA: Diagnosis not present

## 2024-02-28 DIAGNOSIS — R195 Other fecal abnormalities: Secondary | ICD-10-CM | POA: Diagnosis not present

## 2024-02-29 DIAGNOSIS — I10 Essential (primary) hypertension: Secondary | ICD-10-CM | POA: Diagnosis not present

## 2024-02-29 DIAGNOSIS — R051 Acute cough: Secondary | ICD-10-CM | POA: Diagnosis not present

## 2024-02-29 DIAGNOSIS — J069 Acute upper respiratory infection, unspecified: Secondary | ICD-10-CM | POA: Diagnosis not present

## 2024-02-29 DIAGNOSIS — R5383 Other fatigue: Secondary | ICD-10-CM | POA: Diagnosis not present

## 2024-02-29 DIAGNOSIS — J019 Acute sinusitis, unspecified: Secondary | ICD-10-CM | POA: Diagnosis not present

## 2024-02-29 DIAGNOSIS — D849 Immunodeficiency, unspecified: Secondary | ICD-10-CM | POA: Diagnosis not present

## 2024-02-29 DIAGNOSIS — Z1152 Encounter for screening for COVID-19: Secondary | ICD-10-CM | POA: Diagnosis not present

## 2024-02-29 DIAGNOSIS — R0981 Nasal congestion: Secondary | ICD-10-CM | POA: Diagnosis not present

## 2024-03-05 DIAGNOSIS — R195 Other fecal abnormalities: Secondary | ICD-10-CM | POA: Diagnosis not present

## 2024-03-05 DIAGNOSIS — K581 Irritable bowel syndrome with constipation: Secondary | ICD-10-CM | POA: Diagnosis not present

## 2024-03-05 DIAGNOSIS — K2100 Gastro-esophageal reflux disease with esophagitis, without bleeding: Secondary | ICD-10-CM | POA: Diagnosis not present

## 2024-03-26 DIAGNOSIS — K449 Diaphragmatic hernia without obstruction or gangrene: Secondary | ICD-10-CM | POA: Diagnosis not present

## 2024-03-26 DIAGNOSIS — R195 Other fecal abnormalities: Secondary | ICD-10-CM | POA: Diagnosis not present

## 2024-03-26 DIAGNOSIS — K219 Gastro-esophageal reflux disease without esophagitis: Secondary | ICD-10-CM | POA: Diagnosis not present

## 2024-03-26 DIAGNOSIS — K922 Gastrointestinal hemorrhage, unspecified: Secondary | ICD-10-CM | POA: Diagnosis not present

## 2024-03-26 DIAGNOSIS — K222 Esophageal obstruction: Secondary | ICD-10-CM | POA: Diagnosis not present
